# Patient Record
Sex: Female | Born: 1960
Health system: Southern US, Community
[De-identification: ages and names within clinical notes are randomized; demographics above are authoritative.]

## PROBLEM LIST (undated history)

## (undated) DIAGNOSIS — E559 Vitamin D deficiency, unspecified: Secondary | ICD-10-CM

## (undated) DIAGNOSIS — K589 Irritable bowel syndrome without diarrhea: Secondary | ICD-10-CM

## (undated) DIAGNOSIS — N200 Calculus of kidney: Secondary | ICD-10-CM

## (undated) DIAGNOSIS — M2550 Pain in unspecified joint: Secondary | ICD-10-CM

## (undated) DIAGNOSIS — Z87442 Personal history of urinary calculi: Secondary | ICD-10-CM

## (undated) DIAGNOSIS — M1990 Unspecified osteoarthritis, unspecified site: Secondary | ICD-10-CM

## (undated) DIAGNOSIS — R51 Headache: Secondary | ICD-10-CM

## (undated) DIAGNOSIS — F419 Anxiety disorder, unspecified: Secondary | ICD-10-CM

## (undated) DIAGNOSIS — I10 Essential (primary) hypertension: Secondary | ICD-10-CM

## (undated) DIAGNOSIS — Z860100 Personal history of colon polyps, unspecified: Secondary | ICD-10-CM

## (undated) DIAGNOSIS — R519 Headache, unspecified: Secondary | ICD-10-CM

## (undated) DIAGNOSIS — R42 Dizziness and giddiness: Secondary | ICD-10-CM

## (undated) DIAGNOSIS — K5900 Constipation, unspecified: Secondary | ICD-10-CM

## (undated) DIAGNOSIS — G4730 Sleep apnea, unspecified: Secondary | ICD-10-CM

## (undated) DIAGNOSIS — E7800 Pure hypercholesterolemia, unspecified: Secondary | ICD-10-CM

## (undated) DIAGNOSIS — K760 Fatty (change of) liver, not elsewhere classified: Secondary | ICD-10-CM

## (undated) DIAGNOSIS — R600 Localized edema: Secondary | ICD-10-CM

## (undated) DIAGNOSIS — N201 Calculus of ureter: Secondary | ICD-10-CM

## (undated) DIAGNOSIS — Z8719 Personal history of other diseases of the digestive system: Secondary | ICD-10-CM

## (undated) DIAGNOSIS — K219 Gastro-esophageal reflux disease without esophagitis: Secondary | ICD-10-CM

## (undated) HISTORY — DX: Calculus of kidney: N20.0

## (undated) HISTORY — DX: Constipation, unspecified: K59.00

## (undated) HISTORY — DX: Irritable bowel syndrome, unspecified: K58.9

## (undated) HISTORY — DX: Unspecified osteoarthritis, unspecified site: M19.90

## (undated) HISTORY — DX: Pure hypercholesterolemia, unspecified: E78.00

## (undated) HISTORY — DX: Pain in unspecified joint: M25.50

## (undated) HISTORY — PX: TUBAL LIGATION: SHX77

## (undated) HISTORY — DX: Vitamin D deficiency, unspecified: E55.9

## (undated) HISTORY — DX: Fatty (change of) liver, not elsewhere classified: K76.0

## (undated) HISTORY — DX: Dizziness and giddiness: R42

## (undated) HISTORY — DX: Anxiety disorder, unspecified: F41.9

## (undated) HISTORY — DX: Essential (primary) hypertension: I10

---
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
## 1984-06-04 HISTORY — PX: DILATION AND CURETTAGE OF UTERUS: SHX78

## 2002-08-14 ENCOUNTER — Encounter: Payer: Self-pay | Admitting: Family Medicine

## 2002-08-14 ENCOUNTER — Ambulatory Visit (HOSPITAL_COMMUNITY): Admission: RE | Admit: 2002-08-14 | Discharge: 2002-08-14 | Payer: Self-pay | Admitting: Family Medicine

## 2003-05-05 ENCOUNTER — Ambulatory Visit (HOSPITAL_COMMUNITY): Admission: RE | Admit: 2003-05-05 | Discharge: 2003-05-05 | Payer: Self-pay | Admitting: Obstetrics and Gynecology

## 2004-01-25 ENCOUNTER — Ambulatory Visit (HOSPITAL_COMMUNITY): Admission: RE | Admit: 2004-01-25 | Discharge: 2004-01-25 | Payer: Self-pay | Admitting: Obstetrics and Gynecology

## 2004-11-08 ENCOUNTER — Ambulatory Visit (HOSPITAL_COMMUNITY): Admission: RE | Admit: 2004-11-08 | Discharge: 2004-11-08 | Payer: Self-pay | Admitting: Obstetrics and Gynecology

## 2005-05-11 ENCOUNTER — Other Ambulatory Visit: Admission: RE | Admit: 2005-05-11 | Discharge: 2005-05-11 | Payer: Self-pay | Admitting: Obstetrics and Gynecology

## 2005-12-31 ENCOUNTER — Ambulatory Visit: Payer: Self-pay | Admitting: Internal Medicine

## 2006-03-14 ENCOUNTER — Ambulatory Visit: Payer: Self-pay | Admitting: Internal Medicine

## 2006-05-13 ENCOUNTER — Ambulatory Visit (HOSPITAL_COMMUNITY): Admission: RE | Admit: 2006-05-13 | Discharge: 2006-05-13 | Payer: Self-pay | Admitting: Obstetrics and Gynecology

## 2006-07-09 ENCOUNTER — Encounter: Payer: Self-pay | Admitting: Internal Medicine

## 2006-07-09 DIAGNOSIS — N200 Calculus of kidney: Secondary | ICD-10-CM | POA: Insufficient documentation

## 2011-03-05 ENCOUNTER — Encounter: Payer: Self-pay | Admitting: Cardiology

## 2011-03-26 ENCOUNTER — Encounter: Payer: Self-pay | Admitting: Cardiology

## 2011-04-03 ENCOUNTER — Encounter: Payer: Self-pay | Admitting: Cardiology

## 2011-04-05 ENCOUNTER — Ambulatory Visit (INDEPENDENT_AMBULATORY_CARE_PROVIDER_SITE_OTHER): Payer: 59 | Admitting: Cardiology

## 2011-04-05 ENCOUNTER — Encounter: Payer: Self-pay | Admitting: Cardiology

## 2011-04-05 DIAGNOSIS — J449 Chronic obstructive pulmonary disease, unspecified: Secondary | ICD-10-CM

## 2011-04-05 DIAGNOSIS — Z720 Tobacco use: Secondary | ICD-10-CM

## 2011-04-05 DIAGNOSIS — R0602 Shortness of breath: Secondary | ICD-10-CM

## 2011-04-05 DIAGNOSIS — N200 Calculus of kidney: Secondary | ICD-10-CM

## 2011-04-05 DIAGNOSIS — Z0189 Encounter for other specified special examinations: Secondary | ICD-10-CM

## 2011-04-05 DIAGNOSIS — F319 Bipolar disorder, unspecified: Secondary | ICD-10-CM

## 2011-04-05 DIAGNOSIS — K8020 Calculus of gallbladder without cholecystitis without obstruction: Secondary | ICD-10-CM | POA: Insufficient documentation

## 2011-04-05 DIAGNOSIS — R0609 Other forms of dyspnea: Secondary | ICD-10-CM | POA: Insufficient documentation

## 2011-04-05 DIAGNOSIS — D649 Anemia, unspecified: Secondary | ICD-10-CM

## 2011-04-05 DIAGNOSIS — F17200 Nicotine dependence, unspecified, uncomplicated: Secondary | ICD-10-CM

## 2011-04-05 DIAGNOSIS — I10 Essential (primary) hypertension: Secondary | ICD-10-CM | POA: Insufficient documentation

## 2011-04-05 NOTE — Progress Notes (Signed)
HPI: Ms. Nielson is evaluated in the office today at the kind request of Dr. Sherril Croon for evaluation of dyspnea and echocardiographic evidence of pulmonary hypertension.  This nice woman has enjoyed generally excellent health.  She was diagnosed with mild hypertension some years ago, which has been well controlled with diuretic therapy alone.  She's had some pedal edema but no orthopnea nor PND.  In recent months, she has reported progressive dyspnea on exertion, a component of which was pre-existing.  She is able to do her housework, but notes some dyspnea with yard work or one flight of stairs.  A recent echocardiogram suggested mild pulmonary hypertension, but no other right heart abnormalities.  Abdominal ultrasound has demonstrated the presence of cholelithiasis, nephrolithiasis and hepatosteatosis.   Menopause occurred approximately 6 years ago.  Mild vasomotor symptoms have improved since then without treatment.  Current Outpatient Prescriptions on File Prior to Visit  Medication Sig Dispense Refill  . Multiple Vitamin (MULTIVITAMIN) tablet Take 1 tablet by mouth daily.          No Known Allergies    Past Medical History  Diagnosis Date  . Nephrolithiasis   . Cholelithiasis   . Dyspnea on exertion     lower extremity edema  . Hypertension 2006    onset in 2006; controlled with single drug     Past Surgical History  Procedure Date  . Cesarean section   . Tubal ligation   . Tonsillectomy      No family history on file.   History   Social History  . Marital Status: Single    Spouse Name: N/A    Number of Children: 2  . Years of Education: N/A   Occupational History  . 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 Lionel December   Social History Main Topics  . Smoking status: Former Smoker -- 0.3 packs/day for 3 years  . Smokeless tobacco: Never Used  . Alcohol Use: 0.5 oz/week    1 drink(s) per week  . Drug Use: Not on file  . Sexually Active: Not on file   Other Topics  Concern  . Not on file   Social History Narrative  . No narrative on file     ROS: Intermittent headaches with mild dizziness; corrective lenses required; occasional palpitations; long-standing obesity with weight as high as 260, subsequently falling to 190 and now back to 250.  Mild arthritic discomfort in the right knee; intermittent ankle edema.     All other systems reviewed and are negative.  PHYSICAL EXAM: BP 132/85  Pulse 76  Resp 18  Ht 5\' 7"  (1.702 m)  Wt 114.306 kg (252 lb)  BMI 39.47 kg/m2  General-Well-developed; no acute distress Body Habitus-Overweight HEENT-[place]/AT; PERRL; EOM intact; conjunctiva and lids nl Neck-No JVD; no carotid bruits Endocrine-mild thyromegaly Lungs-Clear lung fields; resonant percussion; normal I-to-E ratio Cardiovascular- normal PMI; normal S1 and S2 Abdomen-BS normal; soft and non-tender without masses or organomegaly Musculoskeletal-No deformities, cyanosis or clubbing Neurologic-Nl cranial nerves; symmetric strength and tone Skin- Warm, no significant lesions Extremities-Nl distal pulses; trace edema  EKG:  Tracing obtained 02/23/11: Normal sinus rhythm; low voltage in the chest leads; otherwise normal.  ASSESSMENT AND PLAN:

## 2011-04-05 NOTE — Assessment & Plan Note (Addendum)
Echocardiogram in 03/2011 by [facility]-technically difficult, mild LVH, normal EF, normal RA and RV, mild pulmonary hypertension-38 mmHg EKG performed 02/23/11: normal sinus rhythm, low voltage in the chest leads, otherwise normal.  Event recorder-no arrhythmias  Echocardiogram suggests only mild pulmonary hypertension by Doppler without additional imaging findings indicating increased right heart pressure.  She has no apparent cause for pulmonary hypertension other than excessive weight and no physical or EKG  findings to suggest that diagnosis.  Exercise tolerance is only modestly impaired, most likely as the result of her weight and physical deconditioning.  Patient has been told that she snores rarely, and her husband has noted no disordered breathing.  Basic laboratory studies including a TSH and BNP level will be obtained and echocardiographic images requested to permit review.  We will locate her most recent chest x-ray.  I will reassess this nice woman 3 weeks.  Until then, she is advised to continue to restrict calories and to exercise as tolerated.

## 2011-04-05 NOTE — Patient Instructions (Signed)
Your physician recommends that you schedule a follow-up appointment in: 3 weeks with Dr Dietrich Pates  Your physician recommends that you return for lab work in: Tomorrow (CMET, CBC, ESR, Fasting Lipids)  Begin an exercise regimen

## 2011-04-06 ENCOUNTER — Ambulatory Visit: Payer: Self-pay | Admitting: Cardiology

## 2011-04-07 NOTE — Assessment & Plan Note (Signed)
onset in 2006; adequately controlled with a single drug

## 2011-04-10 LAB — CBC
HCT: 44.9 % (ref 36.0–46.0)
Hemoglobin: 14.7 g/dL (ref 12.0–15.0)
MCH: 29.9 pg (ref 26.0–34.0)
MCHC: 32.7 g/dL (ref 30.0–36.0)
MCV: 91.4 fL (ref 78.0–100.0)
Platelets: 195 10*3/uL (ref 150–400)
RBC: 4.91 MIL/uL (ref 3.87–5.11)
RDW: 13.3 % (ref 11.5–15.5)
WBC: 4.5 10*3/uL (ref 4.0–10.5)

## 2011-04-10 LAB — BRAIN NATRIURETIC PEPTIDE: Brain Natriuretic Peptide: 44.4 pg/mL (ref 0.0–100.0)

## 2011-04-10 LAB — COMPREHENSIVE METABOLIC PANEL
ALT: 30 U/L (ref 0–35)
AST: 21 U/L (ref 0–37)
Albumin: 4.4 g/dL (ref 3.5–5.2)
Alkaline Phosphatase: 67 U/L (ref 39–117)
BUN: 12 mg/dL (ref 6–23)
CO2: 25 mEq/L (ref 19–32)
Calcium: 9.2 mg/dL (ref 8.4–10.5)
Chloride: 108 mEq/L (ref 96–112)
Creat: 0.77 mg/dL (ref 0.50–1.10)
Glucose, Bld: 104 mg/dL — ABNORMAL HIGH (ref 70–99)
Potassium: 4.4 mEq/L (ref 3.5–5.3)
Sodium: 142 mEq/L (ref 135–145)
Total Bilirubin: 0.4 mg/dL (ref 0.3–1.2)
Total Protein: 6.4 g/dL (ref 6.0–8.3)

## 2011-04-10 LAB — LIPID PANEL
Cholesterol: 136 mg/dL (ref 0–200)
HDL: 40 mg/dL (ref >39)
LDL Cholesterol: 82 mg/dL (ref 0–99)
Total CHOL/HDL Ratio: 3.4 Ratio
Triglycerides: 72 mg/dL (ref <150)
VLDL: 14 mg/dL (ref 0–40)

## 2011-04-10 LAB — SEDIMENTATION RATE: Sed Rate: 4 mm/hr (ref 0–22)

## 2011-04-10 LAB — TSH: TSH: 2.141 u[IU]/mL (ref 0.350–4.500)

## 2011-04-21 ENCOUNTER — Encounter: Payer: Self-pay | Admitting: Cardiology

## 2011-04-23 ENCOUNTER — Other Ambulatory Visit (INDEPENDENT_AMBULATORY_CARE_PROVIDER_SITE_OTHER): Payer: Self-pay | Admitting: *Deleted

## 2011-04-23 DIAGNOSIS — Z1211 Encounter for screening for malignant neoplasm of colon: Secondary | ICD-10-CM

## 2011-04-23 DIAGNOSIS — K219 Gastro-esophageal reflux disease without esophagitis: Secondary | ICD-10-CM

## 2011-04-23 DIAGNOSIS — R1013 Epigastric pain: Secondary | ICD-10-CM

## 2011-04-30 ENCOUNTER — Ambulatory Visit (INDEPENDENT_AMBULATORY_CARE_PROVIDER_SITE_OTHER): Payer: 59 | Admitting: Cardiology

## 2011-04-30 ENCOUNTER — Encounter: Payer: Self-pay | Admitting: Cardiology

## 2011-04-30 ENCOUNTER — Encounter (INDEPENDENT_AMBULATORY_CARE_PROVIDER_SITE_OTHER): Payer: Self-pay | Admitting: *Deleted

## 2011-04-30 ENCOUNTER — Telehealth (INDEPENDENT_AMBULATORY_CARE_PROVIDER_SITE_OTHER): Payer: Self-pay | Admitting: *Deleted

## 2011-04-30 DIAGNOSIS — E8881 Metabolic syndrome: Secondary | ICD-10-CM

## 2011-04-30 DIAGNOSIS — I10 Essential (primary) hypertension: Secondary | ICD-10-CM

## 2011-04-30 NOTE — Progress Notes (Signed)
Patient ID: Marilyn Martin, female   DOB: Apr 14, 1961, 50 y.o.   MRN: [id number] HPI: Marilyn Martin returns to the office as scheduled for continued assessment and treatment of peripheral edema and dyspnea on exertion.  Since her last visit, she has done extremely well.  She joined [employer] and has lost more than 10 pounds.  She has increased exercise to some extent and notes some decrease in exertional dyspnea.  Peripheral edema has been stable, requiring use of furosemide nearly daily.  Prior to Admission medications   Medication Sig Start Date End Date Taking? Authorizing Provider  furosemide (LASIX) 20 MG tablet Take 20 mg by mouth as needed.     Yes Historical Provider, MD  Multiple Vitamin (MULTIVITAMIN) tablet Take 1 tablet by mouth daily.     Yes Historical Provider, MD  triamterene-hydrochlorothiazide (DYAZIDE) 37.5-25 MG per capsule Take 1 capsule by mouth every morning.     Yes Historical Provider, MD  No Known Allergies    Past medical history, social history, and family history reviewed and updated.  ROS: Denies orthopnea and PND.  No cough nor sputum production.  No lightheadedness nor syncope.  PHYSICAL EXAM: BP 122/80  Pulse 80  Ht 5\' 7"  (1.702 m)  Wt 110.224 kg (243 lb)  BMI 38.06 kg/m2  General-Well developed; no acute distress Body habitus-overweight Neck-No JVD; no carotid bruits Lungs-clear lung fields; resonant to percussion Cardiovascular-normal PMI; normal S1 and S2 Abdomen-normal bowel sounds; soft and non-tender without masses or organomegaly Musculoskeletal-No deformities, no cyanosis or clubbing Neurologic-Normal cranial nerves; symmetric strength and tone Skin-Warm, no significant lesions Extremities-distal pulses intact; 1/2+ ankle edema on the right; trace on the left.  ASSESSMENT AND PLAN:  Marilyn Bing, MD 04/30/2011 11:58 AM  And hehe is in in his in her heris aheis is ishe he E.

## 2011-04-30 NOTE — Telephone Encounter (Signed)
PCP/Requesting MD:  Sherril Croon  Name: Marilyn Martin  DOB: [birth year]  Home Phone: [phone]      Procedure: TCS  Reason/Indication:  SCREENING, + FH CRC  Has patient had this procedure before?  NO  If so, when, by whom and where?    Is there a family history of colon cancer?  YES, GRANDFATHER, UNCLE  Who?  What age when diagnosed?    Is patient diabetic?   NO      Does patient have prosthetic heart valve?  NO  Do you have a pacemaker?  NO  Has patient had joint replacement within last 12 months?  NO  Is patient on Coumadin, Plavix and/or Aspirin? NO  Medications: FUROSEMIDE 20 MG DAILY PRN, MULTI VIT DAILY, DYAZIDE 37.5/25 MG DAILY  Allergies: NKDA  Pharmacy:   Medication Adjustment: NONE  Procedure date & time: 05/18/11 @ 7:30

## 2011-04-30 NOTE — Assessment & Plan Note (Addendum)
Although lipids are excellent, with obesity, hypertension and hyperglycemia, she meets criteria for metabolic syndrome.  It is likely that this will resolve should she continue to be successful in her weight reduction efforts.  Pedal edema is likely related to venous insufficiency and obesity.  Edema has been present for some time; a negative venous ultrasound study was performed approximately 2 years ago.  Leg elevation and salt restriction recommended.  Process appears too mild at present to justify the use of prescription compression stockings.

## 2011-04-30 NOTE — Patient Instructions (Signed)
Your physician recommends that you schedule a follow-up appointment in: 6 months  Your physician recommends that you continue weight watchers and increase your exercise.

## 2011-04-30 NOTE — Assessment & Plan Note (Addendum)
Patient has improved symptomatically with weight loss and increased exercise.  She is congratulated on her efforts to date and encouraged to continue to restrict caloric intake while increasing her activity.  Although her echocardiogram is suggestive of mild pulmonary hypertension, this may well be due to obesity.  Additional testing is not likely to be particularly helpful at the present time.  Once weight stabilizes, an arterial blood gas, PFTs and even a right heart catheterization may be appropriate.

## 2011-04-30 NOTE — Assessment & Plan Note (Signed)
Blood pressure control is excellent with diuretic therapy alone.  Recent metabolic profile was normal except for slightly elevated glucose.  She will require monitoring of electrolytes and renal function at least twice a year.

## 2011-05-01 ENCOUNTER — Encounter: Payer: Self-pay | Admitting: Cardiology

## 2011-05-01 NOTE — Telephone Encounter (Signed)
Agree with colonoscopy.

## 2011-05-14 ENCOUNTER — Encounter (HOSPITAL_COMMUNITY): Payer: Self-pay | Admitting: Pharmacy Technician

## 2011-05-17 MED ORDER — SODIUM CHLORIDE 0.45 % IV SOLN: Freq: Once | INTRAVENOUS | Status: DC

## 2011-05-18 ENCOUNTER — Encounter (HOSPITAL_COMMUNITY): Admission: RE | Payer: Self-pay | Source: Ambulatory Visit

## 2011-05-18 ENCOUNTER — Ambulatory Visit (HOSPITAL_COMMUNITY): Admission: RE | Admit: 2011-05-18 | Payer: 59 | Source: Ambulatory Visit | Admitting: Internal Medicine

## 2011-05-18 SURGERY — COLONOSCOPY WITH ESOPHAGOGASTRODUODENOSCOPY (EGD)
Anesthesia: Moderate Sedation

## 2012-03-12 ENCOUNTER — Other Ambulatory Visit (INDEPENDENT_AMBULATORY_CARE_PROVIDER_SITE_OTHER): Payer: Self-pay | Admitting: *Deleted

## 2012-03-12 DIAGNOSIS — Z1211 Encounter for screening for malignant neoplasm of colon: Secondary | ICD-10-CM

## 2012-03-12 DIAGNOSIS — R12 Heartburn: Secondary | ICD-10-CM

## 2012-03-12 DIAGNOSIS — R1310 Dysphagia, unspecified: Secondary | ICD-10-CM

## 2012-03-12 DIAGNOSIS — R1013 Epigastric pain: Secondary | ICD-10-CM

## 2012-04-08 ENCOUNTER — Telehealth (INDEPENDENT_AMBULATORY_CARE_PROVIDER_SITE_OTHER): Payer: Self-pay | Admitting: *Deleted

## 2012-04-08 ENCOUNTER — Encounter (INDEPENDENT_AMBULATORY_CARE_PROVIDER_SITE_OTHER): Payer: Self-pay | Admitting: *Deleted

## 2012-04-08 NOTE — Telephone Encounter (Signed)
agree

## 2012-04-08 NOTE — Telephone Encounter (Signed)
PCP/Requesting MD: vyas  Name & DOB: aneesah hernan Sep 22, 1960   Procedure: tcs/egd/ed  Reason/Indication:  Screening, epigastric pain, heartburn, dysphagia  Has patient had this procedure before?  no  If so, when, by whom and where?    Is there a family history of colon cancer?    Who?  What age when diagnosed?    Is patient diabetic?   no      Does patient have prosthetic heart valve?  no  Do you have a pacemaker?  no  Has patient had joint replacement within last 12 months?  no  Is patient on Coumadin, Plavix and/or Aspirin? no  Medications: lasix 20 mg daily, triamterene/hctz 37.5/25 mg daily, multi vit  Allergies:see EPIC  Medication Adjustment:   Procedure date & time: 04/25/12 at 730

## 2012-04-14 ENCOUNTER — Encounter (HOSPITAL_COMMUNITY): Payer: Self-pay | Admitting: Pharmacy Technician

## 2012-04-25 ENCOUNTER — Encounter (HOSPITAL_COMMUNITY)
Admission: RE | Disposition: A | Discharge status: Home / Self Care | Payer: Self-pay | Source: Ambulatory Visit | Attending: Internal Medicine

## 2012-04-25 ENCOUNTER — Encounter (HOSPITAL_COMMUNITY): Payer: Self-pay | Admitting: *Deleted

## 2012-04-25 ENCOUNTER — Ambulatory Visit (HOSPITAL_COMMUNITY)
Admission: RE | Admit: 2012-04-25 | Discharge: 2012-04-25 | Disposition: A | Discharge status: Home / Self Care | Payer: 59 | Source: Ambulatory Visit | Attending: Internal Medicine | Admitting: Internal Medicine

## 2012-04-25 DIAGNOSIS — R1013 Epigastric pain: Secondary | ICD-10-CM

## 2012-04-25 DIAGNOSIS — K5730 Diverticulosis of large intestine without perforation or abscess without bleeding: Secondary | ICD-10-CM | POA: Insufficient documentation

## 2012-04-25 DIAGNOSIS — Z8719 Personal history of other diseases of the digestive system: Secondary | ICD-10-CM

## 2012-04-25 DIAGNOSIS — Z1211 Encounter for screening for malignant neoplasm of colon: Secondary | ICD-10-CM

## 2012-04-25 DIAGNOSIS — K644 Residual hemorrhoidal skin tags: Secondary | ICD-10-CM | POA: Insufficient documentation

## 2012-04-25 DIAGNOSIS — K2990 Gastroduodenitis, unspecified, without bleeding: Secondary | ICD-10-CM

## 2012-04-25 DIAGNOSIS — K6389 Other specified diseases of intestine: Secondary | ICD-10-CM

## 2012-04-25 DIAGNOSIS — R1310 Dysphagia, unspecified: Secondary | ICD-10-CM

## 2012-04-25 DIAGNOSIS — R12 Heartburn: Secondary | ICD-10-CM

## 2012-04-25 DIAGNOSIS — K2970 Gastritis, unspecified, without bleeding: Secondary | ICD-10-CM | POA: Insufficient documentation

## 2012-04-25 DIAGNOSIS — D126 Benign neoplasm of colon, unspecified: Secondary | ICD-10-CM

## 2012-04-25 DIAGNOSIS — I10 Essential (primary) hypertension: Secondary | ICD-10-CM | POA: Insufficient documentation

## 2012-04-25 DIAGNOSIS — K2100 Gastro-esophageal reflux disease with esophagitis, without bleeding: Secondary | ICD-10-CM | POA: Insufficient documentation

## 2012-04-25 DIAGNOSIS — K259 Gastric ulcer, unspecified as acute or chronic, without hemorrhage or perforation: Secondary | ICD-10-CM

## 2012-04-25 HISTORY — PX: MALONEY DILATION: SHX5535

## 2012-04-25 HISTORY — PX: COLONOSCOPY WITH ESOPHAGOGASTRODUODENOSCOPY (EGD): SHX5779

## 2012-04-25 HISTORY — DX: Gastro-esophageal reflux disease without esophagitis: K21.9

## 2012-04-25 SURGERY — COLONOSCOPY WITH ESOPHAGOGASTRODUODENOSCOPY (EGD)
Anesthesia: Moderate Sedation

## 2012-04-25 MED ORDER — PANTOPRAZOLE SODIUM 40 MG PO TBEC: 40.0000 mg | DELAYED_RELEASE_TABLET | Freq: Every day | ORAL | Status: DC

## 2012-04-25 MED ORDER — MIDAZOLAM HCL 5 MG/5ML IJ SOLN
INTRAMUSCULAR | Status: DC | PRN
  Administered 2012-04-25 (×2): 2 mg via INTRAVENOUS
  Administered 2012-04-25 (×2): 1 mg via INTRAVENOUS
  Administered 2012-04-25 (×2): 2 mg via INTRAVENOUS

## 2012-04-25 MED ORDER — BUTAMBEN-TETRACAINE-BENZOCAINE 2-2-14 % EX AERO
INHALATION_SPRAY | CUTANEOUS | Status: DC | PRN
  Administered 2012-04-25: 2 via TOPICAL

## 2012-04-25 MED ORDER — MEPERIDINE HCL 50 MG/ML IJ SOLN
INTRAMUSCULAR | Status: AC
  Filled 2012-04-25: qty 1

## 2012-04-25 MED ORDER — STERILE WATER FOR IRRIGATION IR SOLN
Status: DC | PRN
  Administered 2012-04-25: 08:00:00

## 2012-04-25 MED ORDER — MEPERIDINE HCL 50 MG/ML IJ SOLN
INTRAMUSCULAR | Status: DC | PRN
  Administered 2012-04-25 (×2): 25 mg via INTRAVENOUS

## 2012-04-25 MED ORDER — MIDAZOLAM HCL 5 MG/5ML IJ SOLN
INTRAMUSCULAR | Status: AC
  Filled 2012-04-25: qty 10

## 2012-04-25 MED ORDER — SODIUM CHLORIDE 0.45 % IV SOLN
INTRAVENOUS | Status: DC
  Administered 2012-04-25: 07:00:00 via INTRAVENOUS

## 2012-04-25 NOTE — H&P (Addendum)
Marilyn Martin is an 51 y.o. female.   Chief Complaint: Patient is here for EGD, ED and colonoscopy. HPI: Patient is 51 year-old Caucasian female who presents with one-year history of intermittent dysphagia to solids and heartburn 2-3 times a week. She has good appetite. She denies nausea vomiting or melena but has intermittent epigastric pain. She is also undergoing screening colonoscopy. She has intermittent constipation; no history of rectal bleeding. Family history significant for colon carcinoma paternal uncle who was in his 52s.  Past Medical History  Diagnosis Date  . Nephrolithiasis   . Cholelithiasis   . Dyspnea on exertion     lower extremity edema  . Hypertension 2006    onset in 2006; controlled with single drug  . GERD (gastroesophageal reflux disease)     Past Surgical History  Procedure Date  . Cesarean section   . Tubal ligation   . Tonsillectomy     Family History  Problem Relation Age of Onset  . Stroke Father   . Heart attack Father   . Alzheimer's disease Mother   . Diabetes Brother   . Alzheimer's disease Sister   . Colon cancer Paternal Uncle    Social History:  reports that she has quit smoking. Her smoking use included Cigarettes. She has a 2 pack-year smoking history. She has never used smokeless tobacco. She reports that she drinks alcohol. She reports that she does not use illicit drugs.  Allergies:  Allergies  Allergen Reactions  . Bee Venom Swelling    Medications Prior to Admission  Medication Sig Dispense Refill  . acetaminophen (TYLENOL) 500 MG tablet Take 1,000 mg by mouth every 6 (six) hours as needed. For pain/fever       . Cholecalciferol (VITAMIN D) 2000 UNITS tablet Take 2,000 Units by mouth 3 (three) times a week.      . estradiol-norethindrone (COMBIPATCH) 0.05-0.14 MG/DAY Place 1 patch onto the skin 2 (two) times a week.      . furosemide (LASIX) 20 MG tablet Take 20 mg by mouth daily.       [name] Kitchen loratadine (CLARITIN) 10 MG tablet  Take 10 mg by mouth daily.      . Multiple Vitamins-Minerals (MULTIVITAMINS THER. W/MINERALS) TABS Take 1 tablet by mouth daily.        [name] Kitchen triamterene-hydrochlorothiazide (DYAZIDE) 37.5-25 MG per capsule Take 1 capsule by mouth every morning.          No results found for this or any previous visit (from the past 48 hour(s)). No results found.  ROS  Blood pressure 141/92, pulse 91, temperature 98.4 F (36.9 C), temperature source Oral, resp. rate 20, height 5\' 7"  (1.702 m), weight 250 lb (113.399 kg), SpO2 97.00%. Physical Exam  Constitutional: She appears well-developed and well-nourished.  HENT:  Mouth/Throat: Oropharynx is clear and moist.  Eyes: Conjunctivae normal are normal. No scleral icterus.  Neck: No thyromegaly present.  Cardiovascular: Normal rate, regular rhythm and normal heart sounds.   No murmur heard. Respiratory: Effort normal.  GI: Soft. She exhibits no distension and no mass. There is no tenderness.  Musculoskeletal: Edema: trace edema around ankles.  Lymphadenopathy:    She has no cervical adenopathy.  Skin: Skin is warm and dry.     Assessment/Plan Solid food dysphagia. EGD, ED and average risk screening colonoscopy  Marilyn Martin 04/25/2012, 7:32 AM

## 2012-04-25 NOTE — Op Note (Addendum)
EGD, ED AND  COLONOSCOPY  PROCEDURE REPORT  PATIENT:  Marilyn Martin  MR#:  [id number] Birthdate:  1961-03-02, 51 y.o., female Endoscopist:  Dr. Malissa Hippo, MD Referred By:  Dr. Ignatius Specking, MD Procedure Date: 04/25/2012  Procedure:   EGD, ED & Colonoscopy  Indications:  Patient is 51 year old Caucasian female who presents with intermittent heartburn and solid food dysphagia. She is also undergoing screening colonoscopy.            Informed Consent:  The risks, benefits, alternatives & imponderables which include, but are not limited to, bleeding, infection, perforation, drug reaction and potential missed lesion have been reviewed.  The potential for biopsy, lesion removal, esophageal dilation, etc. have also been discussed.  Questions have been answered.  All parties agreeable.  Please see history & physical in medical record for more information.  Medications:  Demerol 50 mg IV Versed 10 mg IV Cetacaine spray topically for oropharyngeal anesthesia  EGD  Description of procedure:  The endoscope was introduced through the mouth and advanced to the second portion of the duodenum without difficulty or limitations. The mucosal surfaces were surveyed very carefully during advancement of the scope and upon withdrawal.  Findings:  Esophagus:  Mucosa of the esophagus was normal. Single small ulcer noted at GE junction. GEJ:  37 cm Stomach:  Stomach was empty and distended very well with insufflation. Folds in the proximal stomach are normal. Examination mucosa revealed single erosion and gastric body and few more at antrum along with the scar. Pyloric channel was patent. Angularis fundus and cardia were examined by retroflexing the scope and were normal. Duodenum:  Patchy edema and erythema noted at bulb. Post bulbar mucosa was normal.  Therapeutic/Diagnostic Maneuvers Performed:  Esophagus dilated by passing 56 French Maloney dilator to full insertion but no mucosal disruption  noted.  COLONOSCOPY Description of procedure:  After a digital rectal exam was performed, that colonoscope was advanced from the anus through the rectum and colon to the area of the cecum, ileocecal valve and appendiceal orifice. The cecum was deeply intubated. These structures were well-seen and photographed for the record. From the level of the cecum and ileocecal valve, the scope was slowly and cautiously withdrawn. The mucosal surfaces were carefully surveyed utilizing scope tip to flexion to facilitate fold flattening as needed. The scope was pulled down into the rectum where a thorough exam including retroflexion was performed.  Findings:   Prep satisfactory. Two small diverticula noted(transverse and sigmoid colon). Four small polyps ablated via cold biopsy. One was located at splenic flexure and others 3 at sigmoid colon. All of these polyps are submitted together. Normal rectal mucosa. Small hemorrhoids below the dentate line and single anal papilla. Therapeutic/Diagnostic Maneuvers Performed:  See above  Complications:  None  Cecal Withdrawal Time: 17 minutes  Impression:  Single small ulcer noted at GE junction(reflux esophagitis). No evidence of ring or stricture. Esophagus dilated by passing 56 French Maloney dilator given history of dysphagia. Gastroduodenitis and a small antral scar. Two small diverticula noted(transverse and sigmoid colon). Four small polyps ablated via cold biopsy and submitted in one container. Mall external hemorrhoids.  Recommendations:  Anti-reflux measures. Pantoprazole 40 mg by mouth every morning. H. pylori serology.  REHMAN,NAJEEB U  04/25/2012 8:34 AM  CC: Dr. Ignatius Specking., MD & Dr. Bonnetta Barry ref. provider found

## 2012-04-28 ENCOUNTER — Encounter (HOSPITAL_COMMUNITY): Payer: Self-pay | Admitting: Internal Medicine

## 2012-04-28 LAB — H. PYLORI ANTIBODY, IGG: H Pylori IgG: <0.4 {ISR}

## 2012-04-30 ENCOUNTER — Encounter (INDEPENDENT_AMBULATORY_CARE_PROVIDER_SITE_OTHER): Payer: Self-pay | Admitting: *Deleted

## 2012-09-03 ENCOUNTER — Telehealth (INDEPENDENT_AMBULATORY_CARE_PROVIDER_SITE_OTHER): Payer: Self-pay | Admitting: Internal Medicine

## 2012-09-03 MED ORDER — FUROSEMIDE 20 MG PO TABS: 20.0000 mg | ORAL_TABLET | Freq: Every day | ORAL | Status: DC

## 2012-09-03 NOTE — Telephone Encounter (Signed)
Rx sent 

## 2012-12-16 ENCOUNTER — Encounter: Payer: Self-pay | Admitting: Cardiovascular Disease

## 2012-12-16 ENCOUNTER — Ambulatory Visit (INDEPENDENT_AMBULATORY_CARE_PROVIDER_SITE_OTHER): Payer: 59 | Admitting: Cardiovascular Disease

## 2012-12-16 ENCOUNTER — Encounter: Payer: Self-pay | Admitting: *Deleted

## 2012-12-16 VITALS — BP 113/76 | HR 83 | Ht 66.0 in | Wt 265.0 lb

## 2012-12-16 DIAGNOSIS — R079 Chest pain, unspecified: Secondary | ICD-10-CM

## 2012-12-16 DIAGNOSIS — R0602 Shortness of breath: Secondary | ICD-10-CM

## 2012-12-16 DIAGNOSIS — M7989 Other specified soft tissue disorders: Secondary | ICD-10-CM

## 2012-12-16 DIAGNOSIS — I10 Essential (primary) hypertension: Secondary | ICD-10-CM

## 2012-12-16 NOTE — Patient Instructions (Addendum)
Your physician has requested that you have an echocardiogram. Echocardiography is a painless test that uses sound waves to create images of your heart. It provides your doctor with information about the size and shape of your heart and how well your heart's chambers and valves are working. This procedure takes approximately one hour. There are no restrictions for this procedure. Lexiscan cardiolite (stress test) Office will contact with results via phone or letter.   Continue all current medications. Follow up in  6-8 weeks

## 2012-12-16 NOTE — Progress Notes (Signed)
Patient ID: Marilyn Martin, female   DOB: 1960-07-31, 52 y.o.   MRN: [id number]    CARDIOLOGY CONSULT NOTE  Patient ID: Marilyn Martin MRN: [id number] DOB/AGE: [birth year] 52 y.o.  Admit date: (Not on file) Primary Physician: Doreen Beam, MD Reason for Consultation: shortness of breath, leg edema  HPI:  Marilyn Martin is a 52 y.o. Woman with metabolic syndrome, peripheral edema, and chronic dyspnea, deemed secondary to obesity. She has HTN and takes Dyazide.  She's been taking Furosemide for leg swelling, and says her right foot has been more swollen lately. She was told by Dr. Sherril Croon' NP that she was in right-heart failure, and is thus here today.  She denies exertional chest pain, but does have exertional dyspnea and sometimes at rest as well.  She denies ever having had PFT's, stress testing, or right heart catheterization. She did have an Event monitor for palpitations in 2012.  An echocardiogram in October 2012 showed normal LV systolic and diastolic function, mild LVH, mild LAE, and mild pulmonary HTN.  She's never been to a vascular surgeon for venous or lymphatic evaluation.  Her husband denies that she has any significant snoring nor apneic episodes.  Review of systems complete and found to be negative unless listed above in HPI  Past Medical History: See HPI  Family History  Problem Relation Age of Onset  . Stroke Father   . Heart attack Father   . Alzheimer's disease Mother   . Diabetes Brother   . Alzheimer's disease Sister   . Colon cancer Paternal Uncle     History   Social History  . Marital Status: Married    Spouse Name: N/A    Number of Children: 2  . Years of Education: N/A   Occupational History  . 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 Smoking status: Former Smoker -- 0.50 packs/day for 4 years    Types: Cigarettes  . Smokeless tobacco: Never Used  . Alcohol Use: 0.0 oz/week     Comment: Rarely   . Drug Use: No  . Sexually Active: Not on file   Other Topics Concern  . Not on file   Social History Narrative  . No narrative on file      (Not in a hospital admission)  Physical exam Height 5\' 6"  (1.676 m), weight 265 lb (120.203 kg). General: NAD Neck: No JVD, no thyromegaly or thyroid nodule.  Lungs: Clear to auscultation bilaterally with normal respiratory effort. CV: Nondisplaced PMI.  Heart regular S1/S2, no S3/S4, no murmur.  1+ pitting pretibial edema bilaterally.  No carotid bruit.  Normal pedal pulses.  Abdomen: Soft, nontender, no hepatosplenomegaly, no distention.  Skin: Intact without lesions or rashes.  Neurologic: Alert and oriented x 3.  Psych: Normal affect. Extremities: No clubbing or cyanosis.  HEENT: Normal.   Labs:   Lab Results  Component Value Date   WBC 4.5 04/09/2011   HGB 14.7 04/09/2011   HCT 44.9 04/09/2011   MCV 91.4 04/09/2011   PLT 195 04/09/2011   No results found for this basename: NA, K, CL, CO2, BUN, CREATININE, CALCIUM, LABALBU, PROT, BILITOT, ALKPHOS, ALT, AST, GLUCOSE,  in the last 168 hours No results found for this basename: CKTOTAL, CKMB, CKMBINDEX, TROPONINI    Lab Results  Component Value Date   CHOL 136 04/09/2011   Lab Results  Component Value Date   HDL 40 04/09/2011   Lab Results  Component Value Date   LDLCALC 82 04/09/2011   Lab Results  Component Value Date   TRIG 72 04/09/2011   Lab Results  Component Value Date   CHOLHDL 3.4 04/09/2011   No results found for this basename: LDLDIRECT       EKG: Sinus rhythm, rate 89 bpm, axis within normal limits, intervals within normal limits, no acute ST-T wave changes.    ASSESSMENT AND PLAN:  1. Shortness of breath: this may very well be due to deconditioning due to obesity. However, to assess for any interval changes in her pulmonary pressures as well as diastolic function, will proceed with an echocardiogram. Secondly, to make sure we are not overlooking an anginal  equivalent given her metabolic syndrome, will obtain a Lexiscan Cardiolite stress test. If these are unrevealing, I would obtain PFT's to assess for a pulmonary etiology as well as a referral to a vascular surgeon, as her concomitant leg swelling may be due to venous (and/or lymphatic) insufficiency. 2. Leg edema: see #1. 3. HTN: controlled.  Signed: Prentice Docker, M.D., F.A.C.C. 12/16/2012, 8:42 AM

## 2012-12-26 ENCOUNTER — Encounter (HOSPITAL_COMMUNITY): Payer: 59

## 2012-12-26 ENCOUNTER — Encounter (HOSPITAL_COMMUNITY): Payer: Self-pay

## 2012-12-26 ENCOUNTER — Encounter (HOSPITAL_COMMUNITY)
Admission: RE | Admit: 2012-12-26 | Discharge: 2012-12-26 | Disposition: A | Discharge status: Home / Self Care | Payer: 59 | Source: Ambulatory Visit | Attending: Cardiovascular Disease | Admitting: Cardiovascular Disease

## 2012-12-26 ENCOUNTER — Ambulatory Visit (HOSPITAL_COMMUNITY)
Admission: RE | Admit: 2012-12-26 | Discharge: 2012-12-26 | Disposition: A | Discharge status: Home / Self Care | Payer: 59 | Source: Ambulatory Visit | Attending: Cardiovascular Disease | Admitting: Cardiovascular Disease

## 2012-12-26 ENCOUNTER — Other Ambulatory Visit: Payer: Self-pay | Admitting: Cardiovascular Disease

## 2012-12-26 DIAGNOSIS — R079 Chest pain, unspecified: Secondary | ICD-10-CM

## 2012-12-26 DIAGNOSIS — R0602 Shortness of breath: Secondary | ICD-10-CM

## 2012-12-26 DIAGNOSIS — M7989 Other specified soft tissue disorders: Secondary | ICD-10-CM

## 2012-12-26 DIAGNOSIS — I10 Essential (primary) hypertension: Secondary | ICD-10-CM | POA: Insufficient documentation

## 2012-12-26 DIAGNOSIS — R0609 Other forms of dyspnea: Secondary | ICD-10-CM | POA: Insufficient documentation

## 2012-12-26 DIAGNOSIS — R0989 Other specified symptoms and signs involving the circulatory and respiratory systems: Secondary | ICD-10-CM | POA: Insufficient documentation

## 2012-12-26 HISTORY — PX: CARDIOVASCULAR STRESS TEST: SHX262

## 2012-12-26 HISTORY — PX: TRANSTHORACIC ECHOCARDIOGRAM: SHX275

## 2012-12-26 MED ORDER — TECHNETIUM TC 99M SESTAMIBI - CARDIOLITE
10.0000 | Freq: Once | INTRAVENOUS | Status: AC | PRN
  Administered 2012-12-26: 10:00:00 10 via INTRAVENOUS

## 2012-12-26 MED ORDER — TECHNETIUM TC 99M SESTAMIBI - CARDIOLITE
30.0000 | Freq: Once | INTRAVENOUS | Status: AC | PRN
  Administered 2012-12-26: 30 via INTRAVENOUS

## 2012-12-26 NOTE — Progress Notes (Signed)
*  PRELIMINARY RESULTS* Echocardiogram 2D Echocardiogram has been performed.  Marilyn Martin 12/26/2012, 8:51 AM

## 2012-12-26 NOTE — Addendum Note (Signed)
Addended by: Derry Lory A on: 12/26/2012 08:16 AM   Modules accepted: Orders

## 2012-12-26 NOTE — Progress Notes (Signed)
Stress Lab Nurses Notes - [facility]  SHAUNEE MULKERN 12/26/2012  Reason for doing test: Dyspnea  Type of test: Stress Myoview  Nurse performing test: Marlena Clipper, RN  Nuclear Medicine Tech: Lou Cal  Echo Tech: Not Applicable  MD performing test: Margaretmary Lombard MD: Vyas  Test explained and consent signed: yes  IV started: 22g jelco, Saline lock flushed, No redness or edema and Saline lock started in radiology  Symptoms: Fatigue and SOB  Treatment/Intervention: None  Reason test stopped: reached target HR and SOB  After recovery IV was: Discontinued via X-ray tech, No redness or edema and Saline Lock flushed  Patient to return to Nuc. Med at :  Patient discharged: Home  Patient's Condition upon discharge was: stable  Comments: Patient walked 4 minutes and 28 seconds. Her resting HR was 81 and Resting BP was 115/73, Her peak HR was 166 and peak BP was 147/104. Symptoms resolved in recovery.   Angelica Pou

## 2013-01-30 ENCOUNTER — Ambulatory Visit: Payer: 59 | Admitting: Cardiovascular Disease

## 2013-03-06 ENCOUNTER — Ambulatory Visit: Payer: 59 | Admitting: Cardiovascular Disease

## 2013-04-09 ENCOUNTER — Other Ambulatory Visit: Payer: Self-pay

## 2013-07-09 ENCOUNTER — Other Ambulatory Visit (INDEPENDENT_AMBULATORY_CARE_PROVIDER_SITE_OTHER): Payer: Self-pay | Admitting: Internal Medicine

## 2013-07-09 ENCOUNTER — Telehealth (INDEPENDENT_AMBULATORY_CARE_PROVIDER_SITE_OTHER): Payer: Self-pay | Admitting: Internal Medicine

## 2013-07-09 DIAGNOSIS — J329 Chronic sinusitis, unspecified: Secondary | ICD-10-CM

## 2013-07-09 MED ORDER — LEVOFLOXACIN 500 MG PO TABS: 500.0000 mg | ORAL_TABLET | Freq: Every day | ORAL | Status: DC

## 2013-07-09 NOTE — Telephone Encounter (Signed)
Sent to [facility].  

## 2013-07-15 ENCOUNTER — Other Ambulatory Visit (INDEPENDENT_AMBULATORY_CARE_PROVIDER_SITE_OTHER): Payer: Self-pay | Admitting: Internal Medicine

## 2013-07-15 ENCOUNTER — Ambulatory Visit (HOSPITAL_COMMUNITY)
Admission: RE | Admit: 2013-07-15 | Discharge: 2013-07-15 | Disposition: A | Discharge status: Home / Self Care | Payer: 59 | Source: Ambulatory Visit | Attending: Internal Medicine | Admitting: Internal Medicine

## 2013-07-15 ENCOUNTER — Telehealth (INDEPENDENT_AMBULATORY_CARE_PROVIDER_SITE_OTHER): Payer: Self-pay | Admitting: Internal Medicine

## 2013-07-15 DIAGNOSIS — R1011 Right upper quadrant pain: Secondary | ICD-10-CM | POA: Insufficient documentation

## 2013-07-15 DIAGNOSIS — K8020 Calculus of gallbladder without cholecystitis without obstruction: Secondary | ICD-10-CM | POA: Insufficient documentation

## 2013-07-15 DIAGNOSIS — K769 Liver disease, unspecified: Secondary | ICD-10-CM | POA: Insufficient documentation

## 2013-07-15 LAB — HEPATIC FUNCTION PANEL
ALBUMIN: 3 g/dL — AB (ref 3.5–5.2)
ALK PHOS: 76 U/L (ref 39–117)
ALT: 23 U/L (ref 0–35)
AST: 15 U/L (ref 0–37)
Bilirubin, Direct: <0.1 mg/dL (ref 0.0–0.3)
TOTAL PROTEIN: 7.7 g/dL (ref 6.0–8.3)
Total Bilirubin: 0.3 mg/dL (ref 0.3–1.2)

## 2013-07-15 LAB — AMYLASE: Amylase: 30 U/L (ref 0–105)

## 2013-07-15 LAB — LIPASE: Lipase: 34 U/L (ref 11–59)

## 2013-07-15 NOTE — Telephone Encounter (Signed)
C/o rt upper quadrant pain. Nausea.

## 2013-07-28 ENCOUNTER — Encounter (HOSPITAL_COMMUNITY): Payer: Self-pay

## 2013-07-28 ENCOUNTER — Encounter (HOSPITAL_COMMUNITY): Payer: Self-pay | Admitting: Pharmacy Technician

## 2013-07-28 ENCOUNTER — Encounter (HOSPITAL_COMMUNITY)
Admission: RE | Admit: 2013-07-28 | Discharge: 2013-07-28 | Disposition: A | Discharge status: Home / Self Care | Payer: 59 | Source: Ambulatory Visit | Attending: General Surgery | Admitting: General Surgery

## 2013-07-28 LAB — CBC WITH DIFFERENTIAL/PLATELET
BASOS PCT: 1 % (ref 0–1)
Basophils Absolute: 0.1 10*3/uL (ref 0.0–0.1)
EOS ABS: 0.1 10*3/uL (ref 0.0–0.7)
Eosinophils Relative: 1 % (ref 0–5)
HCT: 45.4 % (ref 36.0–46.0)
Hemoglobin: 15.7 g/dL — ABNORMAL HIGH (ref 12.0–15.0)
Lymphocytes Relative: 28 % (ref 12–46)
Lymphs Abs: 2 10*3/uL (ref 0.7–4.0)
MCH: 31.1 pg (ref 26.0–34.0)
MCHC: 34.6 g/dL (ref 30.0–36.0)
MCV: 89.9 fL (ref 78.0–100.0)
MONOS PCT: 8 % (ref 3–12)
Monocytes Absolute: 0.6 10*3/uL (ref 0.1–1.0)
NEUTROS PCT: 62 % (ref 43–77)
Neutro Abs: 4.3 10*3/uL (ref 1.7–7.7)
PLATELETS: 248 10*3/uL (ref 150–400)
RBC: 5.05 MIL/uL (ref 3.87–5.11)
RDW: 13.3 % (ref 11.5–15.5)
WBC: 7 10*3/uL (ref 4.0–10.5)

## 2013-07-28 LAB — BASIC METABOLIC PANEL
BUN: 10 mg/dL (ref 6–23)
CALCIUM: 10.1 mg/dL (ref 8.4–10.5)
CO2: 29 mEq/L (ref 19–32)
CREATININE: 0.88 mg/dL (ref 0.50–1.10)
Chloride: 100 mEq/L (ref 96–112)
GFR calc Af Amer: 85 mL/min — ABNORMAL LOW (ref >90)
GFR, EST NON AFRICAN AMERICAN: 73 mL/min — AB (ref >90)
GLUCOSE: 119 mg/dL — AB (ref 70–99)
Potassium: 4 mEq/L (ref 3.7–5.3)
Sodium: 141 mEq/L (ref 137–147)

## 2013-07-28 NOTE — H&P (Signed)
  NTS SOAP Note  Vital Signs:  Vitals as of: 5/72/6203: Systolic 559: Diastolic 92: Heart Rate 111: Temp 96.33F: Height 14ft 7in: Weight 264Lbs 0 Ounces: BMI 41.35  BMI : 41.35 kg/m2  Subjective: This 53 Years 78 Months old Female presents for of gallstones.  Has been having intermittent right upper quadrant abdominal pain and nausea.  Intermittent over the past few months. No fever, chills, jaundice.  U/S of gallbladder reveals cholelithiasis, normal common bile duct, nodular liver.  Review of Symptoms:  Constitutional:unremarkable   Head:unremarkable    Eyes:unremarkable   Nose/Mouth/Throat:unremarkable Cardiovascular:  unremarkable   Respiratory:unremarkable   Gastrointestin    nausea,heartburn Genitourinary:unremarkable     Musculoskeletal:unremarkable   Skin:unremarkable Hematolgic/Lymphatic:unremarkable     Allergic/Immunologic:unremarkable     Past Medical History:    Reviewed  Past Medical History  Surgical History: BTL, c section, TCS, EGD Medical Problems: HTN, reflux Allergies: lasix, pantopraazole, loratadine, combipatch, triamterene/HCTZ   Social History:Reviewed  Social History  Preferred Language: English Race:  White Ethnicity: Not Hispanic / Latino Age: 53 Years 1 Months Marital Status:  M Alcohol: rarely Recreational drug(s): no   Smoking Status: Never smoker reviewed on 07/28/2013 Functional Status reviewed on 07/28/2013 ------------------------------------------------ Bathing: Normal Cooking: Normal Dressing: Normal Driving: Normal Eating: Normal Managing Meds: Normal Oral Care: Normal Shopping: Normal Toileting: Normal Transferring: Normal Walking: Normal Cognitive Status reviewed on 07/28/2013 ------------------------------------------------ Attention: Normal Decision Making: Normal Language: Normal Memory: Normal Motor: Normal Perception: Normal Problem Solving: Normal Visual and  Spatial: Normal   Family History:  Reviewed  Family Health History Mother, Deceased; Thyroid cancer; Alzheimer's disease;  Father, Deceased; Heart attack (myocardial infarction); Diabetes mellitus, type 2 (adult); Stroke (CVA);     Objective Information: General:  Well appearing, well nourished in no distress.   no scleral icterus Heart:  RRR, no murmur or gallop.  Normal S1, S2.  No S3, S4.  Lungs:    CTA bilaterally, no wheezes, rhonchi, rales.  Breathing unlabored. Abdomen:Soft, slightly tender in right upper quadrant to deep palpation, ND, normal bowel sounds, no HSM, no masses.  No peritoneal signs.  Assessment:Biliary colic, cholelithiasis, ?fatty liver vs cirrhosis  Diagnoses: 574.20 Gallstone (Calculus of gallbladder without cholecystitis without obstruction)  Procedures: 74163 - OFFICE OUTPATIENT NEW 30 MINUTES    Plan:Scheduled for laparoscopic cholecystectomy, liver biopsy on 07/31/13.   Patient Education:Alternative treatments to surgery were discussed with patient (and family).  Risks and benefits  of procedure including bleeding, infection, hepatobiliary injury, and the possibility of an open procedure were fully explained to the patient (and family) who gave informed consent. Patient/family questions were addressed.  Follow-up:Pending Surgery

## 2013-07-28 NOTE — Patient Instructions (Addendum)
Laparoscopic Cholecystectomy Laparoscopic cholecystectomy is surgery to remove the gallbladder. The gallbladder is located in the upper right part of the abdomen, behind the liver. It is a storage sac for bile produced in the liver. Bile aids in the digestion and absorption of fats. Cholecystectomy is often done for inflammation of the gallbladder (cholecystitis). This condition is usually caused by a buildup of gallstones (cholelithiasis) in your gallbladder. Gallstones can block the flow of bile, resulting in inflammation and pain. In severe cases, emergency surgery may be required. When emergency surgery is not required, you will have time to prepare for the procedure. Laparoscopic surgery is an alternative to open surgery. Laparoscopic surgery has a shorter recovery time. Your common bile duct may also need to be examined during the procedure. If stones are found in the common bile duct, they may be removed. LET [facility] CARE PROVIDER KNOW ABOUT:  Any allergies you have.  All medicines you are taking, including vitamins, herbs, eye drops, creams, and over-the-counter medicines.  Previous problems you or members of your family have had with the use of anesthetics.  Any blood disorders you have.  Previous surgeries you have had.  Medical conditions you have. RISKS AND COMPLICATIONS Generally, this is a safe procedure. However, as with any procedure, complications can occur. Possible complications include:  Infection.  Damage to the common bile duct, nerves, arteries, veins, or other internal organs such as the stomach, liver, or intestines.  Bleeding.  A stone may remain in the common bile duct.  A bile leak from the cyst duct that is clipped when your gallbladder is removed.  The need to convert to open surgery, which requires a larger incision in the abdomen. This may be necessary if your surgeon thinks it is not safe to continue with a laparoscopic procedure. BEFORE  THE PROCEDURE  Ask your health care provider about changing or stopping any regular medicines. You will need to stop taking aspirin or blood thinners at least 5 days prior to surgery.  Do not eat or drink anything after midnight the night before surgery.  Let your health care provider know if you develop a cold or other infectious problem before surgery. PROCEDURE   You will be given medicine to make you sleep through the procedure (general anesthetic). A breathing tube will be placed in your mouth.  When you are asleep, your surgeon will make several small cuts (incisions) in your abdomen.  A thin, lighted tube with a tiny camera on the end (laparoscope) is inserted through one of the small incisions. The camera on the laparoscope sends a picture to a TV screen in the operating room. This gives the surgeon a good view inside your abdomen.  A gas will be pumped into your abdomen. This expands your abdomen so that the surgeon has more room to perform the surgery.  Other tools needed for the procedure are inserted through the other incisions. The gallbladder is removed through one of the incisions.  After the removal of your gallbladder, the incisions will be closed with stitches, staples, or skin glue. AFTER THE PROCEDURE  You will be taken to a recovery area where your progress will be checked often.  You may be allowed to go home the same day if your pain is controlled and you can tolerate liquids. Document Released: 05/21/2005 Document Revised: 03/11/2013 Document Reviewed: 12/31/2012 [facility] Patient Information 2014 [place]. PATIENT INSTRUCTIONS POST-ANESTHESIA  IMMEDIATELY FOLLOWING SURGERY:  Do not drive or  operate machinery for the first twenty four hours after surgery.  Do not make any important decisions for twenty four hours after surgery or while taking narcotic pain medications or sedatives.  If you develop intractable nausea and vomiting or a severe headache please  notify your doctor immediately.  FOLLOW-UP:  Please make an appointment with your surgeon as instructed. You do not need to follow up with anesthesia unless specifically instructed to do so.  WOUND CARE INSTRUCTIONS (if applicable):  Keep a dry clean dressing on the anesthesia/puncture wound site if there is drainage.  Once the wound has quit draining you may leave it open to air.  Generally you should leave the bandage intact for twenty four hours unless there is drainage.  If the epidural site drains for more than 36-48 hours please call the anesthesia department.  QUESTIONS?:  Please feel free to call your physician or the hospital operator if you have any questions, and they will be happy to assist you.        Your procedure is scheduled on: 07/31/2013  Report to [facility] at  33  AM.  Call this number if you have problems the morning of surgery: [phone]   Remember:   Do not drink or eat food:After Midnight.  :  Take these medicines the morning of surgery with A SIP OF WATER: claritin, protonix, dyazide   Do not wear jewelry, make-up or nail polish.  Do not wear lotions, powders, or perfumes.   Do not shave 48 hours prior to surgery. Men may shave face and neck.  Do not bring valuables to the hospital.  Contacts, dentures or bridgework may not be worn into surgery.  Leave suitcase in the car. After surgery it may be brought to your room.  For patients admitted to the hospital, checkout time is 11:00 AM the day of discharge.   Patients discharged the day of surgery will not be allowed to drive home.    Special Instructions: Shower using CHG night before surgery and shower the day of surgery use CHG.  Use special wash - you have one bottle of CHG for all showers.  You should use approximately 1/2 of the bottle for each shower.   Please read over the following fact sheets that you were given: Pain Booklet, MRSA Information, Surgical Site Infection Prevention and Care and Recovery  After Surgery

## 2013-07-31 ENCOUNTER — Encounter (HOSPITAL_COMMUNITY): Payer: Self-pay | Admitting: *Deleted

## 2013-07-31 ENCOUNTER — Encounter (HOSPITAL_COMMUNITY)
Admission: RE | Disposition: A | Discharge status: Home / Self Care | Payer: Self-pay | Source: Ambulatory Visit | Attending: General Surgery

## 2013-07-31 ENCOUNTER — Ambulatory Visit (HOSPITAL_COMMUNITY)
Admission: RE | Admit: 2013-07-31 | Discharge: 2013-07-31 | Disposition: A | Discharge status: Home / Self Care | Payer: 59 | Source: Ambulatory Visit | Attending: General Surgery | Admitting: General Surgery

## 2013-07-31 ENCOUNTER — Encounter (HOSPITAL_COMMUNITY): Payer: 59 | Admitting: Anesthesiology

## 2013-07-31 ENCOUNTER — Ambulatory Visit (HOSPITAL_COMMUNITY): Payer: 59 | Admitting: Anesthesiology

## 2013-07-31 DIAGNOSIS — K8010 Calculus of gallbladder with chronic cholecystitis without obstruction: Secondary | ICD-10-CM | POA: Insufficient documentation

## 2013-07-31 DIAGNOSIS — Z6841 Body Mass Index (BMI) 40.0 and over, adult: Secondary | ICD-10-CM | POA: Insufficient documentation

## 2013-07-31 DIAGNOSIS — Z79899 Other long term (current) drug therapy: Secondary | ICD-10-CM | POA: Insufficient documentation

## 2013-07-31 DIAGNOSIS — K7689 Other specified diseases of liver: Secondary | ICD-10-CM | POA: Insufficient documentation

## 2013-07-31 DIAGNOSIS — Z87891 Personal history of nicotine dependence: Secondary | ICD-10-CM | POA: Insufficient documentation

## 2013-07-31 DIAGNOSIS — I10 Essential (primary) hypertension: Secondary | ICD-10-CM | POA: Insufficient documentation

## 2013-07-31 HISTORY — PX: LIVER BIOPSY: SHX301

## 2013-07-31 HISTORY — PX: CHOLECYSTECTOMY: SHX55

## 2013-07-31 SURGERY — LAPAROSCOPIC CHOLECYSTECTOMY
Anesthesia: General | Site: Abdomen

## 2013-07-31 MED ORDER — SODIUM CHLORIDE 0.9 % IR SOLN
Status: DC | PRN
  Administered 2013-07-31: 1000 mL

## 2013-07-31 MED ORDER — SUCCINYLCHOLINE CHLORIDE 20 MG/ML IJ SOLN
INTRAMUSCULAR | Status: DC | PRN
  Administered 2013-07-31: 120 mg via INTRAVENOUS

## 2013-07-31 MED ORDER — NEOSTIGMINE METHYLSULFATE 1 MG/ML IJ SOLN
INTRAMUSCULAR | Status: AC
  Filled 2013-07-31: qty 1

## 2013-07-31 MED ORDER — GLYCOPYRROLATE 0.2 MG/ML IJ SOLN
INTRAMUSCULAR | Status: AC
  Filled 2013-07-31: qty 1

## 2013-07-31 MED ORDER — POVIDONE-IODINE 10 % OINT PACKET
TOPICAL_OINTMENT | CUTANEOUS | Status: DC | PRN
  Administered 2013-07-31: 1 via TOPICAL

## 2013-07-31 MED ORDER — CEFAZOLIN SODIUM 1-5 GM-% IV SOLN: 1.0000 g | INTRAVENOUS | Status: DC

## 2013-07-31 MED ORDER — ENOXAPARIN SODIUM 40 MG/0.4ML ~~LOC~~ SOLN
40.0000 mg | Freq: Once | SUBCUTANEOUS | Status: AC
  Administered 2013-07-31: 40 mg via SUBCUTANEOUS

## 2013-07-31 MED ORDER — POVIDONE-IODINE 10 % EX OINT
TOPICAL_OINTMENT | CUTANEOUS | Status: AC
  Filled 2013-07-31: qty 1

## 2013-07-31 MED ORDER — DEXAMETHASONE SODIUM PHOSPHATE 4 MG/ML IJ SOLN
4.0000 mg | Freq: Once | INTRAMUSCULAR | Status: AC
  Administered 2013-07-31: 4 mg via INTRAVENOUS

## 2013-07-31 MED ORDER — FENTANYL CITRATE 0.05 MG/ML IJ SOLN
INTRAMUSCULAR | Status: AC
  Filled 2013-07-31: qty 5

## 2013-07-31 MED ORDER — ROCURONIUM BROMIDE 100 MG/10ML IV SOLN
INTRAVENOUS | Status: DC | PRN
  Administered 2013-07-31: 5 mg via INTRAVENOUS
  Administered 2013-07-31: 30 mg via INTRAVENOUS

## 2013-07-31 MED ORDER — HYDROCODONE-ACETAMINOPHEN 5-325 MG PO TABS: 1.0000 | ORAL_TABLET | ORAL | Status: DC | PRN

## 2013-07-31 MED ORDER — BUPIVACAINE HCL (PF) 0.5 % IJ SOLN
INTRAMUSCULAR | Status: AC
  Filled 2013-07-31: qty 30

## 2013-07-31 MED ORDER — MIDAZOLAM HCL 2 MG/2ML IJ SOLN
INTRAMUSCULAR | Status: AC
  Filled 2013-07-31: qty 2

## 2013-07-31 MED ORDER — ROCURONIUM BROMIDE 50 MG/5ML IV SOLN
INTRAVENOUS | Status: AC
  Filled 2013-07-31: qty 1

## 2013-07-31 MED ORDER — PROPOFOL 10 MG/ML IV EMUL
INTRAVENOUS | Status: AC
  Filled 2013-07-31: qty 20

## 2013-07-31 MED ORDER — LIDOCAINE HCL (PF) 1 % IJ SOLN
INTRAMUSCULAR | Status: AC
  Filled 2013-07-31: qty 5

## 2013-07-31 MED ORDER — LACTATED RINGERS IV SOLN
INTRAVENOUS | Status: DC
  Administered 2013-07-31: 07:00:00 via INTRAVENOUS

## 2013-07-31 MED ORDER — CEFAZOLIN SODIUM-DEXTROSE 2-3 GM-% IV SOLR: 2.0000 g | INTRAVENOUS | Status: DC

## 2013-07-31 MED ORDER — FENTANYL CITRATE 0.05 MG/ML IJ SOLN
25.0000 ug | INTRAMUSCULAR | Status: DC | PRN
  Administered 2013-07-31: 50 ug via INTRAVENOUS

## 2013-07-31 MED ORDER — CHLORHEXIDINE GLUCONATE 4 % EX LIQD: 1.0000 "application " | Freq: Once | CUTANEOUS | Status: DC

## 2013-07-31 MED ORDER — SUCCINYLCHOLINE CHLORIDE 20 MG/ML IJ SOLN
INTRAMUSCULAR | Status: AC
  Filled 2013-07-31: qty 1

## 2013-07-31 MED ORDER — ONDANSETRON HCL 4 MG/2ML IJ SOLN: 4.0000 mg | Freq: Once | INTRAMUSCULAR | Status: DC | PRN

## 2013-07-31 MED ORDER — NEOSTIGMINE METHYLSULFATE 1 MG/ML IJ SOLN
INTRAMUSCULAR | Status: DC | PRN
  Administered 2013-07-31: 3 mg via INTRAVENOUS

## 2013-07-31 MED ORDER — BUPIVACAINE HCL (PF) 0.5 % IJ SOLN
INTRAMUSCULAR | Status: DC | PRN
  Administered 2013-07-31: 10 mL

## 2013-07-31 MED ORDER — LIDOCAINE HCL 1 % IJ SOLN
INTRAMUSCULAR | Status: DC | PRN
  Administered 2013-07-31: 30 mg via INTRADERMAL

## 2013-07-31 MED ORDER — FENTANYL CITRATE 0.05 MG/ML IJ SOLN
INTRAMUSCULAR | Status: DC | PRN
  Administered 2013-07-31 (×8): 50 ug via INTRAVENOUS

## 2013-07-31 MED ORDER — ENOXAPARIN SODIUM 40 MG/0.4ML ~~LOC~~ SOLN
SUBCUTANEOUS | Status: AC
  Filled 2013-07-31: qty 0.4

## 2013-07-31 MED ORDER — ONDANSETRON HCL 4 MG/2ML IJ SOLN
INTRAMUSCULAR | Status: AC
Start: 2013-07-31 — End: 2013-07-31
  Filled 2013-07-31: qty 2

## 2013-07-31 MED ORDER — PROPOFOL 10 MG/ML IV BOLUS
INTRAVENOUS | Status: DC | PRN
  Administered 2013-07-31: 180 mg via INTRAVENOUS

## 2013-07-31 MED ORDER — GLYCOPYRROLATE 0.2 MG/ML IJ SOLN
INTRAMUSCULAR | Status: AC
  Filled 2013-07-31: qty 3

## 2013-07-31 MED ORDER — CEFAZOLIN SODIUM-DEXTROSE 2-3 GM-% IV SOLR
INTRAVENOUS | Status: AC
  Filled 2013-07-31: qty 50

## 2013-07-31 MED ORDER — DEXAMETHASONE SODIUM PHOSPHATE 4 MG/ML IJ SOLN
INTRAMUSCULAR | Status: AC
  Filled 2013-07-31: qty 1

## 2013-07-31 MED ORDER — DEXTROSE 5 % IV SOLN
3.0000 g | INTRAVENOUS | Status: DC
  Administered 2013-07-31: 3 g via INTRAVENOUS
  Filled 2013-07-31: qty 3000

## 2013-07-31 MED ORDER — ONDANSETRON HCL 4 MG/2ML IJ SOLN
4.0000 mg | Freq: Once | INTRAMUSCULAR | Status: AC
  Administered 2013-07-31: 4 mg via INTRAVENOUS

## 2013-07-31 MED ORDER — GLYCOPYRROLATE 0.2 MG/ML IJ SOLN
INTRAMUSCULAR | Status: DC | PRN
  Administered 2013-07-31: .5 mg via INTRAVENOUS

## 2013-07-31 MED ORDER — GLYCOPYRROLATE 0.2 MG/ML IJ SOLN
0.2000 mg | Freq: Once | INTRAMUSCULAR | Status: AC
  Administered 2013-07-31: 0.2 mg via INTRAVENOUS

## 2013-07-31 MED ORDER — MIDAZOLAM HCL 2 MG/2ML IJ SOLN
1.0000 mg | INTRAMUSCULAR | Status: DC | PRN
  Administered 2013-07-31: 2 mg via INTRAVENOUS

## 2013-07-31 MED ORDER — KETOROLAC TROMETHAMINE 30 MG/ML IJ SOLN
30.0000 mg | Freq: Once | INTRAMUSCULAR | Status: AC
  Administered 2013-07-31: 30 mg via INTRAVENOUS
  Filled 2013-07-31: qty 1

## 2013-07-31 MED ORDER — HEMOSTATIC AGENTS (NO CHARGE) OPTIME
TOPICAL | Status: DC | PRN
Start: 2013-07-31 — End: 2013-07-31
  Administered 2013-07-31: 1 via TOPICAL

## 2013-07-31 MED ORDER — CEFAZOLIN SODIUM 1-5 GM-% IV SOLN
INTRAVENOUS | Status: AC
  Filled 2013-07-31: qty 50

## 2013-07-31 SURGICAL SUPPLY — 58 items
APPLIER CLIP LAPSCP 10X32 DD (CLIP) ×3 IMPLANT
BAG HAMPER (MISCELLANEOUS) ×3 IMPLANT
BAG SPEC RTRVL LRG 6X4 10 (ENDOMECHANICALS) ×1
CLOTH BEACON ORANGE TIMEOUT ST (SAFETY) ×3 IMPLANT
COVER LIGHT HANDLE STERIS (MISCELLANEOUS) ×6 IMPLANT
DECANTER SPIKE VIAL GLASS SM (MISCELLANEOUS) ×3 IMPLANT
DURAPREP 26ML APPLICATOR (WOUND CARE) ×3 IMPLANT
ELECT REM PT RETURN 9FT ADLT (ELECTROSURGICAL) ×3
ELECTRODE REM PT RTRN 9FT ADLT (ELECTROSURGICAL) ×1 IMPLANT
FILTER SMOKE EVAC LAPAROSHD (FILTER) ×3 IMPLANT
FORMALIN 10 PREFIL 120ML (MISCELLANEOUS) ×5 IMPLANT
GLOVE BIO SURGEON STRL SZ7.5 (GLOVE) ×5 IMPLANT
GLOVE BIOGEL PI IND STRL 7.0 (GLOVE) IMPLANT
GLOVE BIOGEL PI IND STRL 8 (GLOVE) ×1 IMPLANT
GLOVE BIOGEL PI IND STRL 8.5 (GLOVE) IMPLANT
GLOVE BIOGEL PI INDICATOR 7.0 (GLOVE) ×4
GLOVE BIOGEL PI INDICATOR 8 (GLOVE) ×2
GLOVE BIOGEL PI INDICATOR 8.5 (GLOVE) ×2
GLOVE ECLIPSE 7.0 STRL STRAW (GLOVE) ×2 IMPLANT
GLOVE ECLIPSE 8.5 STRL (GLOVE) ×2 IMPLANT
GLOVE EXAM NITRILE LRG STRL (GLOVE) ×2 IMPLANT
GLOVE SS BIOGEL STRL SZ 6.5 (GLOVE) IMPLANT
GLOVE SUPERSENSE BIOGEL SZ 6.5 (GLOVE) ×2
GOWN STRL REUS W/TWL LRG LVL3 (GOWN DISPOSABLE) ×11 IMPLANT
HEMOSTAT SNOW SURGICEL 2X4 (HEMOSTASIS) ×3 IMPLANT
INST SET LAPROSCOPIC AP (KITS) ×3 IMPLANT
KIT ROOM TURNOVER APOR (KITS) ×3 IMPLANT
MANIFOLD NEPTUNE II (INSTRUMENTS) ×3 IMPLANT
NDL BIOPSY 14GX4.5 SOFT TIS (NEEDLE) IMPLANT
NDL BIOPSY 14X6 SOFT TISS (NEEDLE) IMPLANT
NDL HYPO 25X1 1.5 SAFETY (NEEDLE) ×1 IMPLANT
NDL INSUFFLATION 14GA 120MM (NEEDLE) ×1 IMPLANT
NEEDLE BIOPSY 14GX4.5 SOFT TIS (NEEDLE) ×3 IMPLANT
NEEDLE BIOPSY 14X6 SOFT TISS (NEEDLE) ×3 IMPLANT
NEEDLE HYPO 25X1 1.5 SAFETY (NEEDLE) ×3 IMPLANT
NEEDLE INSUFFLATION 14GA 120MM (NEEDLE) ×3 IMPLANT
NS IRRIG 1000ML POUR BTL (IV SOLUTION) ×3 IMPLANT
PACK LAP CHOLE LZT030E (CUSTOM PROCEDURE TRAY) ×3 IMPLANT
PACK MINOR (CUSTOM PROCEDURE TRAY) ×3 IMPLANT
PAD ARMBOARD 7.5X6 YLW CONV (MISCELLANEOUS) ×3 IMPLANT
PAD TELFA 3X4 1S STER (GAUZE/BANDAGES/DRESSINGS) ×2 IMPLANT
POUCH SPECIMEN RETRIEVAL 10MM (ENDOMECHANICALS) ×3 IMPLANT
SET BASIN LINEN APH (SET/KITS/TRAYS/PACK) ×3 IMPLANT
SLEEVE ENDOPATH XCEL 5M (ENDOMECHANICALS) ×3 IMPLANT
SPONGE GAUZE 2X2 8PLY STER LF (GAUZE/BANDAGES/DRESSINGS) ×4
SPONGE GAUZE 2X2 8PLY STRL LF (GAUZE/BANDAGES/DRESSINGS) ×8 IMPLANT
STAPLER VISISTAT (STAPLE) ×3 IMPLANT
SUT VIC AB 4-0 PS2 27 (SUTURE) ×3 IMPLANT
SUT VICRYL 0 UR6 27IN ABS (SUTURE) ×3 IMPLANT
SYR CONTROL 10ML LL (SYRINGE) ×3 IMPLANT
TAPE CLOTH SURG 4X10 WHT LF (GAUZE/BANDAGES/DRESSINGS) ×2 IMPLANT
TOWEL OR 17X26 4PK STRL BLUE (TOWEL DISPOSABLE) ×3 IMPLANT
TROCAR ENDO BLADELESS 11MM (ENDOMECHANICALS) ×3 IMPLANT
TROCAR XCEL NON-BLD 5MMX100MML (ENDOMECHANICALS) ×3 IMPLANT
TROCAR XCEL UNIV SLVE 11M 100M (ENDOMECHANICALS) ×3 IMPLANT
TUBING INSUFFLATION (TUBING) ×3 IMPLANT
WARMER LAPAROSCOPE (MISCELLANEOUS) ×3 IMPLANT
YANKAUER SUCT 12FT TUBE ARGYLE (SUCTIONS) ×3 IMPLANT

## 2013-07-31 NOTE — Anesthesia Procedure Notes (Signed)
Procedure Name: Intubation Date/Time: 07/31/2013 7:43 AM Performed by: Tressie Stalker E Pre-anesthesia Checklist: Patient identified, Patient being monitored, Timeout performed, Emergency Drugs available and Suction available Patient Re-evaluated:Patient Re-evaluated prior to inductionOxygen Delivery Method: Circle System Utilized Preoxygenation: Pre-oxygenation with 100% oxygen Intubation Type: IV induction, Rapid sequence and Cricoid Pressure applied Ventilation: Mask ventilation without difficulty Laryngoscope Size: Mac and 3 Grade View: Grade I Tube type: Oral Tube size: 7.0 mm Number of attempts: 1 Airway Equipment and Method: stylet Placement Confirmation: ETT inserted through vocal cords under direct vision,  positive ETCO2 and breath sounds checked- equal and bilateral Secured at: 21 cm Tube secured with: Tape Dental Injury: Teeth and Oropharynx as per pre-operative assessment

## 2013-07-31 NOTE — Anesthesia Preprocedure Evaluation (Signed)
Anesthesia Evaluation  Patient identified by MRN, date of birth, ID band Patient awake    Reviewed: Allergy & Precautions, H&P , NPO status , Patient's Chart, lab work & pertinent test results  Airway Mallampati: II TM Distance: >3 FB Neck ROM: Full    Dental  (+) Teeth Intact   Pulmonary shortness of breath, former smoker,  breath sounds clear to auscultation        Cardiovascular hypertension, Pt. on medications Rhythm:Regular Rate:Normal     Neuro/Psych    GI/Hepatic GERD-  Medicated and Controlled,  Endo/Other    Renal/GU Renal disease     Musculoskeletal   Abdominal   Peds  Hematology   Anesthesia Other Findings   Reproductive/Obstetrics                           Anesthesia Physical Anesthesia Plan  ASA: II  Anesthesia Plan: General   Post-op Pain Management:    Induction: Intravenous, Rapid sequence and Cricoid pressure planned  Airway Management Planned: Oral ETT  Additional Equipment:   Intra-op Plan:   Post-operative Plan: Extubation in OR  Informed Consent: I have reviewed the patients History and Physical, chart, labs and discussed the procedure including the risks, benefits and alternatives for the proposed anesthesia with the patient or authorized representative who has indicated his/her understanding and acceptance.     Plan Discussed with:   Anesthesia Plan Comments:         Anesthesia Quick Evaluation

## 2013-07-31 NOTE — Interval H&P Note (Signed)
History and Physical Interval Note:  07/31/2013 7:29 AM  Marilyn Martin  has presented today for surgery, with the diagnosis of cholelithiasis  The various methods of treatment have been discussed with the patient and family. After consideration of risks, benefits and other options for treatment, the patient has consented to  Procedure(s): LAPAROSCOPIC CHOLECYSTECTOMY (N/A) LIVER BIOPSY (N/A) as a surgical intervention .  The patient's history has been reviewed, patient examined, no change in status, stable for surgery.  I have reviewed the patient's chart and labs.  Questions were answered to the patient's satisfaction.     Aviva Signs A

## 2013-07-31 NOTE — Transfer of Care (Signed)
Immediate Anesthesia Transfer of Care Note  Patient: Marilyn Martin  Procedure(s) Performed: Procedure(s): LAPAROSCOPIC CHOLECYSTECTOMY (N/A) LIVER BIOPSY (N/A)  Patient Location: PACU  Anesthesia Type:General  Level of Consciousness: awake, alert  and oriented  Airway & Oxygen Therapy: Patient Spontanous Breathing and Patient connected to face mask oxygen  Post-op Assessment: Report given to PACU RN  Post vital signs: Reviewed and stable  Complications: No apparent anesthesia complications

## 2013-07-31 NOTE — Op Note (Signed)
Patient:  Marilyn Martin  DOB:  April 14, 1961  MRN:  [id number]   Preop Diagnosis:  Cholecystitis, cholelithiasis  Postop Diagnosis:  Same  Procedure:  Laparoscopic cholecystectomy, liver biopsy  Surgeon:  Aviva Signs, M.D.  Anes:  General endotracheal  Indications:  Patient is a 53 year old white female who was found on ultrasound the gallbladder had cholelithiasis with question fatty liver versus cirrhosis. She now presents for laparoscopic cholecystectomy and liver biopsy. The risks and benefits of the procedure including bleeding, infection, hepatobiliary injury, and the possibility of an open procedure were fully explained to the patient, who gave informed consent.  Procedure note:  The patient is placed the supine position. After induction of general endotracheal anesthesia, the abdomen was prepped and draped using usual sterile technique with DuraPrep. Surgical site confirmation was performed.  A supraumbilical incision was made down to the fascia. A Veress needle was introduced into the abdominal cavity and confirmation of placement was done using the saline drop test. The abdomen was then insufflated to 16 mm mercury pressure. An 11 mm trocar was introduced into the abdominal cavity under direct visualization without difficulty. The patient was placed in reverse Trendelenburg position and additional 11 mm trocar was placed the epigastric region and 5 mm trochars were placed the right upper quadrant and right flank regions. The liver was inspected and noted to be within normal limits. The was no evidence of macronodular cirrhosis. There were some liver appendages that may be given the appearance of nodular cirrhosis on ultrasound. A Tru-Cut needle biopsy of the right lobe liver was performed and sent to pathology further examination. The puncture site was cauterized using Bovie electrocautery. The gallbladder was then retracted in a dynamic fashion in order to expose the triangle of  Calot. The cystic duct was first identified. Its juncture to the infundibulum was fully identified. Endoclips placed proximally and distally on the cystic duct, and the cystic duct was divided. This was likewise done cystic artery. The gallbladder was then freed away from the gallbladder fossa using Bovie electrocautery. The gallbladder was delivered through the epigastric trocar site using an Endo Catch bag. The gallbladder fossa was inspected and no abnormal bleeding or bile leakage was noted. Surgicel is placed the gallbladder fossa. All fluid and air were then evacuated from the abdominal cavity prior to removal of the trochars.  All wounds were irrigated normal saline. All wounds were injected with 0.5% Sensorcaine. The supraumbilical fascia was reapproximated using an 0 Vicryl interrupted suture. All skin incisions were closed using staples. Betadine ointment and dry sterile dressings were applied.  All tape and needle counts were correct at the end of the procedure. Patient was extubated in the operating room and transferred to PACU in stable condition.  Complications:  None  EBL:  Minimal  Specimen:  Gallbladder, liver biopsy

## 2013-07-31 NOTE — Discharge Instructions (Signed)
Laparoscopic Cholecystectomy, Care After °Refer to this sheet in the next few weeks. These instructions provide you with information on caring for yourself after your procedure. Your health care provider may also give you more specific instructions. Your treatment has been planned according to current medical practices, but problems sometimes occur. Call your health care provider if you have any problems or questions after your procedure. °WHAT TO EXPECT AFTER THE PROCEDURE °After your procedure, it is typical to have the following: °· Pain at your incision sites. You will be given pain medicines to control the pain. °· Mild nausea or vomiting. This should improve after the first 24 hours. °· Bloating and possibly shoulder pain from the gas used during the procedure. This will improve after the first 24 hours. °HOME CARE INSTRUCTIONS  °· Change bandages (dressings) as directed by your health care provider. °· Keep the wound dry and clean. You may wash the wound gently with soap and water. Gently blot or dab the area dry. °· Do not take baths or use swimming pools or hot tubs for 2 weeks or until your health care provider approves. °· Only take over-the-counter or prescription medicines as directed by your health care provider. °· Continue your normal diet as directed by your health care provider. °· Do not lift anything heavier than 10 pounds (4.5 kg) until your health care provider approves. °· Do not play contact sports for 1 week or until your health care provider approves. °SEEK MEDICAL CARE IF:  °· You have redness, swelling, or increasing pain in the wound. °· You notice yellowish-white fluid (pus) coming from the wound. °· You have drainage from the wound that lasts longer than 1 day. °· You notice a bad smell coming from the wound or dressing. °· Your surgical cuts (incisions) break open. °SEEK IMMEDIATE MEDICAL CARE IF:  °· You develop a rash. °· You have difficulty breathing. °· You have chest pain. °· You  have a fever. °· You have increasing pain in the shoulders (shoulder strap areas). °· You have dizzy episodes or faint while standing. °· You have severe abdominal pain. °· You feel sick to your stomach (nauseous) or throw up (vomit) and this lasts for more than 1 day. °Document Released: 05/21/2005 Document Revised: 03/11/2013 Document Reviewed: 12/31/2012 °[employer]® Patient Information ©2014 [employer]. ° °

## 2013-07-31 NOTE — Anesthesia Postprocedure Evaluation (Signed)
  Anesthesia Post-op Note  Patient: Marilyn Martin  Procedure(s) Performed: Procedure(s): LAPAROSCOPIC CHOLECYSTECTOMY (N/A) LIVER BIOPSY (N/A)  Patient Location: PACU  Anesthesia Type:General  Level of Consciousness: awake, alert  and oriented  Airway and Oxygen Therapy: Patient Spontanous Breathing and Patient connected to face mask oxygen  Post-op Pain: mild  Post-op Assessment: Post-op Vital signs reviewed, Patient's Cardiovascular Status Stable, Respiratory Function Stable, Patent Airway and No signs of Nausea or vomiting  Post-op Vital Signs: Reviewed and stable  Complications: No apparent anesthesia complications

## 2013-08-03 ENCOUNTER — Encounter (HOSPITAL_COMMUNITY): Payer: Self-pay | Admitting: General Surgery

## 2013-09-03 ENCOUNTER — Other Ambulatory Visit (INDEPENDENT_AMBULATORY_CARE_PROVIDER_SITE_OTHER): Payer: Self-pay | Admitting: Internal Medicine

## 2013-09-03 DIAGNOSIS — R609 Edema, unspecified: Secondary | ICD-10-CM

## 2013-09-03 MED ORDER — FUROSEMIDE 20 MG PO TABS: 20.0000 mg | ORAL_TABLET | Freq: Every day | ORAL | Status: DC

## 2013-10-28 ENCOUNTER — Encounter: Payer: Self-pay | Admitting: Cardiovascular Disease

## 2013-10-28 ENCOUNTER — Ambulatory Visit (INDEPENDENT_AMBULATORY_CARE_PROVIDER_SITE_OTHER): Payer: 59 | Admitting: Cardiovascular Disease

## 2013-10-28 VITALS — BP 129/82 | HR 93 | Ht 66.0 in | Wt 270.0 lb

## 2013-10-28 DIAGNOSIS — R0602 Shortness of breath: Secondary | ICD-10-CM

## 2013-10-28 DIAGNOSIS — M7989 Other specified soft tissue disorders: Secondary | ICD-10-CM

## 2013-10-28 DIAGNOSIS — I10 Essential (primary) hypertension: Secondary | ICD-10-CM

## 2013-10-28 DIAGNOSIS — Z713 Dietary counseling and surveillance: Secondary | ICD-10-CM

## 2013-10-28 DIAGNOSIS — Z7182 Exercise counseling: Secondary | ICD-10-CM

## 2013-10-28 NOTE — Patient Instructions (Signed)
Continue all current medications. Follow up as needed  

## 2013-10-28 NOTE — Progress Notes (Signed)
Patient ID: Marilyn Martin, female   DOB: August 05, 1960, 53 y.o.   MRN: [id number]      SUBJECTIVE: The patient is a 53 year old woman who returns for a followup visit. I initially evaluated her for shortness of breath and leg edema in July 2014. An echocardiogram at that time demonstrated normal left ventricular systolic function with mild LVH and grade 1 diastolic dysfunction. A nuclear stress test was normal, with no evidence of myocardial ischemia or scar.  Since that time, she tells me that she has had a very difficult time self motivating to begin exercising and dieting. She pays for [employer] monthly but has not attended a meeting. 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 On her off days she takes care of her grandchildren.  Her father died of a heart attack and her brother has heart disease. Her older sister has early onset Alzheimer's disease and her mother died of Alzheimer's disease.  She has some mild dependent bilateral feet edema. She denies chest pain. She has shortness of breath deemed secondary to cardiovascular deconditioning and obesity.    Allergies  Allergen Reactions  . Bee Venom Swelling    Current Outpatient Prescriptions  Medication Sig Dispense Refill  . acetaminophen (TYLENOL) 500 MG tablet Take 1,000 mg by mouth every 6 (six) hours as needed. For pain/fever       . estradiol-norethindrone (COMBIPATCH) 0.05-0.14 MG/DAY Place 1 patch onto the skin 2 (two) times a week.      . furosemide (LASIX) 20 MG tablet Take 1 tablet (20 mg total) by mouth daily.  90 tablet  4  . loratadine (CLARITIN) 10 MG tablet Take 10 mg by mouth daily.      . pantoprazole (PROTONIX) 40 MG tablet Take 1 tablet (40 mg total) by mouth daily.  90 tablet  3  . triamterene-hydrochlorothiazide (DYAZIDE) 37.5-25 MG per capsule Take 1 capsule by mouth every morning.         No current facility-administered medications for this visit.    Past Medical  History  Diagnosis Date  . Nephrolithiasis   . Cholelithiasis   . Dyspnea on exertion     lower extremity edema  . Hypertension 2006    onset in 2006; controlled with single drug  . GERD (gastroesophageal reflux disease)     Past Surgical History  Procedure Laterality Date  . Cesarean section    . Tubal ligation    . Tonsillectomy    . Colonoscopy with esophagogastroduodenoscopy (egd)  04/25/2012    Procedure: COLONOSCOPY WITH ESOPHAGOGASTRODUODENOSCOPY (EGD);  Surgeon: Malissa Hippo, MD;  Location: [facility];  Service: Endoscopy;  Laterality: N/A;  730  . Maloney dilation  04/25/2012    Procedure: MALONEY DILATION;  Surgeon: Malissa Hippo, MD;  Location: [facility];  Service: Endoscopy;  Laterality: N/A;  . Dilation and curettage of uterus    . Cholecystectomy N/A 07/31/2013    Procedure: LAPAROSCOPIC CHOLECYSTECTOMY;  Surgeon: Dalia Heading, MD;  Location: [facility] ORS;  Service: General;  Laterality: N/A;  . Liver biopsy N/A 07/31/2013    Procedure: LIVER BIOPSY;  Surgeon: Dalia Heading, MD;  Location: [facility] OR[facility];  Service: General;  Laterality: N/A;    History   Social History  . Marital Status: Married    Spouse Name: N/A    Number of Children: 2  . Years of Education: N/A   Occupational History  . Medical office staff [employer]    Dr. Lionel December   Social History Main Topics  . Smoking status: Former Smoker -- 0.50 packs/day for 4 years    Types: Cigarettes    Quit date: 06/04/1981  . Smokeless tobacco: Never Used  . Alcohol Use: 0.0 oz/week     Comment: Rarely  . Drug Use: No  . Sexual Activity: Yes    Birth Control/ Protection: Surgical   Other Topics Concern  . Not on file   Social History Narrative  . No narrative on file     Filed Vitals:   10/28/13 1352  BP: 129/82  Pulse: 93  Height: 5\' 6"  (1.676 m)  Weight: 270 lb (122.471 kg)    PHYSICAL EXAM General: NAD Neck: No JVD, no thyromegaly. Lungs: Clear to auscultation bilaterally  with normal respiratory effort. CV: Nondisplaced PMI.  Regular rate and rhythm, normal S1/S2, no S3/S4, no murmur. Trace pretibial and periankle edema.  No carotid bruit.  Normal pedal pulses.  Abdomen: Soft, nontender, no hepatosplenomegaly, no distention.  Neurologic: Alert and oriented x 3.  Psych: Normal affect. Extremities: No clubbing or cyanosis.   ECG: reviewed and available in electronic records.    ASSESSMENT AND PLAN: 1. Dyspnea, b/l feet edema, and obesity: We had an extensive discussion with regards to lifestyle risk factor modification, including both dietary and exercise changes. She knows she needs to begin exercising but has had difficulty motivating herself. I have offered to help develop an initial exercise plan for her. She has normal left ventricular systolic function and a normal nuclear stress test. Given her family history, I warned her that if she does not make changes with respect to her lifestyle, she would be at risk for the future development of cardiac disease. 2. HTN: Controlled on current therapy.  Dispo: f/u as needed.  Prentice Docker, M.D., F.A.C.C.

## 2014-02-10 ENCOUNTER — Telehealth (INDEPENDENT_AMBULATORY_CARE_PROVIDER_SITE_OTHER): Payer: Self-pay | Admitting: Internal Medicine

## 2014-02-10 DIAGNOSIS — R609 Edema, unspecified: Secondary | ICD-10-CM

## 2014-02-10 MED ORDER — FUROSEMIDE 20 MG PO TABS: 20.0000 mg | ORAL_TABLET | Freq: Every day | ORAL | Status: DC

## 2014-02-10 NOTE — Telephone Encounter (Signed)
Rx sent 

## 2014-03-04 ENCOUNTER — Telehealth (INDEPENDENT_AMBULATORY_CARE_PROVIDER_SITE_OTHER): Payer: Self-pay | Admitting: Internal Medicine

## 2014-03-04 DIAGNOSIS — J0120 Acute ethmoidal sinusitis, unspecified: Secondary | ICD-10-CM

## 2014-03-04 MED ORDER — LEVOFLOXACIN 500 MG PO TABS: 500.0000 mg | ORAL_TABLET | Freq: Every day | ORAL | Status: DC

## 2014-03-04 NOTE — Telephone Encounter (Signed)
Rx for Levaquin sent to her pharmacy

## 2014-03-19 ENCOUNTER — Other Ambulatory Visit: Payer: Self-pay

## 2014-06-22 ENCOUNTER — Telehealth (INDEPENDENT_AMBULATORY_CARE_PROVIDER_SITE_OTHER): Payer: Self-pay | Admitting: Internal Medicine

## 2014-06-22 MED ORDER — CHLORHEXIDINE GLUCONATE 0.12 % MT SOLN: 15.0000 mL | Freq: Two times a day (BID) | OROMUCOSAL | Status: DC

## 2014-06-22 NOTE — Telephone Encounter (Signed)
Rx sent to her pharmacy 

## 2014-09-10 ENCOUNTER — Other Ambulatory Visit: Payer: Self-pay | Admitting: Physician Assistant

## 2014-11-05 ENCOUNTER — Other Ambulatory Visit: Payer: Self-pay | Admitting: Obstetrics and Gynecology

## 2014-11-08 LAB — CYTOLOGY - PAP

## 2015-01-12 ENCOUNTER — Other Ambulatory Visit (INDEPENDENT_AMBULATORY_CARE_PROVIDER_SITE_OTHER): Payer: Self-pay | Admitting: Internal Medicine

## 2015-01-12 MED ORDER — CIPROFLOXACIN HCL 0.3 % OP SOLN: 2.0000 [drp] | Freq: Every day | OPHTHALMIC | Status: DC

## 2015-01-12 MED ORDER — CIPROFLOXACIN HCL 0.3 % OP SOLN: 2.0000 [drp] | Freq: Four times a day (QID) | OPHTHALMIC | Status: DC

## 2015-01-12 NOTE — Telephone Encounter (Signed)
Rx for eye drops sent to her pharmacy

## 2015-03-02 ENCOUNTER — Telehealth (INDEPENDENT_AMBULATORY_CARE_PROVIDER_SITE_OTHER): Payer: Self-pay | Admitting: Internal Medicine

## 2015-03-02 DIAGNOSIS — M7918 Myalgia, other site: Secondary | ICD-10-CM

## 2015-03-02 DIAGNOSIS — M2550 Pain in unspecified joint: Secondary | ICD-10-CM

## 2015-03-02 DIAGNOSIS — R509 Fever, unspecified: Secondary | ICD-10-CM

## 2015-03-02 NOTE — Telephone Encounter (Signed)
Labs ordered.

## 2015-03-03 LAB — ANA: Anti Nuclear Antibody(ANA): NEGATIVE

## 2015-03-03 LAB — SEDIMENTATION RATE: SED RATE: 19 mm/h (ref 0–30)

## 2015-03-03 LAB — RHEUMATOID FACTOR: Rhuematoid fact SerPl-aCnc: <10 IU/mL (ref <=14)

## 2015-03-04 LAB — CYCLIC CITRUL PEPTIDE ANTIBODY, IGG

## 2015-04-21 ENCOUNTER — Telehealth (INDEPENDENT_AMBULATORY_CARE_PROVIDER_SITE_OTHER): Payer: Self-pay | Admitting: Internal Medicine

## 2015-04-21 DIAGNOSIS — J0120 Acute ethmoidal sinusitis, unspecified: Secondary | ICD-10-CM

## 2015-04-21 MED ORDER — LEVOFLOXACIN 500 MG PO TABS
500.0000 mg | ORAL_TABLET | Freq: Every day | ORAL | Status: DC
Start: 2015-04-21 — End: 2015-07-23

## 2015-04-21 NOTE — Telephone Encounter (Signed)
Rx for Levaquin sent to her pharmacy

## 2015-06-10 DIAGNOSIS — R202 Paresthesia of skin: Secondary | ICD-10-CM | POA: Diagnosis not present

## 2015-06-10 DIAGNOSIS — M545 Low back pain: Secondary | ICD-10-CM | POA: Diagnosis not present

## 2015-06-10 DIAGNOSIS — I10 Essential (primary) hypertension: Secondary | ICD-10-CM | POA: Diagnosis not present

## 2015-06-10 DIAGNOSIS — R200 Anesthesia of skin: Secondary | ICD-10-CM | POA: Diagnosis not present

## 2015-06-13 ENCOUNTER — Other Ambulatory Visit (INDEPENDENT_AMBULATORY_CARE_PROVIDER_SITE_OTHER): Payer: Self-pay | Admitting: Internal Medicine

## 2015-06-13 DIAGNOSIS — R609 Edema, unspecified: Secondary | ICD-10-CM

## 2015-06-13 MED ORDER — FUROSEMIDE 20 MG PO TABS: 20.0000 mg | ORAL_TABLET | Freq: Every day | ORAL | Status: DC

## 2015-06-13 MED FILL — FUROSEMIDE 20 MG TABLET: 20 | 90 days supply | Qty: 90 | Fill #0

## 2015-06-13 MED FILL — RALOXIFENE HCL 60 MG TABLET: 60 | 60 days supply | Qty: 60 | Fill #2

## 2015-06-13 NOTE — Telephone Encounter (Signed)
Rx sent 

## 2015-06-16 ENCOUNTER — Other Ambulatory Visit (HOSPITAL_COMMUNITY): Payer: Self-pay | Admitting: Nurse Practitioner

## 2015-06-16 ENCOUNTER — Ambulatory Visit (HOSPITAL_COMMUNITY)
Admission: RE | Admit: 2015-06-16 | Discharge: 2015-06-16 | Disposition: A | Discharge status: Home / Self Care | Payer: 59 | Source: Ambulatory Visit | Attending: Nurse Practitioner | Admitting: Nurse Practitioner

## 2015-06-16 DIAGNOSIS — M4806 Spinal stenosis, lumbar region: Secondary | ICD-10-CM | POA: Insufficient documentation

## 2015-06-16 DIAGNOSIS — M545 Low back pain: Secondary | ICD-10-CM | POA: Insufficient documentation

## 2015-06-17 DIAGNOSIS — H5213 Myopia, bilateral: Secondary | ICD-10-CM | POA: Diagnosis not present

## 2015-06-22 ENCOUNTER — Other Ambulatory Visit (HOSPITAL_COMMUNITY)
Admission: RE | Admit: 2015-06-22 | Discharge: 2015-06-22 | Disposition: A | Discharge status: Home / Self Care | Payer: 59 | Source: Ambulatory Visit | Attending: Internal Medicine | Admitting: Internal Medicine

## 2015-06-22 ENCOUNTER — Ambulatory Visit (HOSPITAL_COMMUNITY)
Admission: RE | Admit: 2015-06-22 | Discharge: 2015-06-22 | Disposition: A | Discharge status: Home / Self Care | Payer: 59 | Source: Ambulatory Visit | Attending: Nurse Practitioner | Admitting: Nurse Practitioner

## 2015-06-22 ENCOUNTER — Other Ambulatory Visit (HOSPITAL_COMMUNITY): Payer: Self-pay | Admitting: Nurse Practitioner

## 2015-06-22 DIAGNOSIS — R935 Abnormal findings on diagnostic imaging of other abdominal regions, including retroperitoneum: Secondary | ICD-10-CM | POA: Diagnosis not present

## 2015-06-22 DIAGNOSIS — M545 Low back pain: Secondary | ICD-10-CM | POA: Insufficient documentation

## 2015-06-22 DIAGNOSIS — IMO0001 Reserved for inherently not codable concepts without codable children: Secondary | ICD-10-CM

## 2015-06-22 DIAGNOSIS — K429 Umbilical hernia without obstruction or gangrene: Secondary | ICD-10-CM | POA: Insufficient documentation

## 2015-06-22 DIAGNOSIS — N200 Calculus of kidney: Secondary | ICD-10-CM | POA: Insufficient documentation

## 2015-06-22 DIAGNOSIS — R609 Edema, unspecified: Secondary | ICD-10-CM | POA: Insufficient documentation

## 2015-06-22 DIAGNOSIS — N132 Hydronephrosis with renal and ureteral calculous obstruction: Secondary | ICD-10-CM | POA: Diagnosis not present

## 2015-06-22 DIAGNOSIS — N201 Calculus of ureter: Secondary | ICD-10-CM | POA: Diagnosis not present

## 2015-06-22 LAB — COMPREHENSIVE METABOLIC PANEL
ALBUMIN: 4.5 g/dL (ref 3.5–5.0)
ALT: 22 U/L (ref 14–54)
ANION GAP: 11 (ref 5–15)
AST: 20 U/L (ref 15–41)
Alkaline Phosphatase: 56 U/L (ref 38–126)
BILIRUBIN TOTAL: 0.4 mg/dL (ref 0.3–1.2)
BUN: 18 mg/dL (ref 6–20)
CALCIUM: 9.4 mg/dL (ref 8.9–10.3)
CO2: 28 mmol/L (ref 22–32)
CREATININE: 1.17 mg/dL — AB (ref 0.44–1.00)
Chloride: 101 mmol/L (ref 101–111)
GFR calc Af Amer: 59 mL/min — ABNORMAL LOW (ref >60)
GFR calc non Af Amer: 51 mL/min — ABNORMAL LOW (ref >60)
GLUCOSE: 100 mg/dL — AB (ref 65–99)
Potassium: 3.3 mmol/L — ABNORMAL LOW (ref 3.5–5.1)
SODIUM: 140 mmol/L (ref 135–145)
Total Protein: 7.7 g/dL (ref 6.5–8.1)

## 2015-06-22 LAB — CBC
HEMATOCRIT: 44.8 % (ref 36.0–46.0)
HEMOGLOBIN: 15 g/dL (ref 12.0–15.0)
MCH: 31.1 pg (ref 26.0–34.0)
MCHC: 33.5 g/dL (ref 30.0–36.0)
MCV: 92.8 fL (ref 78.0–100.0)
Platelets: 275 10*3/uL (ref 150–400)
RBC: 4.83 MIL/uL (ref 3.87–5.11)
RDW: 13.3 % (ref 11.5–15.5)
WBC: 8.7 10*3/uL (ref 4.0–10.5)

## 2015-06-23 ENCOUNTER — Encounter (HOSPITAL_COMMUNITY)
Admission: AD | Disposition: A | Discharge status: Home / Self Care | Payer: Self-pay | Source: Ambulatory Visit | Attending: Urology

## 2015-06-23 ENCOUNTER — Ambulatory Visit (HOSPITAL_COMMUNITY)
Admission: AD | Admit: 2015-06-23 | Discharge: 2015-06-23 | Disposition: A | Discharge status: Home / Self Care | Payer: 59 | Source: Ambulatory Visit | Attending: Urology | Admitting: Urology

## 2015-06-23 ENCOUNTER — Encounter (HOSPITAL_COMMUNITY): Payer: Self-pay | Admitting: General Practice

## 2015-06-23 ENCOUNTER — Other Ambulatory Visit: Payer: Self-pay | Admitting: Urology

## 2015-06-23 DIAGNOSIS — Z79810 Long term (current) use of selective estrogen receptor modulators (SERMs): Secondary | ICD-10-CM | POA: Insufficient documentation

## 2015-06-23 DIAGNOSIS — Z841 Family history of disorders of kidney and ureter: Secondary | ICD-10-CM | POA: Insufficient documentation

## 2015-06-23 DIAGNOSIS — E669 Obesity, unspecified: Secondary | ICD-10-CM | POA: Diagnosis not present

## 2015-06-23 DIAGNOSIS — Z0000 Encounter for general adult medical examination without abnormal findings: Secondary | ICD-10-CM | POA: Diagnosis not present

## 2015-06-23 DIAGNOSIS — N132 Hydronephrosis with renal and ureteral calculous obstruction: Secondary | ICD-10-CM | POA: Insufficient documentation

## 2015-06-23 DIAGNOSIS — I10 Essential (primary) hypertension: Secondary | ICD-10-CM | POA: Insufficient documentation

## 2015-06-23 DIAGNOSIS — N1330 Unspecified hydronephrosis: Secondary | ICD-10-CM | POA: Diagnosis not present

## 2015-06-23 DIAGNOSIS — Z87442 Personal history of urinary calculi: Secondary | ICD-10-CM | POA: Diagnosis not present

## 2015-06-23 DIAGNOSIS — Z6841 Body Mass Index (BMI) 40.0 and over, adult: Secondary | ICD-10-CM | POA: Insufficient documentation

## 2015-06-23 DIAGNOSIS — N201 Calculus of ureter: Secondary | ICD-10-CM | POA: Diagnosis not present

## 2015-06-23 DIAGNOSIS — M1990 Unspecified osteoarthritis, unspecified site: Secondary | ICD-10-CM | POA: Insufficient documentation

## 2015-06-23 DIAGNOSIS — Z87891 Personal history of nicotine dependence: Secondary | ICD-10-CM | POA: Insufficient documentation

## 2015-06-23 DIAGNOSIS — N200 Calculus of kidney: Secondary | ICD-10-CM | POA: Diagnosis not present

## 2015-06-23 DIAGNOSIS — Z79899 Other long term (current) drug therapy: Secondary | ICD-10-CM | POA: Diagnosis not present

## 2015-06-23 SURGERY — LITHOTRIPSY, ESWL
Anesthesia: LOCAL | Laterality: Left

## 2015-06-23 MED ORDER — DIPHENHYDRAMINE HCL 25 MG PO CAPS
25.0000 mg | ORAL_CAPSULE | ORAL | Status: AC
  Administered 2015-06-23: 25 mg via ORAL
  Filled 2015-06-23: qty 1

## 2015-06-23 MED ORDER — SODIUM CHLORIDE 0.9 % IV SOLN
INTRAVENOUS | Status: DC
  Administered 2015-06-23: 16:00:00 via INTRAVENOUS

## 2015-06-23 MED ORDER — CIPROFLOXACIN HCL 500 MG PO TABS
500.0000 mg | ORAL_TABLET | ORAL | Status: AC
  Administered 2015-06-23: 500 mg via ORAL
  Filled 2015-06-23: qty 1

## 2015-06-23 MED ORDER — DIAZEPAM 5 MG PO TABS
10.0000 mg | ORAL_TABLET | ORAL | Status: AC
  Administered 2015-06-23: 10 mg via ORAL
  Filled 2015-06-23: qty 2

## 2015-06-23 MED FILL — PHENAZOPYRIDINE 100 MG TAB: 100 | 7 days supply | Qty: 20 | Fill #0

## 2015-06-23 MED FILL — TAMSULOSIN HCL 0.4 MG CAP: 0.4 | 30 days supply | Qty: 30 | Fill #0

## 2015-06-30 DIAGNOSIS — Z87891 Personal history of nicotine dependence: Secondary | ICD-10-CM | POA: Diagnosis not present

## 2015-06-30 DIAGNOSIS — R21 Rash and other nonspecific skin eruption: Secondary | ICD-10-CM | POA: Diagnosis not present

## 2015-06-30 DIAGNOSIS — I10 Essential (primary) hypertension: Secondary | ICD-10-CM | POA: Diagnosis not present

## 2015-07-11 DIAGNOSIS — Z713 Dietary counseling and surveillance: Secondary | ICD-10-CM | POA: Diagnosis not present

## 2015-07-11 DIAGNOSIS — I10 Essential (primary) hypertension: Secondary | ICD-10-CM | POA: Diagnosis not present

## 2015-07-11 DIAGNOSIS — Z6841 Body Mass Index (BMI) 40.0 and over, adult: Secondary | ICD-10-CM | POA: Diagnosis not present

## 2015-07-11 DIAGNOSIS — R21 Rash and other nonspecific skin eruption: Secondary | ICD-10-CM | POA: Diagnosis not present

## 2015-07-11 MED FILL — hydrOXYzine HCL 25 MG TABS: 25 | 13 days supply | Qty: 40 | Fill #0

## 2015-07-11 MED FILL — predniSONE 5 MG (21) TBPK: 5 | 6 days supply | Qty: 21 | Fill #0

## 2015-07-12 ENCOUNTER — Other Ambulatory Visit: Payer: Self-pay | Admitting: Urology

## 2015-07-12 ENCOUNTER — Ambulatory Visit (HOSPITAL_COMMUNITY)
Admission: RE | Admit: 2015-07-12 | Discharge: 2015-07-12 | Disposition: A | Discharge status: Home / Self Care | Payer: 59 | Source: Ambulatory Visit | Attending: Urology | Admitting: Urology

## 2015-07-12 DIAGNOSIS — N201 Calculus of ureter: Secondary | ICD-10-CM | POA: Diagnosis not present

## 2015-07-12 DIAGNOSIS — Z9889 Other specified postprocedural states: Secondary | ICD-10-CM | POA: Insufficient documentation

## 2015-07-12 DIAGNOSIS — N200 Calculus of kidney: Secondary | ICD-10-CM | POA: Diagnosis not present

## 2015-07-13 ENCOUNTER — Other Ambulatory Visit: Payer: Self-pay

## 2015-07-13 ENCOUNTER — Ambulatory Visit (INDEPENDENT_AMBULATORY_CARE_PROVIDER_SITE_OTHER): Payer: Self-pay | Admitting: Urology

## 2015-07-13 DIAGNOSIS — N201 Calculus of ureter: Secondary | ICD-10-CM

## 2015-07-13 DIAGNOSIS — N1330 Unspecified hydronephrosis: Secondary | ICD-10-CM

## 2015-07-15 ENCOUNTER — Encounter (HOSPITAL_COMMUNITY): Payer: Self-pay

## 2015-07-15 ENCOUNTER — Encounter (HOSPITAL_COMMUNITY)
Admission: RE | Admit: 2015-07-15 | Discharge: 2015-07-15 | Disposition: A | Discharge status: Home / Self Care | Payer: 59 | Source: Ambulatory Visit | Attending: Urology | Admitting: Urology

## 2015-07-15 DIAGNOSIS — Z01812 Encounter for preprocedural laboratory examination: Secondary | ICD-10-CM | POA: Insufficient documentation

## 2015-07-15 DIAGNOSIS — Z01818 Encounter for other preprocedural examination: Secondary | ICD-10-CM | POA: Insufficient documentation

## 2015-07-15 DIAGNOSIS — I498 Other specified cardiac arrhythmias: Secondary | ICD-10-CM | POA: Insufficient documentation

## 2015-07-15 DIAGNOSIS — N201 Calculus of ureter: Secondary | ICD-10-CM | POA: Diagnosis not present

## 2015-07-15 HISTORY — DX: Unspecified osteoarthritis, unspecified site: M19.90

## 2015-07-15 HISTORY — DX: Headache: R51

## 2015-07-15 HISTORY — DX: Headache, unspecified: R51.9

## 2015-07-15 NOTE — Patient Instructions (Signed)
Marilyn Martin  07/15/2015   Your procedure is scheduled on: July 25, 2015  Report to [name]  Entrance take [facility]  elevators to 3rd floor to  [name] at 1200 AM.  Call this number if you have problems the morning of surgery [phone]   Remember: ONLY 1 PERSON MAY GO WITH YOU TO SHORT STAY TO GET  READY MORNING OF [name].  Do not eat food after midnight.  May have liquids until 7:00 am Monday morning day of surgery.  The nothing by mouth.    Take these medicines the morning of surgery with A SIP OF WATER:  DO NOT TAKE ANY DIABETIC MEDICATIONS DAY OF YOUR SURGERY                               You may not have any metal on your body including hair pins and              piercings  Do not wear jewelry, make-up, lotions, powders or perfumes, deodorant             Do not wear nail polish.  Do not shave  48 hours prior to surgery.           .   Do not bring valuables to the hospital. [name].  Contacts, dentures or bridgework may not be worn into surgery.      Patients discharged the day of surgery will not be allowed to drive home.  Name and phone number of your driver:  Special Instructions: coughing and deep breathing exercises, leg exercises              Please read over the following fact sheets you were given: _____________________________________________________________________             [name] - Preparing for Surgery Before surgery, you can play an important role.  Because skin is not sterile, your skin needs to be as free of germs as possible.  You can reduce the number of germs on your skin by washing with CHG (chlorahexidine gluconate) soap before surgery.  CHG is an antiseptic cleaner which kills germs and bonds with the skin to continue killing germs even after washing. Please DO NOT use if you have an allergy to CHG or antibacterial soaps.  If your skin  becomes reddened/irritated stop using the CHG and inform your nurse when you arrive at Short Stay. Do not shave (including legs and underarms) for at least 48 hours prior to the first CHG shower.  You may shave your face/neck. Please follow these instructions carefully:  1.  Shower with CHG Soap the night before surgery and the  morning of Surgery.  2.  If you choose to wash your hair, wash your hair first as usual with your  normal  shampoo.  3.  After you shampoo, rinse your hair and body thoroughly to remove the  shampoo.                           4.  Use CHG as you would any other liquid soap.  You can apply chg directly  to the skin and wash  Gently with a scrungie or clean washcloth.  5.  Apply the CHG Soap to your body ONLY FROM THE NECK DOWN.   Do not use on face/ open                           Wound or open sores. Avoid contact with eyes, ears mouth and genitals (private parts).                       Wash face,  Genitals (private parts) with your normal soap.             6.  Wash thoroughly, paying special attention to the area where your surgery  will be performed.  7.  Thoroughly rinse your body with warm water from the neck down.  8.  DO NOT shower/wash with your normal soap after using and rinsing off  the CHG Soap.                9.  Pat yourself dry with a clean towel.            10.  Wear clean pajamas.            11.  Place clean sheets on your bed the night of your first shower and do not  sleep with pets. Day of Surgery : Do not apply any lotions/deodorants the morning of surgery.  Please wear clean clothes to the hospital/surgery center.  FAILURE TO FOLLOW THESE INSTRUCTIONS MAY RESULT IN THE CANCELLATION OF YOUR SURGERY PATIENT SIGNATURE_________________________________  NURSE SIGNATURE__________________________________  ________________________________________________________________________

## 2015-07-15 NOTE — Progress Notes (Signed)
06-22-15 - CBC- EPIC 06-22-15 - CMP -EPIC 06-22-15 - CT Renal Stone Study - EPIC 07-12-15 - ABD 1 View - EPIC  12-25-12 - Stress Test - EPIC

## 2015-07-20 ENCOUNTER — Telehealth: Payer: Self-pay | Admitting: Urology

## 2015-07-20 NOTE — Telephone Encounter (Signed)
Pt advised of pre op date/time and sx date. Sx: 07/25/15 @ 2:00 @ [facility] with Dr Alyson Ingles. Pre op: Elvina Sidle will call patient with Pre op date and time.    Authorization has been approved for CPT: 52353--REF# [id number] per Pearletha Alfred with [facility].  No authorization is required for CPT: 52002.74420.52310.

## 2015-07-23 ENCOUNTER — Encounter (HOSPITAL_COMMUNITY): Payer: Self-pay | Admitting: Emergency Medicine

## 2015-07-23 ENCOUNTER — Emergency Department (HOSPITAL_COMMUNITY): Payer: 59

## 2015-07-23 ENCOUNTER — Emergency Department (HOSPITAL_COMMUNITY)
Admission: EM | Admit: 2015-07-23 | Discharge: 2015-07-23 | Disposition: A | Discharge status: Home / Self Care | Payer: 59 | Attending: Emergency Medicine | Admitting: Emergency Medicine

## 2015-07-23 DIAGNOSIS — N201 Calculus of ureter: Secondary | ICD-10-CM | POA: Diagnosis not present

## 2015-07-23 DIAGNOSIS — N12 Tubulo-interstitial nephritis, not specified as acute or chronic: Secondary | ICD-10-CM | POA: Diagnosis not present

## 2015-07-23 DIAGNOSIS — K219 Gastro-esophageal reflux disease without esophagitis: Secondary | ICD-10-CM | POA: Diagnosis not present

## 2015-07-23 DIAGNOSIS — N200 Calculus of kidney: Secondary | ICD-10-CM | POA: Diagnosis not present

## 2015-07-23 DIAGNOSIS — Z87891 Personal history of nicotine dependence: Secondary | ICD-10-CM | POA: Diagnosis not present

## 2015-07-23 DIAGNOSIS — M1990 Unspecified osteoarthritis, unspecified site: Secondary | ICD-10-CM | POA: Insufficient documentation

## 2015-07-23 DIAGNOSIS — I10 Essential (primary) hypertension: Secondary | ICD-10-CM | POA: Diagnosis not present

## 2015-07-23 DIAGNOSIS — R109 Unspecified abdominal pain: Secondary | ICD-10-CM | POA: Diagnosis present

## 2015-07-23 DIAGNOSIS — Z79899 Other long term (current) drug therapy: Secondary | ICD-10-CM | POA: Diagnosis not present

## 2015-07-23 LAB — BASIC METABOLIC PANEL
ANION GAP: 9 (ref 5–15)
BUN: 22 mg/dL — ABNORMAL HIGH (ref 6–20)
CALCIUM: 8.8 mg/dL — AB (ref 8.9–10.3)
CHLORIDE: 104 mmol/L (ref 101–111)
CO2: 25 mmol/L (ref 22–32)
Creatinine, Ser: 1.39 mg/dL — ABNORMAL HIGH (ref 0.44–1.00)
GFR calc non Af Amer: 41 mL/min — ABNORMAL LOW (ref >60)
GFR, EST AFRICAN AMERICAN: 48 mL/min — AB (ref >60)
Glucose, Bld: 93 mg/dL (ref 65–99)
Potassium: 3.9 mmol/L (ref 3.5–5.1)
SODIUM: 138 mmol/L (ref 135–145)

## 2015-07-23 LAB — CBC WITH DIFFERENTIAL/PLATELET
BASOS ABS: 0 10*3/uL (ref 0.0–0.1)
BASOS PCT: 0 %
Eosinophils Absolute: 0.1 10*3/uL (ref 0.0–0.7)
Eosinophils Relative: 1 %
HEMATOCRIT: 41.2 % (ref 36.0–46.0)
HEMOGLOBIN: 13.8 g/dL (ref 12.0–15.0)
Lymphocytes Relative: 17 %
Lymphs Abs: 1.8 10*3/uL (ref 0.7–4.0)
MCH: 31.1 pg (ref 26.0–34.0)
MCHC: 33.5 g/dL (ref 30.0–36.0)
MCV: 92.8 fL (ref 78.0–100.0)
Monocytes Absolute: 0.7 10*3/uL (ref 0.1–1.0)
Monocytes Relative: 7 %
NEUTROS ABS: 7.6 10*3/uL (ref 1.7–7.7)
NEUTROS PCT: 75 %
Platelets: 219 10*3/uL (ref 150–400)
RBC: 4.44 MIL/uL (ref 3.87–5.11)
RDW: 13.2 % (ref 11.5–15.5)
WBC: 10.2 10*3/uL (ref 4.0–10.5)

## 2015-07-23 LAB — URINALYSIS, ROUTINE W REFLEX MICROSCOPIC
Bilirubin Urine: NEGATIVE
Glucose, UA: NEGATIVE mg/dL
Hgb urine dipstick: NEGATIVE
KETONES UR: NEGATIVE mg/dL
LEUKOCYTES UA: NEGATIVE
NITRITE: POSITIVE — AB
PROTEIN: NEGATIVE mg/dL
Specific Gravity, Urine: 1.026 (ref 1.005–1.030)
pH: 6 (ref 5.0–8.0)

## 2015-07-23 LAB — URINE MICROSCOPIC-ADD ON: RBC / HPF: NONE SEEN RBC/hpf (ref 0–5)

## 2015-07-23 MED ORDER — SODIUM CHLORIDE 0.9 % IV BOLUS (SEPSIS)
1000.0000 mL | Freq: Once | INTRAVENOUS | Status: AC
  Administered 2015-07-23: 1000 mL via INTRAVENOUS

## 2015-07-23 MED ORDER — CEPHALEXIN 500 MG PO CAPS
500.0000 mg | ORAL_CAPSULE | Freq: Once | ORAL | Status: AC
  Administered 2015-07-23: 500 mg via ORAL
  Filled 2015-07-23: qty 1

## 2015-07-23 MED ORDER — CEPHALEXIN 500 MG PO CAPS: 500.0000 mg | ORAL_CAPSULE | Freq: Two times a day (BID) | ORAL | Status: DC

## 2015-07-23 MED ORDER — KETOROLAC TROMETHAMINE 30 MG/ML IJ SOLN
15.0000 mg | Freq: Once | INTRAMUSCULAR | Status: AC
  Administered 2015-07-23: 15 mg via INTRAVENOUS
  Filled 2015-07-23: qty 1

## 2015-07-23 MED ORDER — ONDANSETRON HCL 4 MG/2ML IJ SOLN
4.0000 mg | Freq: Once | INTRAMUSCULAR | Status: AC
  Administered 2015-07-23: 4 mg via INTRAVENOUS
  Filled 2015-07-23: qty 2

## 2015-07-23 NOTE — Consult Note (Signed)
Urology Consult  Referring physician:   Forde Dandy, MD Reason for referral:  Left Renal Colic Chief Complaint:  Left flank pain  History of Present Illness:   55 yo obese w female, 1 month post lithotripsy per Dr. Noah Delaine for large L UPJ stone, has passed some stone fragments, but now has had severe episode of L flank pain tonight, associated with nausea and vomiting, and s-p pressure.  She als has had some diarrhea. She has taken minimal Percocet.   Past Medical History  Diagnosis Date  . Nephrolithiasis   . Cholelithiasis   . Dyspnea on exertion     lower extremity edema  . Hypertension 2006    onset in 2006; controlled with single drug  . GERD (gastroesophageal reflux disease)   . Headache   . Arthritis    Past Surgical History  Procedure Laterality Date  . Cesarean section    . Tubal ligation    . Tonsillectomy    . Colonoscopy with esophagogastroduodenoscopy (egd)  04/25/2012    Procedure: COLONOSCOPY WITH ESOPHAGOGASTRODUODENOSCOPY (EGD);  Surgeon: Rogene Houston, MD;  [facility];  Service: Endoscopy;  Laterality: N/A;  730  . Maloney dilation  04/25/2012    Procedure: MALONEY DILATION;  Surgeon: Rogene Houston, MD;  Location: [facility] ENDO [facility];  Service: Endoscopy;  Laterality: N/A;  . Dilation and curettage of uterus    . Cholecystectomy N/A 07/31/2013    Procedure: LAPAROSCOPIC CHOLECYSTECTOMY;  Surgeon: Jamesetta So, MD;  Location: [facility] ORS;  Service: General;  Laterality: N/A;  . Liver biopsy N/A 07/31/2013    Procedure: LIVER BIOPSY;  Surgeon: Jamesetta So, MD;  Location: [facility] ORS;  Service: General;  Laterality: N/A;    Medications: I have reviewed the patient's current medications. Allergies:  Allergies  Allergen Reactions  . Bee Venom Swelling    Family History  Problem Relation Age of Onset  . Stroke Father   . Heart attack Father   . Alzheimer's disease Mother   . Diabetes Brother   . Alzheimer's disease Sister   . Colon cancer Paternal  Uncle    Social History:  reports that she quit smoking about 34 years ago. Her smoking use included Cigarettes. She has a 2 pack-year smoking history. She has never used smokeless tobacco. She reports that she drinks alcohol. She reports that she does not use illicit drugs.  ROS: All systems are reviewed and negative except as noted.  Physical Exam:  Vital signs in last 24 hours: Temp:  [98.1 F (36.7 C)] 98.1 F (36.7 C) (02/18 1922) Pulse Rate:  [74-100] 74 (02/18 2154) Resp:  [18-19] 19 (02/18 2154) BP: (119-128)/(71-81) 119/71 mmHg (02/18 2154) SpO2:  [93 %-97 %] 97 % (02/18 2154) Weight:  [122.471 kg (270 lb)] 122.471 kg (270 lb) (02/18 1922)  Cardiovascular: Skin warm; not flushed Respiratory: Breaths quiet; no shortness of breath Abdomen: No masses Neurological: Normal sensation to touch Musculoskeletal: Normal motor function arms and legs Lymphatics: No inguinal adenopathy Skin: No rashes Genitourinary: neg s-p area. Neg flank pain.   Laboratory Data:  Results for orders placed or performed during the hospital encounter of 07/23/15 (from the past 72 hour(s))  CBC with Differential     Status: None   Collection Time: 07/23/15  8:17 PM  Result Value Ref Range   WBC 10.2 4.0 - 10.5 K/uL   RBC 4.44 3.87 - 5.11 MIL/uL   Hemoglobin 13.8 12.0 - 15.0 g/dL   HCT 41.2 36.0 -  46.0 %   MCV 92.8 78.0 - 100.0 fL   MCH 31.1 26.0 - 34.0 pg   MCHC 33.5 30.0 - 36.0 g/dL   RDW 13.2 11.5 - 15.5 %   Platelets 219 150 - 400 K/uL   Neutrophils Relative % 75 %   Neutro Abs 7.6 1.7 - 7.7 K/uL   Lymphocytes Relative 17 %   Lymphs Abs 1.8 0.7 - 4.0 K/uL   Monocytes Relative 7 %   Monocytes Absolute 0.7 0.1 - 1.0 K/uL   Eosinophils Relative 1 %   Eosinophils Absolute 0.1 0.0 - 0.7 K/uL   Basophils Relative 0 %   Basophils Absolute 0.0 0.0 - 0.1 K/uL  Basic metabolic panel     Status: Abnormal   Collection Time: 07/23/15  8:17 PM  Result Value Ref Range   Sodium 138 135 - 145 mmol/L    Potassium 3.9 3.5 - 5.1 mmol/L   Chloride 104 101 - 111 mmol/L   CO2 25 22 - 32 mmol/L   Glucose, Bld 93 65 - 99 mg/dL   BUN 22 (H) 6 - 20 mg/dL   Creatinine, Ser 1.39 (H) 0.44 - 1.00 mg/dL   Calcium 8.8 (L) 8.9 - 10.3 mg/dL   GFR calc non Af Amer 41 (L) >60 mL/min   GFR calc Af Amer 48 (L) >60 mL/min    Comment: (NOTE) The eGFR has been calculated using the CKD EPI equation. This calculation has not been validated in all clinical situations. eGFR's persistently <60 mL/min signify possible Chronic Kidney Disease.    Anion gap 9 5 - 15   No results found for this or any previous visit (from the past 240 hour(s)). Creatinine:  Recent Labs  07/23/15 2017  CREATININE 1.39*    Xrays: CLINICAL DATA: Left-sided abdominal pain 2-3 days. Known left ureteral stone.  EXAM: ABDOMEN - 1 VIEW  COMPARISON: 07/12/2015  FINDINGS: Bowel gas pattern is nonobstructive. There is been fragmentation of patient's large proximal left ureteral stone with moderate residual fragments over the proximal ureter. Suggestion of small residual fragments over the distal left ureter. Small stones over the lower pole left kidney. Minimal degenerate change of the spine and hips.  IMPRESSION: Left nephrolithiasis. Interval fragmentation of patient's large proximal left ureteral stone with several prominent residual fragments over the approximate ureter as well small fragments over the distal ureter.  Nonobstructive bowel gas pattern.   Electronically Signed  By: Marin Olp M.D.  On: 07/23/2015 21:12    Impression/Assessment:  Impending [place], with multiple stone pieces in the ureter. We have discussed her surgery, and I have offered her JJ stent in AM, or IV hydration and pain med until her surgery Monday, or she may elect to go home [facility]), and return Monday for her elective surgery. She and her husband have decided to go home and may return if she has another episode of  renal colic. She will be given Toradol prior to discharge.   Plan:   Toradol prior to discharge.   Jadis Mika I Ireene Ballowe 07/23/2015, 10:02 PM

## 2015-07-23 NOTE — Discharge Instructions (Signed)
Return without fail for worsening symptoms including recurrent severe pain, vomiting unable to keep down food or fluids, fever, or any other symptoms concerning to you.  Kidney Stones Kidney stones (urolithiasis) are deposits that form inside your kidneys. The intense pain is caused by the stone moving through the urinary tract. When the stone moves, the ureter goes into spasm around the stone. The stone is usually passed in the urine.  CAUSES   A disorder that makes certain neck glands produce too much parathyroid hormone (primary hyperparathyroidism).  A buildup of uric acid crystals, similar to gout in your joints.  Narrowing (stricture) of the ureter.  A kidney obstruction present at birth (congenital obstruction).  Previous surgery on the kidney or ureters.  Numerous kidney infections. SYMPTOMS   Feeling sick to your stomach (nauseous).  Throwing up (vomiting).  Blood in the urine (hematuria).  Pain that usually spreads (radiates) to the groin.  Frequency or urgency of urination. DIAGNOSIS   Taking a history and physical exam.  Blood or urine tests.  CT scan.  Occasionally, an examination of the inside of the urinary bladder (cystoscopy) is performed. TREATMENT   Observation.  Increasing your fluid intake.  Extracorporeal shock wave lithotripsy--This is a noninvasive procedure that uses shock waves to break up kidney stones.  Surgery may be needed if you have severe pain or persistent obstruction. There are various surgical procedures. Most of the procedures are performed with the use of small instruments. Only small incisions are needed to accommodate these instruments, so recovery time is minimized. The size, location, and chemical composition are all important variables that will determine the proper choice of action for you. Talk to your health care provider to better understand your situation so that you will minimize the risk of injury to yourself and your  kidney.  HOME CARE INSTRUCTIONS   Drink enough water and fluids to keep your urine clear or pale yellow. This will help you to pass the stone or stone fragments.  Strain all urine through the provided strainer. Keep all particulate matter and stones for your health care provider to see. The stone causing the pain may be as small as a grain of salt. It is very important to use the strainer each and every time you pass your urine. The collection of your stone will allow your health care provider to analyze it and verify that a stone has actually passed. The stone analysis will often identify what you can do to reduce the incidence of recurrences.  Only take over-the-counter or prescription medicines for pain, discomfort, or fever as directed by your health care provider.  Keep all follow-up visits as told by your health care provider. This is important.  Get follow-up X-rays if required. The absence of pain does not always mean that the stone has passed. It may have only stopped moving. If the urine remains completely obstructed, it can cause loss of kidney function or even complete destruction of the kidney. It is your responsibility to make sure X-rays and follow-ups are completed. Ultrasounds of the kidney can show blockages and the status of the kidney. Ultrasounds are not associated with any radiation and can be performed easily in a matter of minutes.  Make changes to your daily diet as told by your health care provider. You may be told to:  Limit the amount of salt that you eat.  Eat 5 or more servings of fruits and vegetables each day.  Limit the amount of meat, poultry, fish,  and eggs that you eat.  Collect a 24-hour urine sample as told by your health care provider.You may need to collect another urine sample every 6-12 months. SEEK MEDICAL CARE IF:  You experience pain that is progressive and unresponsive to any pain medicine you have been prescribed. SEEK IMMEDIATE MEDICAL CARE  IF:   Pain cannot be controlled with the prescribed medicine.  You have a fever or shaking chills.  The severity or intensity of pain increases over 18 hours and is not relieved by pain medicine.  You develop a new onset of abdominal pain.  You feel faint or pass out.  You are unable to urinate.   This information is not intended to replace advice given to you by your health care provider. Make sure you discuss any questions you have with your health care provider.   Document Released: 05/21/2005 Document Revised: 02/09/2015 Document Reviewed: 10/22/2012 Elsevier Interactive Patient Education [employer].

## 2015-07-23 NOTE — ED Provider Notes (Signed)
CSN: [id number]     Arrival date & time 07/23/15  1915 History   First MD Initiated Contact with Patient 07/23/15 1942     Chief Complaint  Patient presents with  . Nephrolithiasis     (Consider location/radiation/quality/duration/timing/severity/associated sxs/prior Treatment) HPI 55 year old female who presents with left-sided flank and abdominal pain. History of nephrolithiasis, with known large left ureteral stone measuring 14 x 13 x 21 mm that was diagnosed in early January. She is followed by Dr. Noah Delaine from urology and underwent lithotripsy January 19 with plans for definitive stone removal in 2 days. States that pain has overall been well controlled with home medications, but today had sudden onset of severe sharp pain over the left side of her abdomen with suprapubic pressure nausea and vomiting. Spoke with Dr. Minus Liberty, who was on-call and it was advised that she come to the ED for evaluation. Denies any dysuria, fevers or chills, urinary frequency. Did notice that she had loose stools yesterday. Past Medical History  Diagnosis Date  . Nephrolithiasis   . Cholelithiasis   . Dyspnea on exertion     lower extremity edema  . Hypertension 2006    onset in 2006; controlled with single drug  . GERD (gastroesophageal reflux disease)   . Headache   . Arthritis    Past Surgical History  Procedure Laterality Date  . Cesarean section    . Tubal ligation    . Tonsillectomy    . Colonoscopy with esophagogastroduodenoscopy (egd)  04/25/2012    Procedure: COLONOSCOPY WITH ESOPHAGOGASTRODUODENOSCOPY (EGD);  Surgeon: Rogene Houston, MD;  [facility];  Service: Endoscopy;  Laterality: N/A;  730  . Maloney dilation  04/25/2012    Procedure: MALONEY DILATION;  Surgeon: Rogene Houston, MD;  [facility];  Service: Endoscopy;  Laterality: N/A;  . Dilation and curettage of uterus    . Cholecystectomy N/A 07/31/2013    Procedure: LAPAROSCOPIC CHOLECYSTECTOMY;   Surgeon: Jamesetta So, MD;  Location: [facility] ORS;  Service: General;  Laterality: N/A;  . Liver biopsy N/A 07/31/2013    Procedure: LIVER BIOPSY;  Surgeon: Jamesetta So, MD;  Location: [facility] ORS;  Service: General;  Laterality: N/A;   Family History  Problem Relation Age of Onset  . Stroke Father   . Heart attack Father   . Alzheimer's disease Mother   . Diabetes Brother   . Alzheimer's disease Sister   . Colon cancer Paternal Uncle    Social History  Substance Use Topics  . Smoking status: Former Smoker -- 0.50 packs/day for 4 years    Types: Cigarettes    Quit date: 06/04/1981  . Smokeless tobacco: Never Used  . Alcohol Use: 0.0 oz/week     Comment: Rarely   OB History    No data available     Review of Systems 10/14 systems reviewed and are negative other than those stated in the HPI    Allergies  Bee venom  Home Medications   Prior to Admission medications   Medication Sig Start Date End Date Taking? Authorizing Provider  acetaminophen (TYLENOL) 500 MG tablet Take 1,000 mg by mouth every 6 (six) hours as needed. For pain/fever    Yes Historical Provider, MD  BIOTIN PO Take 1 tablet by mouth daily.   Yes Historical Provider, MD  furosemide (LASIX) 20 MG tablet Take 1 tablet (20 mg total) by mouth daily. Patient taking differently: Take 20 mg by mouth every other day. Takes on days she  doesn't take the torsemide. 06/13/15  Yes Butch Penny, NP  ibuprofen (ADVIL,MOTRIN) 200 MG tablet Take 200 mg by mouth every 6 (six) hours as needed for headache or moderate pain.   Yes Historical Provider, MD  loratadine (CLARITIN) 10 MG tablet Take 10 mg by mouth daily.   Yes Historical Provider, MD  oxyCODONE-acetaminophen (PERCOCET/ROXICET) 5-325 MG tablet Take 0.5-1 tablets by mouth every 4 (four) hours as needed for moderate pain or severe pain.   Yes Historical Provider, MD  pantoprazole (PROTONIX) 40 MG tablet Take 1 tablet (40 mg total) by mouth daily. 04/25/12  Yes Rogene Houston,  MD  phenazopyridine (PYRIDIUM) 100 MG tablet Take 100 mg by mouth 3 (three) times daily as needed for pain.   Yes Historical Provider, MD  raloxifene (EVISTA) 60 MG tablet Take 60 mg by mouth daily.   Yes Historical Provider, MD  tamsulosin (FLOMAX) 0.4 MG CAPS capsule Take 0.4 mg by mouth daily.   Yes Historical Provider, MD  valsartan-hydrochlorothiazide (DIOVAN-HCT) 160-25 MG tablet Take 1 tablet by mouth every morning. 04/20/15  Yes Historical Provider, MD  vitamin E 1000 UNIT capsule Take 1,000 Units by mouth daily.   Yes Historical Provider, MD  cephALEXin (KEFLEX) 500 MG capsule Take 1 capsule (500 mg total) by mouth 2 (two) times daily. 07/23/15   Forde Dandy, MD  torsemide (DEMADEX) 20 MG tablet Take 10 tablets by mouth 3 (three) times a week. 04/20/15   Historical Provider, MD   BP 119/71 mmHg  Pulse 74  Temp(Src) 98.1 F (36.7 C) (Oral)  Resp 19  Ht 5\' 6"  (1.676 m)  Wt 270 lb (122.471 kg)  BMI 43.60 kg/m2  SpO2 97% Physical Exam Physical Exam  Nursing note and vitals reviewed. Constitutional: Well developed, well nourished, non-toxic, and in no acute distress Head: Normocephalic and atraumatic.  Mouth/Throat: Oropharynx is clear and moist.  Neck: Normal range of motion. Neck supple.  Cardiovascular: Normal rate and regular rhythm.   Pulmonary/Chest: Effort normal and breath sounds normal.  Abdominal: Soft. There is mild left sided tenderness and mild left CVA tenderness. There is no rebound and no guarding.  Musculoskeletal: Normal range of motion.  Neurological: Alert, no facial droop, fluent speech, moves all extremities symmetrically Skin: Skin is warm and dry.  Psychiatric: Cooperative  ED Course  Procedures (including critical care time) Labs Review Labs Reviewed  URINALYSIS, ROUTINE W REFLEX MICROSCOPIC (NOT AT [facility]) - Abnormal; Notable for the following:    Color, Urine ORANGE (*)    APPearance CLOUDY (*)    Nitrite POSITIVE (*)    All other components  within normal limits  BASIC METABOLIC PANEL - Abnormal; Notable for the following:    BUN 22 (*)    Creatinine, Ser 1.39 (*)    Calcium 8.8 (*)    GFR calc non Af Amer 41 (*)    GFR calc Af Amer 48 (*)    All other components within normal limits  URINE MICROSCOPIC-ADD ON - Abnormal; Notable for the following:    Squamous Epithelial / LPF 0-5 (*)    Bacteria, UA RARE (*)    All other components within normal limits  URINE CULTURE  CBC WITH DIFFERENTIAL/PLATELET    Imaging Review Dg Abd 1 View  07/23/2015  CLINICAL DATA:  Left-sided abdominal pain 2-3 days. Known left ureteral stone. EXAM: ABDOMEN - 1 VIEW COMPARISON:  07/12/2015 FINDINGS: Bowel gas pattern is nonobstructive. There is been fragmentation of patient's large proximal left ureteral  stone with moderate residual fragments over the proximal ureter. Suggestion of small residual fragments over the distal left ureter. Small stones over the lower pole left kidney. Minimal degenerate change of the spine and hips. IMPRESSION: Left nephrolithiasis. Interval fragmentation of patient's large proximal left ureteral stone with several prominent residual fragments over the approximate ureter as well small fragments over the distal ureter. Nonobstructive bowel gas pattern. Electronically Signed   By: Marin Olp M.D.   On: 07/23/2015 21:12   I have personally reviewed and evaluated these images and lab results as part of my medical decision-making.   EKG Interpretation None      MDM   Final diagnoses:  Ureteral stone    55 year old female with known 14 x 13 x 21 mm left ureteral stone who presents with severe left flank and abdominal pain with nausea and vomiting. States that her pain has been improving since arrival to the ED and she had just taken a Percocet. She is with mild CVA tenderness and left sided abdominal tenderness. Dr. Minus Liberty from urology was present to evaluate patient at bedside, and offered procedural management.  Given that her symptoms had fully resolved, she expressed that she would prefer to be discharged home and have definitive management by Dr. Alyson Ingles on Monday as scheduled. Her kidney function is mildly bumped with a creatinine of 1.32. UA is also positive for nitrites, but rare bacteria and 0 to 5 WBCs. I discussed this with Dr. Minus Liberty, who recommended starting her on Keflex. She is well-appearing without systemic signs of illness, and he feels that she can continue to follow with Dr. Alyson Ingles for definitive management on Monday. I have reviewed strict return instructions with this patients and Dr. Minus Liberty has asked her to return earlier if any recurrence of pain to undergo earlier urological management.  She expressed understanding of all discharge instructions and felt comfortable with the plan of care.   Forde Dandy, MD 07/23/15 2229

## 2015-07-23 NOTE — ED Notes (Signed)
Pt c/o L flank pain, known stone 14x13x21, d/t to have surgery Monday, Dr. Gaynelle Arabian suggested pain come for eval d/t pain. N/v 1hour ago

## 2015-07-25 ENCOUNTER — Ambulatory Visit (HOSPITAL_COMMUNITY): Payer: 59 | Admitting: Certified Registered Nurse Anesthetist

## 2015-07-25 ENCOUNTER — Encounter (HOSPITAL_COMMUNITY)
Admission: RE | Disposition: A | Discharge status: Home / Self Care | Payer: Self-pay | Source: Ambulatory Visit | Attending: Urology

## 2015-07-25 ENCOUNTER — Encounter (HOSPITAL_COMMUNITY): Payer: Self-pay | Admitting: Certified Registered Nurse Anesthetist

## 2015-07-25 ENCOUNTER — Ambulatory Visit (HOSPITAL_COMMUNITY)
Admission: RE | Admit: 2015-07-25 | Discharge: 2015-07-25 | Disposition: A | Discharge status: Home / Self Care | Payer: 59 | Source: Ambulatory Visit | Attending: Urology | Admitting: Urology

## 2015-07-25 DIAGNOSIS — I10 Essential (primary) hypertension: Secondary | ICD-10-CM | POA: Insufficient documentation

## 2015-07-25 DIAGNOSIS — M1990 Unspecified osteoarthritis, unspecified site: Secondary | ICD-10-CM | POA: Diagnosis not present

## 2015-07-25 DIAGNOSIS — K219 Gastro-esophageal reflux disease without esophagitis: Secondary | ICD-10-CM | POA: Insufficient documentation

## 2015-07-25 DIAGNOSIS — Z6841 Body Mass Index (BMI) 40.0 and over, adult: Secondary | ICD-10-CM | POA: Insufficient documentation

## 2015-07-25 DIAGNOSIS — Z87891 Personal history of nicotine dependence: Secondary | ICD-10-CM | POA: Insufficient documentation

## 2015-07-25 DIAGNOSIS — Z9049 Acquired absence of other specified parts of digestive tract: Secondary | ICD-10-CM | POA: Insufficient documentation

## 2015-07-25 DIAGNOSIS — N201 Calculus of ureter: Secondary | ICD-10-CM | POA: Insufficient documentation

## 2015-07-25 DIAGNOSIS — Z79899 Other long term (current) drug therapy: Secondary | ICD-10-CM | POA: Insufficient documentation

## 2015-07-25 DIAGNOSIS — Z87442 Personal history of urinary calculi: Secondary | ICD-10-CM | POA: Diagnosis not present

## 2015-07-25 HISTORY — PX: HOLMIUM LASER APPLICATION: SHX5852

## 2015-07-25 HISTORY — PX: CYSTOSCOPY WITH RETROGRADE PYELOGRAM, URETEROSCOPY AND STENT PLACEMENT: SHX5789

## 2015-07-25 SURGERY — CYSTOURETEROSCOPY, WITH RETROGRADE PYELOGRAM AND STENT INSERTION
Anesthesia: General | Site: Ureter | Laterality: Left

## 2015-07-25 MED ORDER — OXYCODONE-ACETAMINOPHEN 5-325 MG PO TABS: 0.5000 | ORAL_TABLET | ORAL | Status: DC | PRN

## 2015-07-25 MED ORDER — FENTANYL CITRATE (PF) 100 MCG/2ML IJ SOLN
INTRAMUSCULAR | Status: DC | PRN
  Administered 2015-07-25: 25 ug via INTRAVENOUS
  Administered 2015-07-25: 50 ug via INTRAVENOUS
  Administered 2015-07-25: 25 ug via INTRAVENOUS
  Administered 2015-07-25 (×3): 50 ug via INTRAVENOUS
  Administered 2015-07-25 (×3): 25 ug via INTRAVENOUS

## 2015-07-25 MED ORDER — FENTANYL CITRATE (PF) 100 MCG/2ML IJ SOLN
INTRAMUSCULAR | Status: AC
  Filled 2015-07-25: qty 2

## 2015-07-25 MED ORDER — OXYCODONE HCL 5 MG/5ML PO SOLN
5.0000 mg | Freq: Once | ORAL | Status: DC | PRN
  Filled 2015-07-25: qty 5

## 2015-07-25 MED ORDER — ONDANSETRON HCL 4 MG/2ML IJ SOLN
INTRAMUSCULAR | Status: DC | PRN
  Administered 2015-07-25: 4 mg via INTRAVENOUS

## 2015-07-25 MED ORDER — ACETAMINOPHEN 325 MG PO TABS: 325.0000 mg | ORAL_TABLET | ORAL | Status: DC | PRN

## 2015-07-25 MED ORDER — LACTATED RINGERS IV SOLN
INTRAVENOUS | Status: DC | PRN
  Administered 2015-07-25: 13:00:00 via INTRAVENOUS

## 2015-07-25 MED ORDER — SODIUM CHLORIDE 0.9 % IR SOLN
Status: DC | PRN
  Administered 2015-07-25: 5000 mL

## 2015-07-25 MED ORDER — LIDOCAINE HCL (CARDIAC) 20 MG/ML IV SOLN
INTRAVENOUS | Status: DC | PRN
  Administered 2015-07-25: 100 mg via INTRAVENOUS

## 2015-07-25 MED ORDER — PROPOFOL 10 MG/ML IV BOLUS
INTRAVENOUS | Status: AC
  Filled 2015-07-25: qty 20

## 2015-07-25 MED ORDER — PROPOFOL 10 MG/ML IV BOLUS
INTRAVENOUS | Status: DC | PRN
  Administered 2015-07-25: 30 mg via INTRAVENOUS
  Administered 2015-07-25: 200 mg via INTRAVENOUS
  Administered 2015-07-25: 40 mg via INTRAVENOUS
  Administered 2015-07-25: 30 mg via INTRAVENOUS

## 2015-07-25 MED ORDER — OXYCODONE HCL 5 MG PO TABS: 5.0000 mg | ORAL_TABLET | Freq: Once | ORAL | Status: DC | PRN

## 2015-07-25 MED ORDER — DEXTROSE 5 % IV SOLN
INTRAVENOUS | Status: AC
  Filled 2015-07-25: qty 2

## 2015-07-25 MED ORDER — EPHEDRINE SULFATE 50 MG/ML IJ SOLN
INTRAMUSCULAR | Status: DC | PRN
  Administered 2015-07-25 (×2): 10 mg via INTRAVENOUS

## 2015-07-25 MED ORDER — ONDANSETRON HCL 4 MG/2ML IJ SOLN
INTRAMUSCULAR | Status: AC
  Filled 2015-07-25: qty 2

## 2015-07-25 MED ORDER — MIDAZOLAM HCL 2 MG/2ML IJ SOLN
INTRAMUSCULAR | Status: AC
  Filled 2015-07-25: qty 2

## 2015-07-25 MED ORDER — MIDAZOLAM HCL 5 MG/5ML IJ SOLN
INTRAMUSCULAR | Status: DC | PRN
  Administered 2015-07-25: 2 mg via INTRAVENOUS

## 2015-07-25 MED ORDER — BELLADONNA ALKALOIDS-OPIUM 16.2-60 MG RE SUPP
RECTAL | Status: AC
  Filled 2015-07-25: qty 1

## 2015-07-25 MED ORDER — ACETAMINOPHEN 160 MG/5ML PO SOLN: 325.0000 mg | ORAL | Status: DC | PRN

## 2015-07-25 MED ORDER — DEXAMETHASONE SODIUM PHOSPHATE 10 MG/ML IJ SOLN
INTRAMUSCULAR | Status: DC | PRN
  Administered 2015-07-25: 10 mg via INTRAVENOUS

## 2015-07-25 MED ORDER — TAMSULOSIN HCL 0.4 MG PO CAPS: 0.4000 mg | ORAL_CAPSULE | Freq: Every day | ORAL | Status: DC

## 2015-07-25 MED ORDER — IOHEXOL 300 MG/ML  SOLN
INTRAMUSCULAR | Status: DC | PRN
  Administered 2015-07-25: 14 mL

## 2015-07-25 MED ORDER — DEXAMETHASONE SODIUM PHOSPHATE 10 MG/ML IJ SOLN
INTRAMUSCULAR | Status: AC
  Filled 2015-07-25: qty 1

## 2015-07-25 MED ORDER — FENTANYL CITRATE (PF) 100 MCG/2ML IJ SOLN: 25.0000 ug | INTRAMUSCULAR | Status: DC | PRN

## 2015-07-25 MED ORDER — DEXTROSE 5 % IV SOLN
2.0000 g | Freq: Once | INTRAVENOUS | Status: AC
  Administered 2015-07-25: 2 g via INTRAVENOUS

## 2015-07-25 MED ORDER — LIDOCAINE HCL (CARDIAC) 20 MG/ML IV SOLN
INTRAVENOUS | Status: AC
  Filled 2015-07-25: qty 5

## 2015-07-25 MED FILL — TAMSULOSIN HCL 0.4 MG CAP: 0.4 | 30 days supply | Qty: 30 | Fill #0

## 2015-07-25 MED FILL — OXYCODONE/APAP 5/325MG: 5-325 | 5 days supply | Qty: 30 | Fill #0

## 2015-07-25 SURGICAL SUPPLY — 22 items
BAG URO CATCHER STRL LF (MISCELLANEOUS) ×3 IMPLANT
BASKET DAKOTA 1.9FR 11X120 (BASKET) ×2 IMPLANT
CATH FOLEY 2WAY  3CC  8FR (CATHETERS) ×2
CATH FOLEY 2WAY 3CC 8FR (CATHETERS) IMPLANT
CATH INTERMIT  6FR 70CM (CATHETERS) ×3 IMPLANT
CLOTH BEACON ORANGE TIMEOUT ST (SAFETY) ×3 IMPLANT
FIBER LASER FLEXIVA 1000 (UROLOGICAL SUPPLIES) IMPLANT
FIBER LASER FLEXIVA 200 (UROLOGICAL SUPPLIES) ×2 IMPLANT
FIBER LASER FLEXIVA 365 (UROLOGICAL SUPPLIES) IMPLANT
FIBER LASER FLEXIVA 550 (UROLOGICAL SUPPLIES) IMPLANT
FIBER LASER TRAC TIP (UROLOGICAL SUPPLIES) IMPLANT
GLOVE BIOGEL M 8.0 STRL (GLOVE) ×3 IMPLANT
GOWN STRL REUS W/TWL LRG LVL3 (GOWN DISPOSABLE) ×6 IMPLANT
GUIDEWIRE ANG ZIPWIRE 038X150 (WIRE) ×2 IMPLANT
GUIDEWIRE STR DUAL SENSOR (WIRE) ×3 IMPLANT
MANIFOLD NEPTUNE II (INSTRUMENTS) ×3 IMPLANT
PACK CYSTO (CUSTOM PROCEDURE TRAY) ×3 IMPLANT
SHEATH ACCESS URETERAL 38CM (SHEATH) IMPLANT
STENT CONTOUR 6FRX26X.038 (STENTS) ×2 IMPLANT
SYRINGE 10CC LL (SYRINGE) ×2 IMPLANT
TUBING CONNECTING 10 (TUBING) ×2 IMPLANT
TUBING CONNECTING 10' (TUBING) ×1

## 2015-07-25 NOTE — Anesthesia Preprocedure Evaluation (Signed)
Anesthesia Evaluation  Patient identified by MRN, date of birth, ID band Patient awake    Reviewed: Allergy & Precautions, NPO status , Patient's Chart, lab work & pertinent test results  History of Anesthesia Complications (+) history of anesthetic complications  Airway Mallampati: III  TM Distance: >3 FB Neck ROM: Full    Dental  (+) Teeth Intact   Pulmonary shortness of breath and with exertion, former smoker,    breath sounds clear to auscultation       Cardiovascular hypertension, Pt. on medications  Rhythm:Regular     Neuro/Psych  Headaches,    GI/Hepatic Neg liver ROS, GERD  Medicated and Controlled,  Endo/Other  Morbid obesity  Renal/GU Renal InsufficiencyRenal disease     Musculoskeletal  (+) Arthritis ,   Abdominal   Peds  Hematology negative hematology ROS (+)   Anesthesia Other Findings   Reproductive/Obstetrics                             Anesthesia Physical Anesthesia Plan  ASA: II  Anesthesia Plan: General   Post-op Pain Management:    Induction: Intravenous  Airway Management Planned: Oral ETT and LMA  Additional Equipment: None  Intra-op Plan:   Post-operative Plan: Extubation in OR  Informed Consent: I have reviewed the patients History and Physical, chart, labs and discussed the procedure including the risks, benefits and alternatives for the proposed anesthesia with the patient or authorized representative who has indicated his/her understanding and acceptance.   Dental advisory given  Plan Discussed with: CRNA and Surgeon  Anesthesia Plan Comments:         Anesthesia Quick Evaluation

## 2015-07-25 NOTE — Anesthesia Postprocedure Evaluation (Signed)
Anesthesia Post Note  Patient: SHAELYN JOPLIN  Procedure(s) Performed: Procedure(s) (LRB): CYSTOSCOPY WITH LEFT RETROGRADE, (Left) LEFT HOLMIUM LASER STONE EXTRACTION  AND LEFT STENT PLACEMENT (Left)  Patient location during evaluation: PACU Anesthesia Type: General Level of consciousness: awake Pain management: pain level controlled Vital Signs Assessment: post-procedure vital signs reviewed and stable Respiratory status: spontaneous breathing Cardiovascular status: stable Postop Assessment: no signs of nausea or vomiting Anesthetic complications: no    Last Vitals:  Filed Vitals:   07/25/15 1600 07/25/15 1635  BP: 118/51 104/64  Pulse: 77 84  Temp: 36.7 C   Resp: 18 20    Last Pain:  Filed Vitals:   07/25/15 1710  PainSc: 0-No pain                 Alsace Dowd

## 2015-07-25 NOTE — Transfer of Care (Signed)
Immediate Anesthesia Transfer of Care Note  Patient: Marilyn Martin  Procedure(s) Performed: Procedure(s): CYSTOSCOPY WITH LEFT RETROGRADE, (Left) LEFT HOLMIUM LASER STONE EXTRACTION  AND LEFT STENT PLACEMENT (Left)  Patient Location: PACU  Anesthesia Type:General  Level of Consciousness: awake, alert , oriented and patient cooperative  Airway & Oxygen Therapy: Patient Spontanous Breathing and Patient connected to face mask oxygen  Post-op Assessment: Report given to RN, Post -op Vital signs reviewed and stable and Patient moving all extremities  Post vital signs: Reviewed and stable  Last Vitals:  Filed Vitals:   07/25/15 1152  BP: 103/68  Pulse: 77  Temp: 36.6 C  Resp: 18    Complications: No apparent anesthesia complications

## 2015-07-25 NOTE — Anesthesia Preprocedure Evaluation (Deleted)
Anesthesia Evaluation    Airway       Dental   Pulmonary former smoker,          Cardiovascular hypertension,     Neuro/Psych    GI/Hepatic   Endo/Other    Renal/GU      Musculoskeletal   Abdominal   Peds  Hematology   Anesthesia Other Findings   Reproductive/Obstetrics                          Anesthesia Physical Anesthesia Plan Anesthesia Quick Evaluation  

## 2015-07-25 NOTE — Discharge Instructions (Signed)
Ureteroscopy, Care After °Refer to this sheet in the next few weeks. These instructions provide you with information on caring for yourself after your procedure. Your health care provider may also give you more specific instructions. Your treatment has been planned according to current medical practices, but problems sometimes occur. Call your health care provider if you have any problems or questions after your procedure.  °WHAT TO EXPECT AFTER THE PROCEDURE  °After your procedure, it is typical to have the following:  °· A burning sensation when you urinate. °· Blood in your urine. °HOME CARE INSTRUCTIONS  °· Only take medicines as directed by your health care provider. Do not take any over-the-counter pain medication unless your health care provider says it is okay. °· Take a warm bath or hold a warm washcloth over your groin to relieve burning. °· Drink enough fluids to keep your urine clear or pale yellow. °¨ Drink two 8-ounce glasses of water every hour for the first 2 hours after you get home. °¨ Continue to drink water often at home. °· You can eat what you usually do. °· Ask your surgeon when you can do your usual activities. °· If you had a tube placed to keep urine flowing (ureteral stent), ask your health care provider when you need to return to have it removed. °· Keep all follow-up appointments. °SEEK MEDICAL CARE IF:  °· You have chills or fever. °· You have burning pain for longer than 24 hours after the procedure. °· You have blood in your urine for longer than 24 hours after the procedure. °SEEK IMMEDIATE MEDICAL CARE IF:  °· You have large amounts of blood or clots in your urine. °· You have very bad pain. °· You have chest pain or trouble breathing. °  °This information is not intended to replace advice given to you by your health care provider. Make sure you discuss any questions you have with your health care provider. °  °Document Released: 05/26/2013 Document Reviewed: 05/26/2013 °Elsevier  Interactive Patient Education ©2016 [employer] ° °

## 2015-07-25 NOTE — Brief Op Note (Signed)
07/25/2015  3:04 PM  PATIENT:  Marilyn Martin  55 y.o. female  PRE-OPERATIVE DIAGNOSIS:  left ureteral stone  POST-OPERATIVE DIAGNOSIS:  left ureteral stone  PROCEDURE:  Procedure(s): CYSTOSCOPY WITH LEFT RETROGRADE, (Left) LEFT HOLMIUM LASER STONE EXTRACTION  AND LEFT STENT PLACEMENT (Left)  SURGEON:  Surgeon(s) and Role:    * Cleon Gustin, MD - Primary  PHYSICIAN ASSISTANT:   ASSISTANTS: none   ANESTHESIA:   general  EBL:     BLOOD ADMINISTERED:none  DRAINS: left 6x266 JJ ureteral stent  LOCAL MEDICATIONS USED:  NONE  SPECIMEN:  Source of Specimen:  stone  DISPOSITION OF SPECIMEN:  N/A  COUNTS:  YES  TOURNIQUET:  * No tourniquets in log *  DICTATION: .Note written in EPIC  PLAN OF CARE: Discharge to home after PACU  PATIENT DISPOSITION:  PACU - hemodynamically stable.   Delay start of Pharmacological VTE agent (>24hrs) due to surgical blood loss or risk of bleeding: not applicable

## 2015-07-25 NOTE — Anesthesia Procedure Notes (Signed)
Procedure Name: LMA Insertion Date/Time: 07/25/2015 1:34 PM Performed by: Maxwell Caul Pre-anesthesia Checklist: Patient identified, Emergency Drugs available, Suction available and Patient being monitored Patient Re-evaluated:Patient Re-evaluated prior to inductionOxygen Delivery Method: Circle system utilized Preoxygenation: Pre-oxygenation with 100% oxygen Intubation Type: IV induction LMA: LMA inserted LMA Size: 4.0 Number of attempts: 1 Placement Confirmation: positive ETCO2 and breath sounds checked- equal and bilateral Tube secured with: Tape Dental Injury: Teeth and Oropharynx as per pre-operative assessment

## 2015-07-25 NOTE — H&P (Signed)
Urology Admission H&P  Chief Complaint: left flank pain  History of Present Illness: Ms Hodson is a 55yo with a hx of nephrolithiasis here for left ureteroscopic stone extraction. She underwent ESWL and has not passed all of the fragemnts. 2 days ago she presented to the ER with severe left flank pain. She was given toradol and narcotics and was then discharged. Today she has mild left flank pain  Past Medical History  Diagnosis Date  . Nephrolithiasis   . Cholelithiasis   . Dyspnea on exertion     lower extremity edema  . Hypertension 2006    onset in 2006; controlled with single drug  . GERD (gastroesophageal reflux disease)   . Headache   . Arthritis    Past Surgical History  Procedure Laterality Date  . Cesarean section    . Tubal ligation    . Tonsillectomy    . Colonoscopy with esophagogastroduodenoscopy (egd)  04/25/2012    Procedure: COLONOSCOPY WITH ESOPHAGOGASTRODUODENOSCOPY (EGD);  Surgeon: Rogene Houston, MD;  Location: [facility];  Service: Endoscopy;  Laterality: N/A;  730  . Maloney dilation  04/25/2012    Procedure: MALONEY DILATION;  Surgeon: Rogene Houston, MD;  Location: [facility];  Service: Endoscopy;  Laterality: N/A;  . Dilation and curettage of uterus    . Cholecystectomy N/A 07/31/2013    Procedure: LAPAROSCOPIC CHOLECYSTECTOMY;  Surgeon: Jamesetta So, MD;  Location: [facility];  Service: General;  Laterality: N/A;  . Liver biopsy N/A 07/31/2013    Procedure: LIVER BIOPSY;  Surgeon: Jamesetta So, MD;  Location: [facility];  Service: General;  Laterality: N/A;    Home Medications:  Prescriptions prior to admission  Medication Sig Dispense Refill Last Dose  . acetaminophen (TYLENOL) 500 MG tablet Take 1,000 mg by mouth every 6 (six) hours as needed. For pain/fever    07/23/2015  . BIOTIN PO Take 1 tablet by mouth daily.   07/21/2015  . furosemide (LASIX) 20 MG tablet Take 1 tablet (20 mg total) by mouth daily. (Patient taking differently: Take 20 mg by  mouth every other day. Takes on days she doesn't take the torsemide.) 90 tablet 4 07/24/2015 at am  . ibuprofen (ADVIL,MOTRIN) 200 MG tablet Take 200 mg by mouth every 6 (six) hours as needed for headache or moderate pain.   Past Month at Unknown time  . loratadine (CLARITIN) 10 MG tablet Take 10 mg by mouth daily.   07/19/2015  . oxyCODONE-acetaminophen (PERCOCET/ROXICET) 5-325 MG tablet Take 0.5-1 tablets by mouth every 4 (four) hours as needed for moderate pain or severe pain.   07/24/2015 at 2200  . pantoprazole (PROTONIX) 40 MG tablet Take 1 tablet (40 mg total) by mouth daily. 90 tablet 3 07/19/2015  . phenazopyridine (PYRIDIUM) 100 MG tablet Take 100 mg by mouth 3 (three) times daily as needed for pain.   07/24/2015 at 2200  . raloxifene (EVISTA) 60 MG tablet Take 60 mg by mouth daily.   07/21/2015  . tamsulosin (FLOMAX) 0.4 MG CAPS capsule Take 0.4 mg by mouth daily.   07/24/2015 at am  . torsemide (DEMADEX) 20 MG tablet Take 10 tablets by mouth 3 (three) times a week.  3 07/19/2015  . valsartan-hydrochlorothiazide (DIOVAN-HCT) 160-25 MG tablet Take 1 tablet by mouth every morning.  4 07/24/2015 at am  . vitamin E 1000 UNIT capsule Take 1,000 Units by mouth daily.   07/21/2015  . cephALEXin (KEFLEX) 500 MG capsule Take 1 capsule (500 mg total)  by mouth 2 (two) times daily. 20 capsule 0 07/24/2015 at 1930   Allergies:  Allergies  Allergen Reactions  . Bee Venom Swelling    Family History  Problem Relation Age of Onset  . Stroke Father   . Heart attack Father   . Alzheimer's disease Mother   . Diabetes Brother   . Alzheimer's disease Sister   . Colon cancer Paternal Uncle    Social History:  reports that she quit smoking about 34 years ago. Her smoking use included Cigarettes. She has a 2 pack-year smoking history. She has never used smokeless tobacco. She reports that she drinks alcohol. She reports that she does not use illicit drugs.  Review of Systems  Genitourinary: Positive for flank  pain.  All other systems reviewed and are negative.   Physical Exam:  Vital signs in last 24 hours: Temp:  [97.8 F (36.6 C)] 97.8 F (36.6 C) (02/20 1152) Pulse Rate:  [77] 77 (02/20 1152) Resp:  [18] 18 (02/20 1152) BP: (103)/(68) 103/68 mmHg (02/20 1152) SpO2:  [99 %] 99 % (02/20 1152) Weight:  [122.471 kg (270 lb)] 122.471 kg (270 lb) (02/20 1244) Physical Exam  Constitutional: She is oriented to person, place, and time. She appears well-developed and well-nourished.  HENT:  Head: Normocephalic and atraumatic.  Eyes: EOM are normal. Pupils are equal, round, and reactive to light.  Neck: Normal range of motion. No thyromegaly present.  Cardiovascular: Normal rate and regular rhythm.   Respiratory: Effort normal. No respiratory distress.  GI: Soft. She exhibits no distension and no mass. There is no tenderness. There is no rebound and no guarding.  Musculoskeletal: Normal range of motion.  Neurological: She is alert and oriented to person, place, and time.  Skin: Skin is warm and dry.  Psychiatric: She has a normal mood and affect. Her behavior is normal. Judgment and thought content normal.    Laboratory Data:  No results found for this or any previous visit (from the past 24 hour(s)). No results found for this or any previous visit (from the past 240 hour(s)). Creatinine:  Recent Labs  07/23/15 2017  CREATININE 1.39*   Baseline Creatinine: unknown  Impression/Assessment:  54yo with a left ureteral calculus  Plan:  The risks/benefits/alternatives to left ureteroscopic stone extraction was explained to the patient and she understands and wishes to proceed with surgery.  Louden Houseworth L 07/25/2015, 1:02 PM

## 2015-07-26 ENCOUNTER — Other Ambulatory Visit: Payer: Self-pay | Admitting: Urology

## 2015-07-26 DIAGNOSIS — N201 Calculus of ureter: Secondary | ICD-10-CM | POA: Diagnosis not present

## 2015-07-26 LAB — URINE CULTURE

## 2015-07-26 NOTE — Op Note (Signed)
.  Preoperative diagnosis: Left ureteral stone  Postoperative diagnosis: Same  Procedure: 1 cystoscopy 2. Left retrograde pyelography 3.  Intraoperative fluoroscopy, under one hour, with interpretation 4.  Left ureteroscopic stone manipulation with laser lithotripsy 5.  Left 6 x 26 JJ stent placement  Attending: Rosie Fate  Anesthesia: General  Estimated blood loss: None  Drains: Left 6 x 26 JJ ureteral stent without tether  Specimens: stone for analysis  Antibiotics: Rocephin  Findings: left impacted proximal ureteral stone with moderate hydronephrosis. No masses/lesions in the bladder. Ureteral orifices in normal anatomic location.  Indications: Patient is a 55 year old female with a history of left ureteral stone and who has persistent left flank pain.  After discussing treatment options, she decided proceed with left ureteroscopic stone manipulation.  Procedure her in detail: The patient was brought to the operating room and a brief timeout was done to ensure correct patient, correct procedure, correct site.  General anesthesia was administered patient was placed in dorsal lithotomy position.  Her genitalia was then prepped and draped in usual sterile fashion.  A rigid 66 French cystoscope was passed in the urethra and the bladder.  Bladder was inspected free masses or lesions.  the ureteral orifices were in the normal orthotopic locations.  a 6 french ureteral catheter was then instilled into the left ureteral orifice.  a gentle retrograde was obtained and findings noted above.  we then placed a zip wire through the ureteral catheter and advanced up to the renal pelvis.  we then removed the cystoscope and cannulated the left ureteral orifice with a semirigid ureteroscope.  We encountered multiple distal ureteral calculi which were removed with a dakota basket. We then encountered a large impacted calculus in the proximal ureter. Using a 200nm laser fiber wer fragmented the stone  and removed the majority of the stone that was in the wall of the ureter. We then encountered significant bleeding making visualization difficult and had to stop the procedure and place a stent. we then placed a 6 x 26 double-j ureteral stent over the original zip wire. We then removed the wire and good coil was noted in the the renal pelvis under fluoroscopy and the bladder under direct vision.     the bladder was then drained and this concluded the procedure which was well tolerated by patient.  Complications: None  Condition: Stable, extubated, transferred to PACU  Plan: Patient is to be discharged home as to follow-up in 2 weeks for repeat ureteroscopic stone extraction.

## 2015-08-01 ENCOUNTER — Encounter (HOSPITAL_BASED_OUTPATIENT_CLINIC_OR_DEPARTMENT_OTHER): Payer: Self-pay | Admitting: *Deleted

## 2015-08-01 NOTE — Progress Notes (Signed)
NPO AFTER MN WITH EXCEPTION CLEAR LIQUIDS UNTIL 0830 (NO CREAM/ MILK PRODUCTS).  ARRIVE AT 1230. CURRENT LAB RESULTS AND EKG IN CHART AND EPIC.  WILL TAKE PROTONIX AND FLOMAX AM DOS W/ SIPS OF WATER.

## 2015-08-03 ENCOUNTER — Ambulatory Visit: Payer: 59 | Admitting: Urology

## 2015-08-08 ENCOUNTER — Encounter (HOSPITAL_BASED_OUTPATIENT_CLINIC_OR_DEPARTMENT_OTHER)
Admission: RE | Disposition: A | Discharge status: Home / Self Care | Payer: Self-pay | Source: Ambulatory Visit | Attending: Urology

## 2015-08-08 ENCOUNTER — Encounter (HOSPITAL_BASED_OUTPATIENT_CLINIC_OR_DEPARTMENT_OTHER): Payer: Self-pay | Admitting: *Deleted

## 2015-08-08 ENCOUNTER — Ambulatory Visit (HOSPITAL_BASED_OUTPATIENT_CLINIC_OR_DEPARTMENT_OTHER): Payer: 59 | Admitting: Anesthesiology

## 2015-08-08 ENCOUNTER — Ambulatory Visit (HOSPITAL_BASED_OUTPATIENT_CLINIC_OR_DEPARTMENT_OTHER)
Admission: RE | Admit: 2015-08-08 | Discharge: 2015-08-08 | Disposition: A | Discharge status: Home / Self Care | Payer: 59 | Source: Ambulatory Visit | Attending: Urology | Admitting: Urology

## 2015-08-08 DIAGNOSIS — Z9851 Tubal ligation status: Secondary | ICD-10-CM | POA: Insufficient documentation

## 2015-08-08 DIAGNOSIS — Z87891 Personal history of nicotine dependence: Secondary | ICD-10-CM | POA: Diagnosis not present

## 2015-08-08 DIAGNOSIS — Z91030 Bee allergy status: Secondary | ICD-10-CM | POA: Diagnosis not present

## 2015-08-08 DIAGNOSIS — M1990 Unspecified osteoarthritis, unspecified site: Secondary | ICD-10-CM | POA: Insufficient documentation

## 2015-08-08 DIAGNOSIS — I10 Essential (primary) hypertension: Secondary | ICD-10-CM | POA: Insufficient documentation

## 2015-08-08 DIAGNOSIS — Z79899 Other long term (current) drug therapy: Secondary | ICD-10-CM | POA: Insufficient documentation

## 2015-08-08 DIAGNOSIS — K219 Gastro-esophageal reflux disease without esophagitis: Secondary | ICD-10-CM | POA: Diagnosis not present

## 2015-08-08 DIAGNOSIS — N201 Calculus of ureter: Secondary | ICD-10-CM | POA: Diagnosis not present

## 2015-08-08 DIAGNOSIS — N200 Calculus of kidney: Secondary | ICD-10-CM | POA: Insufficient documentation

## 2015-08-08 HISTORY — DX: Personal history of urinary calculi: Z87.442

## 2015-08-08 HISTORY — DX: Personal history of colonic polyps: Z86.010

## 2015-08-08 HISTORY — DX: Personal history of colon polyps, unspecified: Z86.0100

## 2015-08-08 HISTORY — DX: Personal history of other diseases of the digestive system: Z87.19

## 2015-08-08 HISTORY — DX: Calculus of ureter: N20.1

## 2015-08-08 HISTORY — PX: CYSTOSCOPY W/ URETERAL STENT PLACEMENT: SHX1429

## 2015-08-08 HISTORY — PX: HOLMIUM LASER APPLICATION: SHX5852

## 2015-08-08 HISTORY — DX: Localized edema: R60.0

## 2015-08-08 HISTORY — PX: CYSTOSCOPY/RETROGRADE/URETEROSCOPY/STONE EXTRACTION WITH BASKET: SHX5317

## 2015-08-08 SURGERY — CYSTOSCOPY, WITH CALCULUS REMOVAL USING BASKET
Anesthesia: General | Site: Ureter | Laterality: Left

## 2015-08-08 MED ORDER — PHENYLEPHRINE 40 MCG/ML (10ML) SYRINGE FOR IV PUSH (FOR BLOOD PRESSURE SUPPORT)
PREFILLED_SYRINGE | INTRAVENOUS | Status: AC
  Filled 2015-08-08: qty 10

## 2015-08-08 MED ORDER — DEXAMETHASONE SODIUM PHOSPHATE 4 MG/ML IJ SOLN
INTRAMUSCULAR | Status: DC | PRN
  Administered 2015-08-08: 10 mg via INTRAVENOUS

## 2015-08-08 MED ORDER — MIDAZOLAM HCL 2 MG/2ML IJ SOLN
INTRAMUSCULAR | Status: AC
  Filled 2015-08-08: qty 2

## 2015-08-08 MED ORDER — SODIUM CHLORIDE 0.9 % IR SOLN
Status: DC | PRN
  Administered 2015-08-08: 4000 mL

## 2015-08-08 MED ORDER — FENTANYL CITRATE (PF) 100 MCG/2ML IJ SOLN
INTRAMUSCULAR | Status: DC | PRN
  Administered 2015-08-08 (×2): 50 ug via INTRAVENOUS

## 2015-08-08 MED ORDER — DEXTROSE 5 % IV SOLN
INTRAVENOUS | Status: AC
  Filled 2015-08-08: qty 50

## 2015-08-08 MED ORDER — PROMETHAZINE HCL 25 MG/ML IJ SOLN
6.2500 mg | INTRAMUSCULAR | Status: DC | PRN
Start: 2015-08-08 — End: 2015-08-08
  Filled 2015-08-08: qty 1

## 2015-08-08 MED ORDER — DEXTROSE 5 % IV SOLN
2.0000 g | INTRAVENOUS | Status: AC
  Administered 2015-08-08: 2 g via INTRAVENOUS
  Filled 2015-08-08: qty 2

## 2015-08-08 MED ORDER — LACTATED RINGERS IV SOLN
INTRAVENOUS | Status: DC
  Administered 2015-08-08 (×2): via INTRAVENOUS
  Filled 2015-08-08: qty 1000

## 2015-08-08 MED ORDER — PHENYLEPHRINE HCL 10 MG/ML IJ SOLN
INTRAMUSCULAR | Status: DC | PRN
  Administered 2015-08-08 (×2): 80 ug via INTRAVENOUS

## 2015-08-08 MED ORDER — KETOROLAC TROMETHAMINE 30 MG/ML IJ SOLN
INTRAMUSCULAR | Status: DC | PRN
  Administered 2015-08-08: 30 mg via INTRAVENOUS

## 2015-08-08 MED ORDER — LIDOCAINE HCL (CARDIAC) 20 MG/ML IV SOLN
INTRAVENOUS | Status: AC
  Filled 2015-08-08: qty 5

## 2015-08-08 MED ORDER — BELLADONNA ALKALOIDS-OPIUM 16.2-60 MG RE SUPP
RECTAL | Status: AC
Start: 2015-08-08 — End: 2015-08-08
  Filled 2015-08-08: qty 1

## 2015-08-08 MED ORDER — PROPOFOL 10 MG/ML IV BOLUS
INTRAVENOUS | Status: AC
  Filled 2015-08-08: qty 40

## 2015-08-08 MED ORDER — LIDOCAINE HCL (CARDIAC) 20 MG/ML IV SOLN
INTRAVENOUS | Status: DC | PRN
  Administered 2015-08-08: 100 mg via INTRAVENOUS

## 2015-08-08 MED ORDER — ONDANSETRON HCL 4 MG/2ML IJ SOLN
INTRAMUSCULAR | Status: DC | PRN
  Administered 2015-08-08: 4 mg via INTRAVENOUS

## 2015-08-08 MED ORDER — IOHEXOL 350 MG/ML SOLN
INTRAVENOUS | Status: DC | PRN
  Administered 2015-08-08: 7 mL

## 2015-08-08 MED ORDER — MEPERIDINE HCL 25 MG/ML IJ SOLN
6.2500 mg | INTRAMUSCULAR | Status: DC | PRN
  Filled 2015-08-08: qty 1

## 2015-08-08 MED ORDER — FENTANYL CITRATE (PF) 100 MCG/2ML IJ SOLN
25.0000 ug | INTRAMUSCULAR | Status: DC | PRN
  Filled 2015-08-08: qty 1

## 2015-08-08 MED ORDER — ONDANSETRON HCL 4 MG/2ML IJ SOLN
INTRAMUSCULAR | Status: AC
  Filled 2015-08-08: qty 2

## 2015-08-08 MED ORDER — CEFTRIAXONE SODIUM 2 G IJ SOLR
INTRAMUSCULAR | Status: AC
  Filled 2015-08-08: qty 2

## 2015-08-08 MED ORDER — OXYCODONE-ACETAMINOPHEN 5-325 MG PO TABS: 0.5000 | ORAL_TABLET | ORAL | Status: DC | PRN

## 2015-08-08 MED ORDER — LACTATED RINGERS IV SOLN
INTRAVENOUS | Status: DC
  Filled 2015-08-08: qty 1000

## 2015-08-08 MED ORDER — FENTANYL CITRATE (PF) 100 MCG/2ML IJ SOLN
INTRAMUSCULAR | Status: AC
Start: 2015-08-08 — End: 2015-08-08
  Filled 2015-08-08: qty 2

## 2015-08-08 MED ORDER — PROPOFOL 10 MG/ML IV BOLUS
INTRAVENOUS | Status: DC | PRN
  Administered 2015-08-08: 300 mg via INTRAVENOUS

## 2015-08-08 MED ORDER — MIDAZOLAM HCL 5 MG/5ML IJ SOLN
INTRAMUSCULAR | Status: DC | PRN
  Administered 2015-08-08: 2 mg via INTRAVENOUS

## 2015-08-08 MED ORDER — KETOROLAC TROMETHAMINE 30 MG/ML IJ SOLN
INTRAMUSCULAR | Status: AC
  Filled 2015-08-08: qty 1

## 2015-08-08 MED ORDER — DEXAMETHASONE SODIUM PHOSPHATE 10 MG/ML IJ SOLN
INTRAMUSCULAR | Status: AC
  Filled 2015-08-08: qty 1

## 2015-08-08 MED ORDER — LIDOCAINE HCL 2 % EX GEL
CUTANEOUS | Status: AC
  Filled 2015-08-08: qty 5

## 2015-08-08 MED ORDER — TAMSULOSIN HCL 0.4 MG PO CAPS: 0.4000 mg | ORAL_CAPSULE | Freq: Every day | ORAL | Status: DC

## 2015-08-08 SURGICAL SUPPLY — 35 items
BAG URO CATCHER STRL LF (MISCELLANEOUS) ×4 IMPLANT
BASKET DAKOTA 1.9FR 11X120 (BASKET) IMPLANT
BASKET LASER NITINOL 1.9FR (BASKET) IMPLANT
BASKET STONE 1.7 NGAGE (UROLOGICAL SUPPLIES) ×4 IMPLANT
BASKET ZERO TIP NITINOL 2.4FR (BASKET) IMPLANT
BSKT STON RTRVL 120 1.9FR (BASKET)
BSKT STON RTRVL ZERO TP 2.4FR (BASKET)
CANISTER SUCT LVC 12 LTR MEDI- (MISCELLANEOUS) IMPLANT
CATH FOLEY 2WAY  3CC  8FR (CATHETERS) ×2
CATH FOLEY 2WAY 3CC 8FR (CATHETERS) IMPLANT
CATH INTERMIT  6FR 70CM (CATHETERS) IMPLANT
CLOTH BEACON ORANGE TIMEOUT ST (SAFETY) ×4 IMPLANT
FIBER LASER FLEXIVA 365 (UROLOGICAL SUPPLIES) IMPLANT
FIBER LASER TRAC TIP (UROLOGICAL SUPPLIES) ×2 IMPLANT
GLOVE BIO SURGEON STRL SZ8 (GLOVE) ×4 IMPLANT
GLOVE SURG SS PI 7.5 STRL IVOR (GLOVE) ×4 IMPLANT
GOWN STRL REUS W/ TWL LRG LVL3 (GOWN DISPOSABLE) ×2 IMPLANT
GOWN STRL REUS W/ TWL XL LVL3 (GOWN DISPOSABLE) ×2 IMPLANT
GOWN STRL REUS W/TWL LRG LVL3 (GOWN DISPOSABLE)
GOWN STRL REUS W/TWL XL LVL3 (GOWN DISPOSABLE) ×6 IMPLANT
GUIDEWIRE ANG ZIPWIRE 038X150 (WIRE) ×4 IMPLANT
GUIDEWIRE STR DUAL SENSOR (WIRE) ×2 IMPLANT
IV NS 1000ML (IV SOLUTION) ×4
IV NS 1000ML BAXH (IV SOLUTION) IMPLANT
IV NS IRRIG 3000ML ARTHROMATIC (IV SOLUTION) ×4 IMPLANT
KIT ROOM TURNOVER WOR (KITS) ×4 IMPLANT
MANIFOLD NEPTUNE II (INSTRUMENTS) IMPLANT
PACK CYSTO (CUSTOM PROCEDURE TRAY) ×4 IMPLANT
SHEATH ACCESS URETERAL 38CM (SHEATH) ×2 IMPLANT
STENT URET 6FRX26 CONTOUR (STENTS) ×2 IMPLANT
SYRINGE 10CC LL (SYRINGE) ×4 IMPLANT
TUBE CONNECTING 12'X1/4 (SUCTIONS) ×1
TUBE CONNECTING 12X1/4 (SUCTIONS) ×1 IMPLANT
TUBE FEEDING 8FR 16IN STR KANG (MISCELLANEOUS) IMPLANT
WATER STERILE IRR 500ML POUR (IV SOLUTION) ×2 IMPLANT

## 2015-08-08 NOTE — Anesthesia Procedure Notes (Signed)
Procedure Name: LMA Insertion Date/Time: 08/08/2015 3:56 PM Performed by: Bethena Roys T Pre-anesthesia Checklist: Patient identified, Emergency Drugs available, Suction available and Patient being monitored Patient Re-evaluated:Patient Re-evaluated prior to inductionOxygen Delivery Method: Circle System Utilized Preoxygenation: Pre-oxygenation with 100% oxygen Intubation Type: IV induction Ventilation: Mask ventilation without difficulty LMA: LMA with gastric port inserted LMA Size: 5.0 Number of attempts: 1 Placement Confirmation: positive ETCO2 Tube secured with: Tape Dental Injury: Teeth and Oropharynx as per pre-operative assessment

## 2015-08-08 NOTE — H&P (View-Only) (Signed)
Urology Admission H&P  Chief Complaint: left flank pain  History of Present Illness: Ms Melesio is a 55yo with a hx of nephrolithiasis here for left ureteroscopic stone extraction. She underwent ESWL and has not passed all of the fragemnts. 2 days ago she presented to the ER with severe left flank pain. She was given toradol and narcotics and was then discharged. Today she has mild left flank pain  Past Medical History  Diagnosis Date  . Nephrolithiasis   . Cholelithiasis   . Dyspnea on exertion     lower extremity edema  . Hypertension 2006    onset in 2006; controlled with single drug  . GERD (gastroesophageal reflux disease)   . Headache   . Arthritis    Past Surgical History  Procedure Laterality Date  . Cesarean section    . Tubal ligation    . Tonsillectomy    . Colonoscopy with esophagogastroduodenoscopy (egd)  04/25/2012    Procedure: COLONOSCOPY WITH ESOPHAGOGASTRODUODENOSCOPY (EGD);  Surgeon: Rogene Houston, MD;  Location: [facility];  Service: Endoscopy;  Laterality: N/A;  730  . Maloney dilation  04/25/2012    Procedure: MALONEY DILATION;  Surgeon: Rogene Houston, MD;  Location: [facility];  Service: Endoscopy;  Laterality: N/A;  . Dilation and curettage of uterus    . Cholecystectomy N/A 07/31/2013    Procedure: LAPAROSCOPIC CHOLECYSTECTOMY;  Surgeon: Jamesetta So, MD;  Location: [facility];  Service: General;  Laterality: N/A;  . Liver biopsy N/A 07/31/2013    Procedure: LIVER BIOPSY;  Surgeon: Jamesetta So, MD;  Location: [facility];  Service: General;  Laterality: N/A;    Home Medications:  Prescriptions prior to admission  Medication Sig Dispense Refill Last Dose  . acetaminophen (TYLENOL) 500 MG tablet Take 1,000 mg by mouth every 6 (six) hours as needed. For pain/fever    07/23/2015  . BIOTIN PO Take 1 tablet by mouth daily.   07/21/2015  . furosemide (LASIX) 20 MG tablet Take 1 tablet (20 mg total) by mouth daily. (Patient taking differently: Take 20 mg by  mouth every other day. Takes on days she doesn't take the torsemide.) 90 tablet 4 07/24/2015 at am  . ibuprofen (ADVIL,MOTRIN) 200 MG tablet Take 200 mg by mouth every 6 (six) hours as needed for headache or moderate pain.   Past Month at Unknown time  . loratadine (CLARITIN) 10 MG tablet Take 10 mg by mouth daily.   07/19/2015  . oxyCODONE-acetaminophen (PERCOCET/ROXICET) 5-325 MG tablet Take 0.5-1 tablets by mouth every 4 (four) hours as needed for moderate pain or severe pain.   07/24/2015 at 2200  . pantoprazole (PROTONIX) 40 MG tablet Take 1 tablet (40 mg total) by mouth daily. 90 tablet 3 07/19/2015  . phenazopyridine (PYRIDIUM) 100 MG tablet Take 100 mg by mouth 3 (three) times daily as needed for pain.   07/24/2015 at 2200  . raloxifene (EVISTA) 60 MG tablet Take 60 mg by mouth daily.   07/21/2015  . tamsulosin (FLOMAX) 0.4 MG CAPS capsule Take 0.4 mg by mouth daily.   07/24/2015 at am  . torsemide (DEMADEX) 20 MG tablet Take 10 tablets by mouth 3 (three) times a week.  3 07/19/2015  . valsartan-hydrochlorothiazide (DIOVAN-HCT) 160-25 MG tablet Take 1 tablet by mouth every morning.  4 07/24/2015 at am  . vitamin E 1000 UNIT capsule Take 1,000 Units by mouth daily.   07/21/2015  . cephALEXin (KEFLEX) 500 MG capsule Take 1 capsule (500 mg total)  by mouth 2 (two) times daily. 20 capsule 0 07/24/2015 at 1930   Allergies:  Allergies  Allergen Reactions  . Bee Venom Swelling    Family History  Problem Relation Age of Onset  . Stroke Father   . Heart attack Father   . Alzheimer's disease Mother   . Diabetes Brother   . Alzheimer's disease Sister   . Colon cancer Paternal Uncle    Social History:  reports that she quit smoking about 34 years ago. Her smoking use included Cigarettes. She has a 2 pack-year smoking history. She has never used smokeless tobacco. She reports that she drinks alcohol. She reports that she does not use illicit drugs.  Review of Systems  Genitourinary: Positive for flank  pain.  All other systems reviewed and are negative.   Physical Exam:  Vital signs in last 24 hours: Temp:  [97.8 F (36.6 C)] 97.8 F (36.6 C) (02/20 1152) Pulse Rate:  [77] 77 (02/20 1152) Resp:  [18] 18 (02/20 1152) BP: (103)/(68) 103/68 mmHg (02/20 1152) SpO2:  [99 %] 99 % (02/20 1152) Weight:  [122.471 kg (270 lb)] 122.471 kg (270 lb) (02/20 1244) Physical Exam  Constitutional: She is oriented to person, place, and time. She appears well-developed and well-nourished.  HENT:  Head: Normocephalic and atraumatic.  Eyes: EOM are normal. Pupils are equal, round, and reactive to light.  Neck: Normal range of motion. No thyromegaly present.  Cardiovascular: Normal rate and regular rhythm.   Respiratory: Effort normal. No respiratory distress.  GI: Soft. She exhibits no distension and no mass. There is no tenderness. There is no rebound and no guarding.  Musculoskeletal: Normal range of motion.  Neurological: She is alert and oriented to person, place, and time.  Skin: Skin is warm and dry.  Psychiatric: She has a normal mood and affect. Her behavior is normal. Judgment and thought content normal.    Laboratory Data:  No results found for this or any previous visit (from the past 24 hour(s)). No results found for this or any previous visit (from the past 240 hour(s)). Creatinine:  Recent Labs  07/23/15 2017  CREATININE 1.39*   Baseline Creatinine: unknown  Impression/Assessment:  54yo with a left ureteral calculus  Plan:  The risks/benefits/alternatives to left ureteroscopic stone extraction was explained to the patient and she understands and wishes to proceed with surgery.  Greycen Felter L 07/25/2015, 1:02 PM

## 2015-08-08 NOTE — Anesthesia Postprocedure Evaluation (Signed)
Anesthesia Post Note  Patient: Marilyn Martin  Procedure(s) Performed: Procedure(s) (LRB): CYSTOSCOPY LEFT RETROGRADE/URETEROSCOPY/STONE EXTRACTION WITH BASKET (Left) LEFT STENT REPLACEMENT (Left) HOLMIUM LASER APPLICATION (Left)  Patient location during evaluation: PACU Anesthesia Type: General Level of consciousness: awake Pain management: pain level controlled Vital Signs Assessment: post-procedure vital signs reviewed and stable Respiratory status: spontaneous breathing Cardiovascular status: stable Postop Assessment: no signs of nausea or vomiting Anesthetic complications: no    Last Vitals:  Filed Vitals:   08/08/15 1715 08/08/15 1730  BP: 107/62 100/47  Pulse: 79 76  Temp:    Resp: 20 19    Last Pain:  Filed Vitals:   08/08/15 1732  PainSc: 3                  Rock Sobol

## 2015-08-08 NOTE — Brief Op Note (Signed)
08/08/2015  4:57 PM  PATIENT:  Marilyn Martin  55 y.o. female  PRE-OPERATIVE DIAGNOSIS:  LEFT URETERAL STONE  POST-OPERATIVE DIAGNOSIS:  LEFT URETERAL STONE  PROCEDURE:  Procedure(s): CYSTOSCOPY LEFT RETROGRADE/URETEROSCOPY/STONE EXTRACTION WITH BASKET (Left) LEFT STENT REPLACEMENT (Left) HOLMIUM LASER APPLICATION (Left)  SURGEON:  Surgeon(s) and Role:    * Cleon Gustin, MD - Primary  PHYSICIAN ASSISTANT:   ASSISTANTS: none   ANESTHESIA:   general  EBL:  Total I/O In: 1000 [I.V.:1000] Out: 10 [Blood:10]  BLOOD ADMINISTERED:none  DRAINS: left 6x26 JJ ureteral stent without tether  LOCAL MEDICATIONS USED:  NONE  SPECIMEN:  Source of Specimen:  stone  DISPOSITION OF SPECIMEN:  N/A  COUNTS:  YES  TOURNIQUET:  * No tourniquets in log *  DICTATION: .Note written in EPIC  PLAN OF CARE: Discharge to home after PACU  PATIENT DISPOSITION:  PACU - hemodynamically stable.   Delay start of Pharmacological VTE agent (>24hrs) due to surgical blood loss or risk of bleeding: not applicable

## 2015-08-08 NOTE — Transfer of Care (Signed)
Immediate Anesthesia Transfer of Care Note  Patient: Marilyn Martin  Procedure(s) Performed: Procedure(s): CYSTOSCOPY LEFT RETROGRADE/URETEROSCOPY/STONE EXTRACTION WITH BASKET (Left) LEFT STENT REPLACEMENT (Left) HOLMIUM LASER APPLICATION (Left)  Patient Location: PACU  Anesthesia Type:General  Level of Consciousness: awake, alert  and oriented  Airway & Oxygen Therapy: Patient Spontanous Breathing and Patient connected to nasal cannula oxygen  Post-op Assessment: Report given to RN  Post vital signs: Reviewed and stable  Last Vitals:108/64,  84, 16, 100% Filed Vitals:   08/08/15 1227  BP: 94/61  Pulse: 100  Temp: 36.8 C  Resp: 22    Complications: No apparent anesthesia complications

## 2015-08-08 NOTE — Anesthesia Preprocedure Evaluation (Signed)
Anesthesia Evaluation  Patient identified by MRN, date of birth, ID band Patient awake    Reviewed: Allergy & Precautions, NPO status , Patient's Chart, lab work & pertinent test results  Airway Mallampati: III  TM Distance: >3 FB Neck ROM: Full    Dental  (+) Teeth Intact   Pulmonary shortness of breath and with exertion, former smoker,    breath sounds clear to auscultation       Cardiovascular hypertension, Pt. on medications  Rhythm:Regular     Neuro/Psych  Headaches,    GI/Hepatic Neg liver ROS, GERD  Medicated and Controlled,  Endo/Other  Morbid obesity  Renal/GU Renal InsufficiencyRenal disease     Musculoskeletal  (+) Arthritis ,   Abdominal   Peds  Hematology negative hematology ROS (+)   Anesthesia Other Findings   Reproductive/Obstetrics                             Anesthesia Physical  Anesthesia Plan  ASA: II  Anesthesia Plan: General   Post-op Pain Management:    Induction: Intravenous  Airway Management Planned: LMA  Additional Equipment: None  Intra-op Plan:   Post-operative Plan: Extubation in OR  Informed Consent: I have reviewed the patients History and Physical, chart, labs and discussed the procedure including the risks, benefits and alternatives for the proposed anesthesia with the patient or authorized representative who has indicated his/her understanding and acceptance.   Dental advisory given  Plan Discussed with: CRNA and Surgeon  Anesthesia Plan Comments:         Anesthesia Quick Evaluation

## 2015-08-08 NOTE — Discharge Instructions (Signed)
Ureteroscopy, Care After Refer to this sheet in the next few weeks. These instructions provide you with information on caring for yourself after your procedure. Your health care provider may also give you more specific instructions. Your treatment has been planned according to current medical practices, but problems sometimes occur. Call your health care provider if you have any problems or questions after your procedure.  WHAT TO EXPECT AFTER THE PROCEDURE  After your procedure, it is typical to have the following:   A burning sensation when you urinate.  Blood in your urine. HOME CARE INSTRUCTIONS   Only take medicines as directed by your health care provider. Do not take any over-the-counter pain medication unless your health care provider says it is okay.  Take a warm bath or hold a warm washcloth over your groin to relieve burning.  Drink enough fluids to keep your urine clear or pale yellow.  Drink two 8-ounce glasses of water every hour for the first 2 hours after you get home.  Continue to drink water often at home.  You can eat what you usually do.  Ask your surgeon when you can do your usual activities.  If you had a tube placed to keep urine flowing (ureteral stent), ask your health care provider when you need to return to have it removed.  Keep all follow-up appointments. SEEK MEDICAL CARE IF:   You have chills or fever.  You have burning pain for longer than 24 hours after the procedure.  You have blood in your urine for longer than 24 hours after the procedure. SEEK IMMEDIATE MEDICAL CARE IF:   You have large amounts of blood or clots in your urine.  You have very bad pain.  You have chest pain or trouble breathing.   This information is not intended to replace advice given to you by your health care provider. Make sure you discuss any questions you have with your health care provider.   Document Released: 05/26/2013 Document Reviewed: 05/26/2013 Elsevier  Interactive Patient Education 2016 [place] [facility] Home Care Instructions  Activity: Get plenty of rest for the remainder of the day. A responsible adult should stay with you for 24 hours following the procedure.  For the next 24 hours, DO NOT: -Drive a car -Paediatric nurse -Drink alcoholic beverages -Take any medication unless instructed by your physician -Make any legal decisions or sign important papers.  Meals: Start with liquid foods such as gelatin or soup. Progress to regular foods as tolerated. Avoid greasy, spicy, heavy foods. If nausea and/or vomiting occur, drink only clear liquids until the nausea and/or vomiting subsides. Call your physician if vomiting continues.  Special Instructions/Symptoms: Your throat may feel dry or sore from the anesthesia or the breathing tube placed in your throat during surgery. If this causes discomfort, gargle with warm salt water. The discomfort should disappear within 24 hours.  If you had a scopolamine patch placed behind your ear for the management of post- operative nausea and/or vomiting:  1. The medication in the patch is effective for 72 hours, after which it should be removed.  Wrap patch in a tissue and discard in the trash. Wash hands thoroughly with soap and water. 2. You may remove the patch earlier than 72 hours if you experience unpleasant side effects which may include dry mouth, dizziness or visual disturbances. 3. Avoid touching the patch. Wash your hands with soap and water after contact with the patch.

## 2015-08-08 NOTE — Interval H&P Note (Signed)
History and Physical Interval Note:  08/08/2015 3:46 PM  Marilyn Martin  has presented today for surgery, with the diagnosis of LEFT URETERAL STONE  The various methods of treatment have been discussed with the patient and family. After consideration of risks, benefits and other options for treatment, the patient has consented to  Procedure(s): CYSTOSCOPY LEFT RETROGRADE/URETEROSCOPY/STONE EXTRACTION WITH BASKET (Left) LEFT STENT REPLACEMENT (Left) HOLMIUM LASER APPLICATION (Left) as a surgical intervention .  The patient's history has been reviewed, patient examined, no change in status, stable for surgery.  I have reviewed the patient's chart and labs.  Questions were answered to the patient's satisfaction.     Ikaika Showers L

## 2015-08-09 ENCOUNTER — Encounter (HOSPITAL_BASED_OUTPATIENT_CLINIC_OR_DEPARTMENT_OTHER): Payer: Self-pay | Admitting: Urology

## 2015-08-10 NOTE — Op Note (Signed)
Preoperative diagnosis: Left renal stone  Postoperative diagnosis: Same  Procedure: 1 cystoscopy 2. Left retrograde pyelography 3.  Intraoperative fluoroscopy, under one hour, with interpretation 4.  Left ureteroscopic stone manipulation with laser lithotripsy 5.  Left 6 x 26 JJ stent exchange  Attending: Rosie Fate  Anesthesia: General  Estimated blood loss: None  Drains: Left 6 x 26 JJ ureteral stent without tether  Specimens: stone  Antibiotics: rocephin  Findings: left impacted UPJ stone with multiple smaller stone in the lower pole. No hydronephrosis. No masses/lesions in the bladder. Ureteral orifices in normal anatomic location.  Indications: Patient is a 55 year old female/female with a history of left renal stone and who has persistent left flank pain.  After discussing treatment options, she decided proceed with left ureteroscopic stone manipulation.  Procedure her in detail: The patient was brought to the operating room and a brief timeout was done to ensure correct patient, correct procedure, correct site.  General anesthesia was administered patient was placed in dorsal lithotomy position. Their genitalia was then prepped and draped in usual sterile fashion.  A rigid 20 French cystoscope was passed in the urethra and the bladder.  Bladder was inspected free masses or lesions.  the ureteral orifices were in the normal orthotopic locations. Using a grasper the left ureteral stent was brought to the urethral meatus. A zipwire was advanced through the stent and up to the renal pelvis and the stent was removed. a 6 french ureteral catheter was then instilled into the left ureteral orifice.  a gentle retrograde was obtained and findings noted above.   we then removed the cystoscope and cannulated the left ureteral orifice with a semirigid ureteroscope.  We encountered multiple proximal ureteral calculi and 1 fragment embedded into the wall of the ureter. Using a 200nm laser  fiber the stone was fragmented and the fragments were removed with an NGage basket. Once we reached the UPJ a sensor wire was advanced in to the renal pelvis. We then removed the ureteroscope and advanced a 12/14 x 38cm access sheath up to the renal pelvic. We used the flexible ureteroscope to perform nephroscopy. We encountered the stone in the lower pole. Using a 200 nm laser fiber and fragmented the stone into smaller pieces.  the pieces were then removed with a Ngage basket. once all stone fragments were removed we then removed the access sheath under direct vision and noted no injury to the ureter. We then placed a 6 x 26 double-j ureteral stent over the original zip wire. We removed the wire and good coil was noted in the the renal pelvis under fluoroscopy and the bladder under direct vision.     the stone fragments were then removed from the bladder and sent for analysis.   the bladder was then drained and this concluded the procedure which was well tolerated by patient.  Complications: None  Condition: Stable, extubated, transferred to PACU  Plan: Patient is to be discharged home as to follow-up in 2 weeks for stent removal.

## 2015-08-24 ENCOUNTER — Ambulatory Visit (INDEPENDENT_AMBULATORY_CARE_PROVIDER_SITE_OTHER): Payer: 59 | Admitting: Urology

## 2015-08-24 ENCOUNTER — Other Ambulatory Visit: Payer: Self-pay | Admitting: Urology

## 2015-08-24 DIAGNOSIS — N201 Calculus of ureter: Secondary | ICD-10-CM

## 2015-09-05 MED FILL — VALSARTAN-HCTZ 160-25 MG TA: 160-25 | 90 days supply | Qty: 90 | Fill #2

## 2015-09-13 ENCOUNTER — Telehealth (INDEPENDENT_AMBULATORY_CARE_PROVIDER_SITE_OTHER): Payer: Self-pay | Admitting: Internal Medicine

## 2015-09-13 DIAGNOSIS — M545 Low back pain, unspecified: Secondary | ICD-10-CM

## 2015-09-13 MED ORDER — DICLOFENAC SODIUM 1 % TD GEL: 2.0000 g | Freq: Four times a day (QID) | TRANSDERMAL | Status: DC

## 2015-09-13 MED FILL — DICLOFENAC SODIUM 1% GEL: 1 | 13 days supply | Qty: 100 | Fill #0

## 2015-09-13 NOTE — Telephone Encounter (Signed)
Rx sent to [facility]

## 2015-09-19 ENCOUNTER — Ambulatory Visit (HOSPITAL_COMMUNITY)
Admission: RE | Admit: 2015-09-19 | Discharge: 2015-09-19 | Disposition: A | Discharge status: Home / Self Care | Payer: 59 | Source: Ambulatory Visit | Attending: Urology | Admitting: Urology

## 2015-09-19 DIAGNOSIS — N201 Calculus of ureter: Secondary | ICD-10-CM | POA: Insufficient documentation

## 2015-09-22 MED FILL — TORSEMIDE 20 MG TABLET: 20 | 84 days supply | Qty: 36 | Fill #2

## 2015-09-28 ENCOUNTER — Ambulatory Visit (INDEPENDENT_AMBULATORY_CARE_PROVIDER_SITE_OTHER): Payer: 59 | Admitting: Urology

## 2015-09-28 DIAGNOSIS — N200 Calculus of kidney: Secondary | ICD-10-CM | POA: Diagnosis not present

## 2015-09-28 DIAGNOSIS — N393 Stress incontinence (female) (male): Secondary | ICD-10-CM

## 2015-10-18 ENCOUNTER — Telehealth (INDEPENDENT_AMBULATORY_CARE_PROVIDER_SITE_OTHER): Payer: Self-pay | Admitting: Internal Medicine

## 2015-10-26 DIAGNOSIS — I872 Venous insufficiency (chronic) (peripheral): Secondary | ICD-10-CM | POA: Diagnosis not present

## 2015-10-26 DIAGNOSIS — L280 Lichen simplex chronicus: Secondary | ICD-10-CM | POA: Diagnosis not present

## 2015-11-08 MED FILL — KLOR-CON M10 TABLET: 10 | 84 days supply | Qty: 36 | Fill #1

## 2015-11-08 MED FILL — FUROSEMIDE 20 MG TABLET: 20 | 90 days supply | Qty: 90 | Fill #1

## 2015-11-25 ENCOUNTER — Ambulatory Visit (INDEPENDENT_AMBULATORY_CARE_PROVIDER_SITE_OTHER): Payer: 59

## 2015-11-25 ENCOUNTER — Ambulatory Visit (INDEPENDENT_AMBULATORY_CARE_PROVIDER_SITE_OTHER): Payer: 59 | Admitting: Podiatry

## 2015-11-25 DIAGNOSIS — D239 Other benign neoplasm of skin, unspecified: Secondary | ICD-10-CM | POA: Diagnosis not present

## 2015-11-25 DIAGNOSIS — M7661 Achilles tendinitis, right leg: Secondary | ICD-10-CM | POA: Diagnosis not present

## 2015-11-25 DIAGNOSIS — M722 Plantar fascial fibromatosis: Secondary | ICD-10-CM

## 2015-11-25 DIAGNOSIS — M79673 Pain in unspecified foot: Secondary | ICD-10-CM

## 2015-11-25 DIAGNOSIS — D1800 Hemangioma unspecified site: Secondary | ICD-10-CM | POA: Diagnosis not present

## 2015-11-25 MED ORDER — TRIAMCINOLONE ACETONIDE 10 MG/ML IJ SUSP
10.0000 mg | Freq: Once | INTRAMUSCULAR | Status: AC
  Administered 2015-11-25: 10 mg

## 2015-11-25 MED ORDER — DICLOFENAC SODIUM 75 MG PO TBEC: 75.0000 mg | DELAYED_RELEASE_TABLET | Freq: Two times a day (BID) | ORAL | Status: DC

## 2015-11-25 NOTE — Patient Instructions (Signed)

## 2015-11-25 NOTE — Progress Notes (Signed)
Subjective:    Patient ID: Marilyn Martin, female    DOB: [birth year], 55 y.o.   MRN: [id number]  HPI    Review of Systems  Respiratory: Positive for shortness of breath.   Musculoskeletal: Positive for gait problem.  Neurological: Positive for headaches.  All other systems reviewed and are negative.      Objective:   Physical Exam        Assessment & Plan:

## 2015-11-27 NOTE — Progress Notes (Signed)
Subjective:     Patient ID: Marilyn Martin, female   DOB: [birth year], 55 y.o.   MRN: [id number]  HPI patient presents with quite a bit of pain in the outside of the right over left Achilles tendon at its insertion into the calcaneus. States it's been several months and it makes it hard to wear shoe gear comfortably   Review of Systems  All other systems reviewed and are negative.      Objective:   Physical Exam  Constitutional: She is oriented to person, place, and time.  Cardiovascular: Intact distal pulses.   Musculoskeletal: Normal range of motion.  Neurological: She is oriented to person, place, and time.  Skin: Skin is warm.  Nursing note and vitals reviewed.  neurovascular status intact muscle strength adequate range of motion within normal limits with patient found to have pain in the posterior lateral aspect of the Achilles that's very tender when pressed. The tendon itself is functioning well and minimal discomfort in the central or medial side of the tendon. Patient's found to have good digital perfusion and is well oriented 3     Assessment:     Achilles tendinitis right lateral side with central and medial band doing well    Plan:     H&P and x-ray reviewed. I discussed injection and risk of rupture and she is willing to accept risk and at this time I did a careful lateral injection 3 mg dexamethasone Kenalog 5 mg Xylocaine being careful not to put it directly into the tendon. I then advised on reduced activity and no stretching and patient will be seen back in 3 weeks and may need other treatments depending on response. Difficult to do immobilization because of other health issues at this time  Dictated spurring of the posterior heel right and left with no indications of stress fracture arthritis

## 2015-11-28 ENCOUNTER — Other Ambulatory Visit: Payer: Self-pay | Admitting: Urology

## 2015-11-28 DIAGNOSIS — N200 Calculus of kidney: Secondary | ICD-10-CM

## 2015-12-09 DIAGNOSIS — Z1231 Encounter for screening mammogram for malignant neoplasm of breast: Secondary | ICD-10-CM | POA: Diagnosis not present

## 2015-12-09 DIAGNOSIS — Z6841 Body Mass Index (BMI) 40.0 and over, adult: Secondary | ICD-10-CM | POA: Diagnosis not present

## 2015-12-09 DIAGNOSIS — Z01419 Encounter for gynecological examination (general) (routine) without abnormal findings: Secondary | ICD-10-CM | POA: Diagnosis not present

## 2015-12-12 ENCOUNTER — Ambulatory Visit (HOSPITAL_COMMUNITY)
Admission: RE | Admit: 2015-12-12 | Discharge: 2015-12-12 | Disposition: A | Discharge status: Home / Self Care | Payer: 59 | Source: Ambulatory Visit | Attending: Urology | Admitting: Urology

## 2015-12-12 DIAGNOSIS — Z09 Encounter for follow-up examination after completed treatment for conditions other than malignant neoplasm: Secondary | ICD-10-CM | POA: Insufficient documentation

## 2015-12-12 DIAGNOSIS — N200 Calculus of kidney: Secondary | ICD-10-CM | POA: Diagnosis not present

## 2015-12-12 DIAGNOSIS — Z87442 Personal history of urinary calculi: Secondary | ICD-10-CM | POA: Diagnosis not present

## 2015-12-22 MED FILL — TORSEMIDE 20 MG TABLET: 20 | 90 days supply | Qty: 40 | Fill #0

## 2015-12-23 ENCOUNTER — Encounter: Payer: Self-pay | Admitting: Podiatry

## 2015-12-23 ENCOUNTER — Ambulatory Visit (INDEPENDENT_AMBULATORY_CARE_PROVIDER_SITE_OTHER): Payer: 59 | Admitting: Podiatry

## 2015-12-23 DIAGNOSIS — M7661 Achilles tendinitis, right leg: Secondary | ICD-10-CM | POA: Diagnosis not present

## 2015-12-23 MED ORDER — TRIAMCINOLONE ACETONIDE 10 MG/ML IJ SUSP
10.0000 mg | Freq: Once | INTRAMUSCULAR | Status: AC
  Administered 2015-12-23: 10 mg

## 2015-12-27 NOTE — Telephone Encounter (Signed)
error 

## 2015-12-28 ENCOUNTER — Ambulatory Visit (INDEPENDENT_AMBULATORY_CARE_PROVIDER_SITE_OTHER): Payer: 59 | Admitting: Urology

## 2015-12-28 DIAGNOSIS — N200 Calculus of kidney: Secondary | ICD-10-CM

## 2015-12-28 NOTE — Progress Notes (Signed)
Subjective:     Patient ID: Marilyn Martin, female   DOB: 22-Sep-1960, 55 y.o.   MRN: [id number]  HPI patient states my right one is doing pretty well but my left one is bothering me and I like to get that better like my right one   Review of Systems     Objective:   Physical Exam Neurovascular status intact with patient found to    pain in the lateral side of the left Achilles with the right doing much better after previous injection therapy Assessment:     Right one improved with utilization medication with patient having discomfort and pain of the left posterior lateral Achilles tendon    Plan:     Condition reviewed and recommended careful injection left explaining chances for rupture. She wants procedure and today I did a careful lateral injection not directly into the tendon with 3 mg dexamethasone Kenalog 5 mg Xylocaine into the posterior lateral Achilles tendon and advised on reduced activity

## 2015-12-29 ENCOUNTER — Other Ambulatory Visit (HOSPITAL_COMMUNITY): Payer: Self-pay | Admitting: Surgery

## 2015-12-30 DIAGNOSIS — Z1211 Encounter for screening for malignant neoplasm of colon: Secondary | ICD-10-CM | POA: Diagnosis not present

## 2015-12-30 DIAGNOSIS — Z6841 Body Mass Index (BMI) 40.0 and over, adult: Secondary | ICD-10-CM | POA: Diagnosis not present

## 2015-12-30 DIAGNOSIS — I10 Essential (primary) hypertension: Secondary | ICD-10-CM | POA: Diagnosis not present

## 2015-12-30 DIAGNOSIS — Z299 Encounter for prophylactic measures, unspecified: Secondary | ICD-10-CM | POA: Diagnosis not present

## 2015-12-30 DIAGNOSIS — F419 Anxiety disorder, unspecified: Secondary | ICD-10-CM | POA: Diagnosis not present

## 2015-12-30 DIAGNOSIS — Z0000 Encounter for general adult medical examination without abnormal findings: Secondary | ICD-10-CM | POA: Diagnosis not present

## 2015-12-30 DIAGNOSIS — Z79899 Other long term (current) drug therapy: Secondary | ICD-10-CM | POA: Diagnosis not present

## 2016-01-10 MED FILL — RALOXIFENE HCL 60 MG TABLET: 60 | 90 days supply | Qty: 90 | Fill #0

## 2016-01-10 MED FILL — VALSARTAN-HCTZ 160-25 MG TA: 160-25 | 90 days supply | Qty: 90 | Fill #0

## 2016-01-13 ENCOUNTER — Ambulatory Visit (HOSPITAL_COMMUNITY)
Admission: RE | Admit: 2016-01-13 | Discharge: 2016-01-13 | Disposition: A | Discharge status: Home / Self Care | Payer: 59 | Source: Ambulatory Visit | Attending: Surgery | Admitting: Surgery

## 2016-01-13 DIAGNOSIS — Z01818 Encounter for other preprocedural examination: Secondary | ICD-10-CM | POA: Diagnosis not present

## 2016-01-26 DIAGNOSIS — K219 Gastro-esophageal reflux disease without esophagitis: Secondary | ICD-10-CM | POA: Diagnosis not present

## 2016-01-26 DIAGNOSIS — I10 Essential (primary) hypertension: Secondary | ICD-10-CM | POA: Diagnosis not present

## 2016-01-27 ENCOUNTER — Ambulatory Visit (INDEPENDENT_AMBULATORY_CARE_PROVIDER_SITE_OTHER): Payer: 59 | Admitting: Podiatry

## 2016-01-27 ENCOUNTER — Encounter: Payer: Self-pay | Admitting: Skilled Nursing Facility1

## 2016-01-27 ENCOUNTER — Encounter: Payer: Self-pay | Admitting: Podiatry

## 2016-01-27 ENCOUNTER — Encounter: Payer: 59 | Attending: Surgery | Admitting: Skilled Nursing Facility1

## 2016-01-27 DIAGNOSIS — E669 Obesity, unspecified: Secondary | ICD-10-CM

## 2016-01-27 DIAGNOSIS — Z713 Dietary counseling and surveillance: Secondary | ICD-10-CM | POA: Insufficient documentation

## 2016-01-27 DIAGNOSIS — M7662 Achilles tendinitis, left leg: Secondary | ICD-10-CM

## 2016-01-27 DIAGNOSIS — M7661 Achilles tendinitis, right leg: Secondary | ICD-10-CM | POA: Diagnosis not present

## 2016-01-27 MED ORDER — DICLOFENAC SODIUM 75 MG PO TBEC: 75.0000 mg | DELAYED_RELEASE_TABLET | Freq: Two times a day (BID) | ORAL | 2 refills | Status: DC

## 2016-01-27 MED ORDER — TRIAMCINOLONE ACETONIDE 10 MG/ML IJ SUSP
10.0000 mg | Freq: Once | INTRAMUSCULAR | Status: AC
  Administered 2016-01-27: 10 mg

## 2016-01-27 NOTE — Progress Notes (Signed)
  Pre-Op Assessment Visit:  Pre-Operative Sleeve Gastrectomy Surgery  Medical Nutrition Therapy:  Appt start time: 1000   End time:  1100.  Patient was seen on 01/27/2016 for Pre-Operative Nutrition Assessment. Assessment and letter of approval faxed to [facility] Bariatric Surgery Program coordinator on 01/27/2016.  Pt states she is currently on wt watchers.  Preferred Learning Style:   No preference indicated  Learning Readiness:   Ready  Handouts given during visit include:  Pre-Op Goals Bariatric Surgery Protein Shakes  During the appointment today the following Pre-Op Goals were reviewed with the patient: Maintain or lose weight as instructed by your surgeon Make healthy food choices Begin to limit portion sizes Limited concentrated sugars and fried foods Keep fat/sugar in the single digits per serving on   food labels Practice CHEWING your food  (aim for 30 chews per bite or until applesauce consistency) Practice not drinking 15 minutes before, during, and 30 minutes after each meal/snack Avoid all carbonated beverages  Avoid/limit caffeinated beverages  Avoid all sugar-sweetened beverages Consume 3 meals per day; eat every 3-5 hours Make a list of non-food related activities Aim for 64-100 ounces of FLUID daily  Aim for at least 60-80 grams of PROTEIN daily Look for a liquid protein source that contain ?15 g protein and ?5 g carbohydrate  (ex: shakes, drinks, shots)  Patient-Centered Goals: 10/10 specific/non-scale and confidence/importance scale 1-10  Demonstrated degree of understanding via:  Teach Back  Teaching Method Utilized:  Visual Auditory Hands on  Barriers to learning/adherence to lifestyle change: none identified  [facility] to [facility] the [facility] to enroll in Pre-Op and Post-Op Nutrition Education when surgery date is scheduled.

## 2016-01-30 NOTE — Progress Notes (Signed)
Subjective:     Patient ID: Marilyn Martin, female   DOB: April 30, 1961, 55 y.o.   MRN: [id number]  HPI patient states my left heel has started to hurt again but it seems like it's on the other side   Review of Systems     Objective:   Physical Exam Neurovascular status intact negative Homans sign was noted with well healing site on the left lateral side with inflammation pain on the medial side of the Achilles tendon with pain after periods of sitting or in the morning    Assessment:     Achilles tendinitis which is moved more to the medial side of the Achilles from lateral side    Plan:     I reviewed condition and at this point I have recommended we did careful medial injection after reviewing with her the possibilities for tear and other situations that can occur with this. Patient wants this done and I carefully injected the medial side 3 mg Dexon some Kenalog 5 mg Xylocaine and dispensed a night splint with all instructions on usage and the fact she needs to take it easy on this. Reappoint in several weeks to reevaluate

## 2016-01-31 ENCOUNTER — Encounter: Payer: Self-pay | Admitting: Skilled Nursing Facility1

## 2016-03-09 ENCOUNTER — Other Ambulatory Visit (HOSPITAL_BASED_OUTPATIENT_CLINIC_OR_DEPARTMENT_OTHER): Payer: Self-pay

## 2016-03-09 DIAGNOSIS — R0683 Snoring: Secondary | ICD-10-CM

## 2016-04-06 ENCOUNTER — Ambulatory Visit (HOSPITAL_BASED_OUTPATIENT_CLINIC_OR_DEPARTMENT_OTHER): Payer: 59 | Attending: Surgery | Admitting: Internal Medicine

## 2016-04-06 VITALS — Ht 60.0 in | Wt 275.0 lb

## 2016-04-06 DIAGNOSIS — G4733 Obstructive sleep apnea (adult) (pediatric): Secondary | ICD-10-CM | POA: Diagnosis not present

## 2016-04-06 DIAGNOSIS — R0683 Snoring: Secondary | ICD-10-CM

## 2016-04-06 DIAGNOSIS — G4761 Periodic limb movement disorder: Secondary | ICD-10-CM | POA: Insufficient documentation

## 2016-04-09 ENCOUNTER — Encounter: Payer: Self-pay | Admitting: Dietician

## 2016-04-09 ENCOUNTER — Encounter: Payer: 59 | Attending: Surgery | Admitting: Dietician

## 2016-04-09 DIAGNOSIS — Z713 Dietary counseling and surveillance: Secondary | ICD-10-CM | POA: Insufficient documentation

## 2016-04-09 NOTE — Progress Notes (Signed)
  Pre-Operative Nutrition Class:  Appt start time: 830   End time:  930.  Patient was seen on 04/09/2016 for Pre-Operative Bariatric Surgery Education at the [facility].   Surgery date:  Surgery type: Sleeve gastrectomy Start weight at [facility]: 276 lbs on 01/27/2016 Weight today: 268.2 lbs  TANITA  BODY COMP RESULTS  04/09/16   BMI (kg/m^2) 44.6   Fat Mass (lbs) 145.4   Fat Free Mass (lbs) 122.8   Total Body Water (lbs) 90.6   Samples given per MNT protocol. Patient educated on appropriate usage: Premier protein shake (chocolate - qty 1) Lot #: [id number] Exp: 02/2017  The following the learning objectives were met by the patient during this course:  Identify Pre-Op Dietary Goals and will begin 2 weeks pre-operatively  Identify appropriate sources of fluids and proteins   State protein recommendations and appropriate sources pre and post-operatively  Identify Post-Operative Dietary Goals and will follow for 2 weeks post-operatively  Identify appropriate multivitamin and calcium sources  Describe the need for physical activity post-operatively and will follow MD recommendations  State when to call healthcare provider regarding medication questions or post-operative complications  Handouts given during class include:  Pre-Op Bariatric Surgery Diet Handout  Protein Shake Handout  Post-Op Bariatric Surgery Nutrition Handout  BELT Program Information Flyer  Support Group Information Flyer  [facility] Bariatric Supplements Price List  Follow-Up Plan: Patient will follow-up at [facility] 2 weeks post operatively for diet advancement per MD.

## 2016-04-12 DIAGNOSIS — G4733 Obstructive sleep apnea (adult) (pediatric): Secondary | ICD-10-CM

## 2016-04-12 NOTE — Procedures (Signed)
Patient Name: Shalae, Lamke Date: 04/06/2016 Gender: Female D.O.B: 1960-11-21 Age (years): 54 Referring Provider: Luretha Murphy Height (inches): 64 Interpreting Physician: Jetty Duhamel MD, ABSM Weight (lbs): 271 RPSGT: Celene Kras BMI: 47 MRN: [id number] Neck Size: 17.00 CLINICAL INFORMATION Sleep Study Type: NPSG Indication for sleep study: Fatigue, Obesity, Snoring Epworth Sleepiness Score: 7  SLEEP STUDY TECHNIQUE As per the AASM Manual for the Scoring of Sleep and Associated Events v2.3 (April 2016) with a hypopnea requiring 4% desaturations. The channels recorded and monitored were frontal, central and occipital EEG, electrooculogram (EOG), submentalis EMG (chin), nasal and oral airflow, thoracic and abdominal wall motion, anterior tibialis EMG, snore microphone, electrocardiogram, and pulse oximetry.  MEDICATIONS Medications self-administered by patient taken the night of the study : none reported  SLEEP ARCHITECTURE The study was initiated at 10:19:01 PM and ended at 4:19:15 AM. Sleep onset time was 13.7 minutes and the sleep efficiency was 64.0%. The total sleep time was 230.5 minutes. Stage REM latency was 241.5 minutes. The patient spent 4.77% of the night in stage N1 sleep, 80.48% in stage N2 sleep, 0.00% in stage N3 and 14.75% in REM. Alpha intrusion was absent. Supine sleep was 6.51%. Wake after sleep onset 116 minutes  RESPIRATORY PARAMETERS The overall apnea/hypopnea index (AHI) was 17.4 per hour. There were 2 total apneas, including 2 obstructive, 0 central and 0 mixed apneas. There were 65 hypopneas and 41 RERAs. The AHI during Stage REM sleep was 15.9 per hour. AHI while supine was 12.0 per hour. The mean oxygen saturation was 92.44%. The minimum SpO2 during sleep was 85.00%. Loud snoring was noted during this study.  CARDIAC DATA The 2 lead EKG demonstrated sinus rhythm. The mean heart rate was 66.28 beats per minute. Other EKG findings  include: PVCs, PACs.  LEG MOVEMENT DATA The total PLMS were 50 with a resulting PLMS index of 13.02. Associated arousal with leg movement index was 2.3 .  IMPRESSIONS - Moderate obstructive sleep apnea occurred during this study (AHI = 17.4/h). - Insufficient early sleep and events to meet protocol requirements for split CPAP titration - No significant central sleep apnea occurred during this study (CAI = 0.0/h). - Mild oxygen desaturation was noted during this study (Min O2 = 85.00%). - The patient snored with Loud snoring volume. - EKG findings include PVC, PACs. - Mild periodic limb movements of sleep occurred during the study. No significant associated arousals.  DIAGNOSIS - Obstructive Sleep Apnea (327.23 [G47.33 ICD-10])  RECOMMENDATIONS - Therapeutic CPAP titration to determine optimal pressure required to alleviate sleep disordered breathing. - AutoPAP 5-20 could be used during upcoming surgical visit if appropriate. - Avoid alcohol, sedatives and other CNS depressants that may worsen sleep apnea and disrupt normal sleep architecture. - Sleep hygiene should be reviewed to assess factors that may improve sleep quality. - Weight management and regular exercise should be initiated or continued if appropriate.  [Electronically signed] 04/12/2016 12:30 PM  Jetty Duhamel MD, ABSM Diplomate, American Board of Sleep Medicine   NPI: [id number]  Waymon Budge Diplomate, American Board of Sleep Medicine  ELECTRONICALLY SIGNED ON:  04/12/2016, 12:25 PM [facility] PH: [phone]   FX: [phone] ACCREDITED BY THE AMERICAN ACADEMY OF SLEEP MEDICINE

## 2016-04-13 DIAGNOSIS — I10 Essential (primary) hypertension: Secondary | ICD-10-CM | POA: Diagnosis not present

## 2016-04-13 DIAGNOSIS — Z299 Encounter for prophylactic measures, unspecified: Secondary | ICD-10-CM | POA: Diagnosis not present

## 2016-04-16 MED FILL — KLOR-CON M10 TABLET: 10 | 90 days supply | Qty: 90 | Fill #0

## 2016-04-16 MED FILL — VALSARTAN-HCTZ 160-25 MG TA: 160-25 | 90 days supply | Qty: 90 | Fill #1

## 2016-04-16 MED FILL — TORSEMIDE 20 MG TABLET: 20 | 90 days supply | Qty: 40 | Fill #1

## 2016-04-16 MED FILL — RALOXIFENE HCL 60 MG TABLET: 60 | 90 days supply | Qty: 90 | Fill #1

## 2016-04-20 ENCOUNTER — Ambulatory Visit (INDEPENDENT_AMBULATORY_CARE_PROVIDER_SITE_OTHER): Payer: 59 | Admitting: Internal Medicine

## 2016-04-20 ENCOUNTER — Encounter: Payer: Self-pay | Admitting: Internal Medicine

## 2016-04-20 VITALS — BP 124/68 | HR 68 | Ht 65.5 in | Wt 269.6 lb

## 2016-04-20 DIAGNOSIS — G4733 Obstructive sleep apnea (adult) (pediatric): Secondary | ICD-10-CM | POA: Diagnosis not present

## 2016-04-20 DIAGNOSIS — R0609 Other forms of dyspnea: Secondary | ICD-10-CM

## 2016-04-20 NOTE — Progress Notes (Signed)
04/20/2016-55 year old female former smoker referred courtesy of Dr Hassell Done; pt is due for gastric bypass 05-14-16;needs clearance. Pt recently had sleep study-attached.  NPSG 04/06/2016- AHI 17.4/hour, oxygen desaturation to 85%, body weight 271 pounds Her husband has told her that she snores and she occasionally wakes her self snoring. At times feels unrested with some daytime sleepiness. Occasional caffeine but not a heavy user. Bedtime 9 or 9:30 PM, 20-30 minutes sleep latency, waking twice before up at 5 AM. ENT surgery-tonsils. Some dyspnea on exertion occasionally which she attributes to being overweight but no history of specific lung disease. Had cardiac workup in the past determining that she did not have pulmonary hypertension. Sleep study results reviewed with her. CXR 01/13/2016 IMPRESSION: No active cardiopulmonary disease.  Prior to Admission medications   Medication Sig Start Date End Date Taking? Authorizing Provider  acetaminophen (TYLENOL) 500 MG tablet Take 1,000 mg by mouth every 6 (six) hours as needed. For pain/fever    Yes Historical Provider, MD  BIOTIN PO Take 1 tablet by mouth daily.   Yes Historical Provider, MD  furosemide (LASIX) 20 MG tablet Take 1 tablet (20 mg total) by mouth daily. Patient taking differently: Take 20 mg by mouth every other day. Takes on days she doesn't take the torsemide. 06/13/15  Yes Butch Penny, NP  KLOR-CON M10 10 MEQ tablet  11/08/15  Yes Historical Provider, MD  loratadine (CLARITIN) 10 MG tablet Take 10 mg by mouth every morning.    Yes Historical Provider, MD  pantoprazole (PROTONIX) 40 MG tablet Take 1 tablet (40 mg total) by mouth daily. Patient taking differently: Take 40 mg by mouth every morning.  04/25/12  Yes Rogene Houston, MD  raloxifene (EVISTA) 60 MG tablet Take 60 mg by mouth every morning.    Yes Historical Provider, MD  torsemide (DEMADEX) 20 MG tablet Take 10 tablets by mouth 3 (three) times a week. 04/20/15  Yes  Historical Provider, MD  valsartan-hydrochlorothiazide (DIOVAN-HCT) 160-25 MG tablet Take 1 tablet by mouth every morning. 04/20/15  Yes Historical Provider, MD   Past Medical History:  Diagnosis Date  . Arthritis   . Edema, lower extremity   . GERD (gastroesophageal reflux disease)   . History of colon polyps    2013  hyperplastic  . History of esophagitis    2013-- gerd  . History of kidney stones   . Hypertension   . Left ureteral stone   . Nephrolithiasis    left non-obstuctive per ct 06-22-2015   Past Surgical History:  Procedure Laterality Date  . CARDIOVASCULAR STRESS TEST  12-26-2012   normal perfusion study/  no ischemia or scar  . CESAREAN SECTION  1987   w/  Bilateral Tubal Ligation  . CHOLECYSTECTOMY N/A 07/31/2013   Procedure: LAPAROSCOPIC CHOLECYSTECTOMY;  Surgeon: Jamesetta So, MD;  Location: [facility];  Service: General;  Laterality: N/A;  . COLONOSCOPY WITH ESOPHAGOGASTRODUODENOSCOPY (EGD)  04/25/2012   Procedure: COLONOSCOPY WITH ESOPHAGOGASTRODUODENOSCOPY (EGD);  Surgeon: Rogene Houston, MD;  Location: [facility];  Service: Endoscopy;  Laterality: N/A;  730  . CYSTOSCOPY W/ URETERAL STENT PLACEMENT Left 08/08/2015   Procedure: LEFT STENT REPLACEMENT;  Surgeon: Cleon Gustin, MD;  Location: [facility];  Service: Urology;  Laterality: Left;  . CYSTOSCOPY WITH RETROGRADE PYELOGRAM, URETEROSCOPY AND STENT PLACEMENT Left 07/25/2015   Procedure: CYSTOSCOPY WITH LEFT RETROGRADE,;  Surgeon: Cleon Gustin, MD;  Location: [facility];  Service: Urology;  Laterality: Left;  . CYSTOSCOPY/RETROGRADE/URETEROSCOPY/STONE EXTRACTION  WITH BASKET Left 08/08/2015   Procedure: CYSTOSCOPY LEFT RETROGRADE/URETEROSCOPY/STONE EXTRACTION WITH BASKET;  Surgeon: Cleon Gustin, MD;  Location: [facility];  Service: Urology;  Laterality: Left;  . DILATION AND CURETTAGE OF UTERUS  1986  . HOLMIUM LASER APPLICATION Left [id number]   Procedure: LEFT HOLMIUM  LASER STONE EXTRACTION  AND LEFT STENT PLACEMENT;  Surgeon: Cleon Gustin, MD;  Location: [facility];  Service: Urology;  Laterality: Left;  . HOLMIUM LASER APPLICATION Left [id number]   Procedure: HOLMIUM LASER APPLICATION;  Surgeon: Cleon Gustin, MD;  Location: [facility]  Service: Urology;  Laterality: Left;  . LIVER BIOPSY N/A 07/31/2013   Procedure: LIVER BIOPSY;  Surgeon: Jamesetta So, MD;  Location: [facility];  Service: General;  Laterality: N/A;  . Venia Minks DILATION  04/25/2012   Procedure: Venia Minks DILATION;  Surgeon: Rogene Houston, MD;  Location: [facility];  Service: Endoscopy;  Laterality: N/A;  . TONSILLECTOMY  1967  . TRANSTHORACIC ECHOCARDIOGRAM  12-26-2012   mild LVH,  ef 55-60%,  grade 1 diastolic dysfunction   Family History  Problem Relation Age of Onset  . Stroke Father   . Heart attack Father   . Alzheimer's disease Mother   . Diabetes Brother   . Alzheimer's disease Sister   . Colon cancer Paternal Uncle    Social History   Social History  . Marital status: Married    Spouse name: N/A  . Number of children: 2  . Years of education: N/A   Occupational History  . 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 [employer]   Social History Main Topics  . Smoking status: Former Smoker    Packs/day: 0.50    Years: 4.00    Types: Cigarettes    Quit date: 06/04/1981  . Smokeless tobacco: Never Used  . Alcohol use 0.0 oz/week     Comment: Rarely  . Drug use: No  . Sexual activity: Yes    Birth control/ protection: Surgical   Other Topics Concern  . Not on file   Social History Narrative  . No narrative on file   ROS-see HPI   Negative unless "+" Constitutional:    weight loss, night sweats, fevers, chills, +fatigue, lassitude. HEENT:    +headaches, difficulty swallowing, tooth/dental problems, sore throat,       sneezing, itching, ear ache, nasal congestion, post nasal drip, snoring CV:    chest pain, orthopnea, PND, swelling in  lower extremities, anasarca,                                                   dizziness, palpitations Resp:   shortness of breath with exertion or at rest.                productive cough,   non-productive cough, coughing up of blood.              change in color of mucus.  wheezing.   Skin:    rash or lesions. GI:    +heartburn, indigestion, abdominal pain, nausea, vomiting, diarrhea,                 change in bowel habits, loss of appetite GU: dysuria, change in color of urine, no urgency or frequency.   flank pain. MS:   joint  pain, stiffness, decreased range of motion, back pain. Neuro-     nothing unusual Psych:  change in mood or affect.  depression or anxiety.   memory loss.  OBJ- Physical Exam General- Alert, Oriented, Affect-appropriate, Distress- none acute, + Obese Skin- rash-none, lesions- none, excoriation- none Lymphadenopathy- none Head- atraumatic            Eyes- Gross vision intact, PERRLA, conjunctivae and secretions clear            Ears- Hearing, canals-normal            Nose- Clear, no-Septal dev, mucus, polyps, erosion, perforation             Throat- Mallampati II-III , mucosa clear , drainage- none, tonsils- atrophic Neck- flexible , trachea midline, no stridor , thyroid nl, carotid no bruit Chest - symmetrical excursion , unlabored           Heart/CV- RRR , no murmur , no gallop  , no rub, nl s1 s2                           - JVD- none , edema- none, stasis changes- none, varices- none           Lung- clear to P&A, wheeze- none, cough- none , dullness-none, rub- none           Chest wall-  Abd-  Br/ Gen/ Rectal- Not done, not indicated Extrem- cyanosis- none, clubbing, none, atrophy- none, strength- nl Neuro- grossly intact to observation

## 2016-04-20 NOTE — Assessment & Plan Note (Signed)
She reports that on subsequent cardiology follow-up she was not felt to have significant pulmonary hypertension. She considers her dyspnea to be associated with obesity and hopefully will improve with weight loss.

## 2016-04-20 NOTE — Assessment & Plan Note (Signed)
We reviewed her sleep study documenting moderately severe obstructive sleep apnea/hypopnea syndrome. We discussed the basic physiology, medical concerns and treatment. Weight loss anticipated with successful bariatric surgery would be expected to significantly improve her OSA eventually. Now getting ready for surgery-we discussed the specific issues of wearing CPAP/BiPAP at the hospital while sleeping. CPAP will be the best initial therapy for her. Plan-new CPAP starting with auto Pap 5-15.

## 2016-04-20 NOTE — Patient Instructions (Addendum)
Order- new DME, new CPAP auto 5-15, mask of choice, humidifier, supplies, AirView     Dx OSA  You will want to wear CPAP when you sleep at the hospital as well  Please call if we can help

## 2016-04-23 NOTE — Progress Notes (Signed)
Scheduling pre op appt--please place SURGICAL ORDERS in epic   Thanks

## 2016-04-24 ENCOUNTER — Telehealth (INDEPENDENT_AMBULATORY_CARE_PROVIDER_SITE_OTHER): Payer: Self-pay | Admitting: Internal Medicine

## 2016-04-24 DIAGNOSIS — J0121 Acute recurrent ethmoidal sinusitis: Secondary | ICD-10-CM

## 2016-04-24 MED ORDER — LEVOFLOXACIN 500 MG PO TABS: 500.0000 mg | ORAL_TABLET | Freq: Every day | ORAL | 0 refills | Status: DC

## 2016-04-24 MED FILL — levoFLOXacin 500 MG TABS: 500 | 10 days supply | Qty: 10 | Fill #0

## 2016-04-24 NOTE — Telephone Encounter (Signed)
Rx for Levaquin sent to [facility].

## 2016-04-30 DIAGNOSIS — G4733 Obstructive sleep apnea (adult) (pediatric): Secondary | ICD-10-CM | POA: Diagnosis not present

## 2016-05-02 ENCOUNTER — Ambulatory Visit: Payer: Self-pay | Admitting: Surgery

## 2016-05-02 MED FILL — PANTOPRAZOLE SOD DR 40 MG T: 40 | 30 days supply | Qty: 30 | Fill #0

## 2016-05-02 MED FILL — ONDANSETRON ODT 4 MG TABLET: 4 | 3 days supply | Qty: 15 | Fill #0

## 2016-05-02 MED FILL — oxyCODONE HCL 5 MG/5ML SOLN: 5 | 3 days supply | Qty: 200 | Fill #0

## 2016-05-08 ENCOUNTER — Ambulatory Visit: Payer: Self-pay | Admitting: Surgery

## 2016-05-08 NOTE — Patient Instructions (Signed)
Marilyn Martin  05/08/2016   Your procedure is scheduled on: 05/14/2016    Report to [facility]  Entrance take [facility]  elevators to 3rd floor to  [place] at    1150 AM.  Call this number if you have problems the morning of surgery [phone]   Remember: ONLY 1 PERSON MAY GO WITH YOU TO SHORT STAY TO GET  READY MORNING OF [place].  Do not eat food  :After Midnight. Clear liquids from 12 midnite until 0630am then nothing by mouth.       Take these medicines the morning of surgery with A SIP OF WATER: Claritin, Protonix                                 You may not have any metal on your body including hair pins and              piercings  Do not wear jewelry, make-up, lotions, powders or perfumes, deodorant             Do not wear nail polish.  Do not shave  48 hours prior to surgery.               Do not bring valuables to the hospital. [place].  Contacts, dentures or bridgework may not be worn into surgery.  Leave suitcase in the car. After surgery it may be brought to your room.    [name] Kitchen     CLEAR LIQUID DIET   Foods Allowed                                                                     Foods Excluded  Coffee and tea, regular and decaf                             liquids that you cannot  Plain Jell-O in any flavor                                             see through such as: Fruit ices (not with fruit pulp)                                     milk, soups, orange juice  Iced Popsicles                                    All solid food Carbonated beverages, regular and diet                                    Cranberry,  grape and apple juices Sports drinks like Gatorade Lightly seasoned clear broth or consume(fat free) Sugar, honey syrup  Sample Menu Breakfast                                Lunch                                     Supper Cranberry juice                     Beef broth                            Chicken broth Jell-O                                     Grape juice                           Apple juice Coffee or tea                        Jell-O                                      Popsicle                                                Coffee or tea                        Coffee or tea  _____________________________________________________________________    Special Instructions: N/A              Please read over the following fact sheets you were given: _____________________________________________________________________             [facility] - Preparing for Surgery Before surgery, you can play an important role.  Because skin is not sterile, your skin needs to be as free of germs as possible.  You can reduce the number of germs on your skin by washing with CHG (chlorahexidine gluconate) soap before surgery.  CHG is an antiseptic cleaner which kills germs and bonds with the skin to continue killing germs even after washing. Please DO NOT use if you have an allergy to CHG or antibacterial soaps.  If your skin becomes reddened/irritated stop using the CHG and inform your nurse when you arrive at Short Stay. Do not shave (including legs and underarms) for at least 48 hours prior to the first CHG shower.  You may shave your face/neck. Please follow these instructions carefully:  1.  Shower with CHG Soap the night before surgery and the  morning of Surgery.  2.  If you choose to wash your hair, wash your hair first as usual with your  normal  shampoo.  3.  After you shampoo, rinse your hair and body thoroughly to remove the  shampoo.  4.  Use CHG as you would any other liquid soap.  You can apply chg directly  to the skin and wash                       Gently with a scrungie or clean washcloth.  5.  Apply the CHG Soap to your body ONLY FROM THE NECK DOWN.   Do not use on face/ open                           Wound or open  sores. Avoid contact with eyes, ears mouth and genitals (private parts).                       Wash face,  Genitals (private parts) with your normal soap.             6.  Wash thoroughly, paying special attention to the area where your surgery  will be performed.  7.  Thoroughly rinse your body with warm water from the neck down.  8.  DO NOT shower/wash with your normal soap after using and rinsing off  the CHG Soap.                9.  Pat yourself dry with a clean towel.            10.  Wear clean pajamas.            11.  Place clean sheets on your bed the night of your first shower and do not  sleep with pets. Day of Surgery : Do not apply any lotions/deodorants the morning of surgery.  Please wear clean clothes to the hospital/surgery center.  FAILURE TO FOLLOW THESE INSTRUCTIONS MAY RESULT IN THE CANCELLATION OF YOUR SURGERY PATIENT SIGNATURE_________________________________  NURSE SIGNATURE__________________________________  ________________________________________________________________________

## 2016-05-09 ENCOUNTER — Encounter (HOSPITAL_COMMUNITY)
Admission: RE | Admit: 2016-05-09 | Discharge: 2016-05-09 | Disposition: A | Discharge status: Home / Self Care | Payer: 59 | Source: Ambulatory Visit | Attending: Surgery | Admitting: Surgery

## 2016-05-09 ENCOUNTER — Encounter (HOSPITAL_COMMUNITY): Payer: Self-pay

## 2016-05-09 ENCOUNTER — Telehealth (INDEPENDENT_AMBULATORY_CARE_PROVIDER_SITE_OTHER): Payer: Self-pay | Admitting: Internal Medicine

## 2016-05-09 DIAGNOSIS — Z6841 Body Mass Index (BMI) 40.0 and over, adult: Secondary | ICD-10-CM | POA: Diagnosis not present

## 2016-05-09 DIAGNOSIS — Z01812 Encounter for preprocedural laboratory examination: Secondary | ICD-10-CM | POA: Insufficient documentation

## 2016-05-09 HISTORY — DX: Sleep apnea, unspecified: G47.30

## 2016-05-09 LAB — COMPREHENSIVE METABOLIC PANEL
ALT: 35 U/L (ref 14–54)
AST: 23 U/L (ref 15–41)
Albumin: 4.6 g/dL (ref 3.5–5.0)
Alkaline Phosphatase: 60 U/L (ref 38–126)
Anion gap: 9 (ref 5–15)
BILIRUBIN TOTAL: 0.6 mg/dL (ref 0.3–1.2)
BUN: 21 mg/dL — ABNORMAL HIGH (ref 6–20)
CO2: 27 mmol/L (ref 22–32)
Calcium: 9.7 mg/dL (ref 8.9–10.3)
Chloride: 104 mmol/L (ref 101–111)
Creatinine, Ser: 0.78 mg/dL (ref 0.44–1.00)
GFR calc Af Amer: >60 mL/min (ref >60)
GLUCOSE: 93 mg/dL (ref 65–99)
Potassium: 3.9 mmol/L (ref 3.5–5.1)
Sodium: 140 mmol/L (ref 135–145)
Total Protein: 7.3 g/dL (ref 6.5–8.1)

## 2016-05-09 LAB — CBC WITH DIFFERENTIAL/PLATELET
Basophils Absolute: 0 K/uL (ref 0.0–0.1)
Basophils Relative: 0 %
Eosinophils Absolute: 0.1 K/uL (ref 0.0–0.7)
Eosinophils Relative: 1 %
HCT: 40.8 % (ref 36.0–46.0)
Hemoglobin: 14 g/dL (ref 12.0–15.0)
Lymphocytes Relative: 30 %
Lymphs Abs: 2.1 K/uL (ref 0.7–4.0)
MCH: 31 pg (ref 26.0–34.0)
MCHC: 34.3 g/dL (ref 30.0–36.0)
MCV: 90.3 fL (ref 78.0–100.0)
Monocytes Absolute: 0.5 K/uL (ref 0.1–1.0)
Monocytes Relative: 7 %
Neutro Abs: 4.3 K/uL (ref 1.7–7.7)
Neutrophils Relative %: 62 %
Platelets: 211 K/uL (ref 150–400)
RBC: 4.52 MIL/uL (ref 3.87–5.11)
RDW: 13.3 % (ref 11.5–15.5)
WBC: 6.9 K/uL (ref 4.0–10.5)

## 2016-05-09 MED ORDER — NYSTATIN 100000 UNIT/GM EX POWD: Freq: Four times a day (QID) | CUTANEOUS | 3 refills | Status: DC

## 2016-05-09 MED FILL — DICLOFENAC SODIUM 1% GEL: 1 | 13 days supply | Qty: 100 | Fill #1

## 2016-05-09 MED FILL — NYSTATIN 100,000 UNIT/GM PO: 100000 | 20 days supply | Qty: 30 | Fill #0

## 2016-05-09 NOTE — Progress Notes (Signed)
EKG and CXR done 01/13/16- [facility]

## 2016-05-09 NOTE — Telephone Encounter (Signed)
Rx sent to her pharmacy 

## 2016-05-09 NOTE — Progress Notes (Signed)
CMP done 05/09/16 faxed via EPIC to Dr Hassell Done

## 2016-05-09 NOTE — Progress Notes (Signed)
Temp- 99.2 at preop appointment.  FYI.  Patient voices no complaints.

## 2016-05-10 MED FILL — DICLOFENAC SOD 75 MG TAB EC: 75 | 25 days supply | Qty: 50 | Fill #0

## 2016-05-14 ENCOUNTER — Inpatient Hospital Stay (HOSPITAL_COMMUNITY)
Admission: RE | Admit: 2016-05-14 | Discharge: 2016-05-15 | DRG: 621 | Disposition: A | Discharge status: Home / Self Care | Payer: 59 | Source: Ambulatory Visit | Attending: Surgery | Admitting: Surgery

## 2016-05-14 ENCOUNTER — Inpatient Hospital Stay (HOSPITAL_COMMUNITY): Payer: 59 | Admitting: Anesthesiology

## 2016-05-14 ENCOUNTER — Encounter (HOSPITAL_COMMUNITY): Payer: Self-pay | Admitting: *Deleted

## 2016-05-14 ENCOUNTER — Encounter (HOSPITAL_COMMUNITY)
Admission: RE | Disposition: A | Discharge status: Home / Self Care | Payer: Self-pay | Source: Ambulatory Visit | Attending: Surgery

## 2016-05-14 DIAGNOSIS — G4730 Sleep apnea, unspecified: Secondary | ICD-10-CM | POA: Diagnosis present

## 2016-05-14 DIAGNOSIS — I10 Essential (primary) hypertension: Secondary | ICD-10-CM | POA: Diagnosis present

## 2016-05-14 DIAGNOSIS — Z833 Family history of diabetes mellitus: Secondary | ICD-10-CM

## 2016-05-14 DIAGNOSIS — G4733 Obstructive sleep apnea (adult) (pediatric): Secondary | ICD-10-CM | POA: Diagnosis not present

## 2016-05-14 DIAGNOSIS — Z87891 Personal history of nicotine dependence: Secondary | ICD-10-CM | POA: Diagnosis not present

## 2016-05-14 DIAGNOSIS — Z6841 Body Mass Index (BMI) 40.0 and over, adult: Secondary | ICD-10-CM

## 2016-05-14 DIAGNOSIS — K2950 Unspecified chronic gastritis without bleeding: Secondary | ICD-10-CM | POA: Diagnosis not present

## 2016-05-14 DIAGNOSIS — N200 Calculus of kidney: Secondary | ICD-10-CM | POA: Diagnosis not present

## 2016-05-14 DIAGNOSIS — K219 Gastro-esophageal reflux disease without esophagitis: Secondary | ICD-10-CM | POA: Diagnosis present

## 2016-05-14 DIAGNOSIS — Z9884 Bariatric surgery status: Secondary | ICD-10-CM

## 2016-05-14 HISTORY — PX: LAPAROSCOPIC GASTRIC SLEEVE RESECTION WITH HIATAL HERNIA REPAIR: SHX6512

## 2016-05-14 LAB — CBC
HEMATOCRIT: 41.4 % (ref 36.0–46.0)
Hemoglobin: 14.2 g/dL (ref 12.0–15.0)
MCH: 31 pg (ref 26.0–34.0)
MCHC: 34.3 g/dL (ref 30.0–36.0)
MCV: 90.4 fL (ref 78.0–100.0)
Platelets: 192 10*3/uL (ref 150–400)
RBC: 4.58 MIL/uL (ref 3.87–5.11)
RDW: 13.2 % (ref 11.5–15.5)
WBC: 11.4 10*3/uL — AB (ref 4.0–10.5)

## 2016-05-14 LAB — HEMOGLOBIN AND HEMATOCRIT, BLOOD
HCT: 42.4 % (ref 36.0–46.0)
Hemoglobin: 14.5 g/dL (ref 12.0–15.0)

## 2016-05-14 LAB — CREATININE, SERUM: CREATININE: 0.78 mg/dL (ref 0.44–1.00)

## 2016-05-14 SURGERY — GASTRECTOMY, SLEEVE, LAPAROSCOPIC, WITH HIATAL HERNIA REPAIR
Anesthesia: General

## 2016-05-14 MED ORDER — CHLORHEXIDINE GLUCONATE CLOTH 2 % EX PADS: 6.0000 | MEDICATED_PAD | Freq: Once | CUTANEOUS | Status: DC

## 2016-05-14 MED ORDER — SUFENTANIL CITRATE 50 MCG/ML IV SOLN
INTRAVENOUS | Status: DC | PRN
  Administered 2016-05-14 (×2): 5 ug via INTRAVENOUS
  Administered 2016-05-14: 20 ug via INTRAVENOUS
  Administered 2016-05-14: 10 ug via INTRAVENOUS

## 2016-05-14 MED ORDER — LIDOCAINE 2% (20 MG/ML) 5 ML SYRINGE
INTRAMUSCULAR | Status: AC
  Filled 2016-05-14: qty 5

## 2016-05-14 MED ORDER — SUGAMMADEX SODIUM 500 MG/5ML IV SOLN
INTRAVENOUS | Status: AC
  Filled 2016-05-14: qty 5

## 2016-05-14 MED ORDER — MORPHINE SULFATE (PF) 2 MG/ML IV SOLN
2.0000 mg | INTRAVENOUS | Status: DC | PRN
  Administered 2016-05-14: 4 mg via INTRAVENOUS
  Filled 2016-05-14: qty 2

## 2016-05-14 MED ORDER — SCOPOLAMINE 1 MG/3DAYS TD PT72
MEDICATED_PATCH | TRANSDERMAL | Status: AC
  Filled 2016-05-14: qty 1

## 2016-05-14 MED ORDER — ONDANSETRON HCL 4 MG/2ML IJ SOLN
INTRAMUSCULAR | Status: AC
  Filled 2016-05-14: qty 2

## 2016-05-14 MED ORDER — HEPARIN SODIUM (PORCINE) 5000 UNIT/ML IJ SOLN
5000.0000 [IU] | Freq: Three times a day (TID) | INTRAMUSCULAR | Status: DC
  Administered 2016-05-14 – 2016-05-15 (×3): 5000 [IU] via SUBCUTANEOUS
  Filled 2016-05-14 (×3): qty 1

## 2016-05-14 MED ORDER — ROCURONIUM BROMIDE 10 MG/ML (PF) SYRINGE
PREFILLED_SYRINGE | INTRAVENOUS | Status: DC | PRN
  Administered 2016-05-14: 10 mg via INTRAVENOUS
  Administered 2016-05-14: 50 mg via INTRAVENOUS

## 2016-05-14 MED ORDER — PROPOFOL 10 MG/ML IV BOLUS
INTRAVENOUS | Status: AC
  Filled 2016-05-14: qty 20

## 2016-05-14 MED ORDER — ONDANSETRON HCL 4 MG/2ML IJ SOLN: 4.0000 mg | INTRAMUSCULAR | Status: DC | PRN

## 2016-05-14 MED ORDER — ROCURONIUM BROMIDE 50 MG/5ML IV SOSY
PREFILLED_SYRINGE | INTRAVENOUS | Status: AC
  Filled 2016-05-14: qty 5

## 2016-05-14 MED ORDER — CEFOTETAN DISODIUM-DEXTROSE 2-2.08 GM-% IV SOLR
INTRAVENOUS | Status: AC
  Filled 2016-05-14: qty 50

## 2016-05-14 MED ORDER — APREPITANT 80 MG PO CAPS
80.0000 mg | ORAL_CAPSULE | ORAL | Status: AC
  Administered 2016-05-14: 80 mg via ORAL
  Filled 2016-05-14: qty 1

## 2016-05-14 MED ORDER — OXYCODONE HCL 5 MG/5ML PO SOLN: 5.0000 mg | ORAL | Status: DC | PRN

## 2016-05-14 MED ORDER — PREMIER PROTEIN SHAKE: 2.0000 [oz_av] | ORAL | Status: DC

## 2016-05-14 MED ORDER — SODIUM CHLORIDE 0.9 % IJ SOLN
INTRAMUSCULAR | Status: AC
  Filled 2016-05-14: qty 10

## 2016-05-14 MED ORDER — FENTANYL CITRATE (PF) 100 MCG/2ML IJ SOLN
25.0000 ug | INTRAMUSCULAR | Status: DC | PRN
  Administered 2016-05-14: 50 ug via INTRAVENOUS
  Administered 2016-05-14 (×2): 25 ug via INTRAVENOUS

## 2016-05-14 MED ORDER — SODIUM CHLORIDE 0.9 % IJ SOLN
INTRAMUSCULAR | Status: DC | PRN
  Administered 2016-05-14: 10 mL via INTRAVENOUS

## 2016-05-14 MED ORDER — MIDAZOLAM HCL 2 MG/2ML IJ SOLN
INTRAMUSCULAR | Status: AC
  Filled 2016-05-14: qty 2

## 2016-05-14 MED ORDER — KCL IN DEXTROSE-NACL 20-5-0.45 MEQ/L-%-% IV SOLN
INTRAVENOUS | Status: DC
  Administered 2016-05-14: 19:00:00 via INTRAVENOUS
  Administered 2016-05-15: 1000 mL via INTRAVENOUS
  Filled 2016-05-14 (×3): qty 1000

## 2016-05-14 MED ORDER — SUCCINYLCHOLINE CHLORIDE 200 MG/10ML IV SOSY
PREFILLED_SYRINGE | INTRAVENOUS | Status: AC
  Filled 2016-05-14: qty 10

## 2016-05-14 MED ORDER — CEFOTETAN DISODIUM-DEXTROSE 2-2.08 GM-% IV SOLR
2.0000 g | INTRAVENOUS | Status: AC
  Administered 2016-05-14: 2 g via INTRAVENOUS

## 2016-05-14 MED ORDER — PROMETHAZINE HCL 25 MG/ML IJ SOLN
6.2500 mg | INTRAMUSCULAR | Status: DC | PRN
  Administered 2016-05-14: 12.5 mg via INTRAVENOUS

## 2016-05-14 MED ORDER — SUCCINYLCHOLINE CHLORIDE 200 MG/10ML IV SOSY
PREFILLED_SYRINGE | INTRAVENOUS | Status: DC | PRN
  Administered 2016-05-14: 140 mg via INTRAVENOUS

## 2016-05-14 MED ORDER — MIDAZOLAM HCL 2 MG/2ML IJ SOLN
INTRAMUSCULAR | Status: DC | PRN
Start: 2016-05-14 — End: 2016-05-14
  Administered 2016-05-14: 2 mg via INTRAVENOUS

## 2016-05-14 MED ORDER — DEXAMETHASONE SODIUM PHOSPHATE 10 MG/ML IJ SOLN
INTRAMUSCULAR | Status: AC
  Filled 2016-05-14: qty 1

## 2016-05-14 MED ORDER — ACETAMINOPHEN 160 MG/5ML PO SOLN
325.0000 mg | ORAL | Status: DC | PRN
Start: 2016-05-15 — End: 2016-05-15

## 2016-05-14 MED ORDER — SODIUM CHLORIDE 0.9 % IJ SOLN
INTRAMUSCULAR | Status: AC
Start: 2016-05-14 — End: 2016-05-14
  Filled 2016-05-14: qty 10

## 2016-05-14 MED ORDER — HYDROMORPHONE HCL 1 MG/ML IJ SOLN
INTRAMUSCULAR | Status: AC
  Filled 2016-05-14: qty 0.5

## 2016-05-14 MED ORDER — HYDROMORPHONE HCL 1 MG/ML IJ SOLN
0.5000 mg | INTRAMUSCULAR | Status: DC | PRN
  Administered 2016-05-14 (×2): 0.25 mg via INTRAVENOUS

## 2016-05-14 MED ORDER — BUPIVACAINE LIPOSOME 1.3 % IJ SUSP
20.0000 mL | Freq: Once | INTRAMUSCULAR | Status: AC
Start: 2016-05-14 — End: 2016-05-14
  Administered 2016-05-14: 20 mL
  Filled 2016-05-14: qty 20

## 2016-05-14 MED ORDER — SUFENTANIL CITRATE 50 MCG/ML IV SOLN
INTRAVENOUS | Status: AC
Start: 2016-05-14 — End: 2016-05-14
  Filled 2016-05-14: qty 1

## 2016-05-14 MED ORDER — SCOPOLAMINE 1 MG/3DAYS TD PT72
MEDICATED_PATCH | TRANSDERMAL | Status: DC | PRN
  Administered 2016-05-14: 1 via TRANSDERMAL

## 2016-05-14 MED ORDER — ONDANSETRON HCL 4 MG/2ML IJ SOLN
INTRAMUSCULAR | Status: DC | PRN
  Administered 2016-05-14: 4 mg via INTRAVENOUS

## 2016-05-14 MED ORDER — ACETAMINOPHEN 10 MG/ML IV SOLN
INTRAVENOUS | Status: AC
  Filled 2016-05-14: qty 100

## 2016-05-14 MED ORDER — LACTATED RINGERS IV SOLN
INTRAVENOUS | Status: DC | PRN
  Administered 2016-05-14 (×2): via INTRAVENOUS

## 2016-05-14 MED ORDER — CEFOTETAN DISODIUM-DEXTROSE 2-2.08 GM-% IV SOLR: 2.0000 g | INTRAVENOUS | Status: DC

## 2016-05-14 MED ORDER — HEPARIN SODIUM (PORCINE) 5000 UNIT/ML IJ SOLN
5000.0000 [IU] | INTRAMUSCULAR | Status: AC
  Administered 2016-05-14: 5000 [IU] via SUBCUTANEOUS
  Filled 2016-05-14: qty 1

## 2016-05-14 MED ORDER — SUGAMMADEX SODIUM 200 MG/2ML IV SOLN
INTRAVENOUS | Status: DC | PRN
  Administered 2016-05-14: 300 mg via INTRAVENOUS

## 2016-05-14 MED ORDER — ACETAMINOPHEN 10 MG/ML IV SOLN
INTRAVENOUS | Status: DC | PRN
  Administered 2016-05-14: 1000 mg via INTRAVENOUS

## 2016-05-14 MED ORDER — LIDOCAINE 2% (20 MG/ML) 5 ML SYRINGE
INTRAMUSCULAR | Status: DC | PRN
  Administered 2016-05-14: 100 mg via INTRAVENOUS

## 2016-05-14 MED ORDER — 0.9 % SODIUM CHLORIDE (POUR BTL) OPTIME
TOPICAL | Status: DC | PRN
  Administered 2016-05-14: 1000 mL

## 2016-05-14 MED ORDER — HEPARIN SODIUM (PORCINE) 5000 UNIT/ML IJ SOLN: 5000.0000 [IU] | INTRAMUSCULAR | Status: DC

## 2016-05-14 MED ORDER — DEXAMETHASONE SODIUM PHOSPHATE 10 MG/ML IJ SOLN
INTRAMUSCULAR | Status: DC | PRN
  Administered 2016-05-14: 10 mg via INTRAVENOUS

## 2016-05-14 MED ORDER — ACETAMINOPHEN 160 MG/5ML PO SOLN: 650.0000 mg | ORAL | Status: DC | PRN

## 2016-05-14 MED ORDER — LACTATED RINGERS IV SOLN: INTRAVENOUS | Status: DC

## 2016-05-14 MED ORDER — LACTATED RINGERS IR SOLN
Status: DC | PRN
  Administered 2016-05-14: 3000 mL

## 2016-05-14 MED ORDER — PROMETHAZINE HCL 25 MG/ML IJ SOLN
INTRAMUSCULAR | Status: AC
  Filled 2016-05-14: qty 1

## 2016-05-14 MED ORDER — PROPOFOL 10 MG/ML IV BOLUS
INTRAVENOUS | Status: DC | PRN
  Administered 2016-05-14: 200 mg via INTRAVENOUS

## 2016-05-14 MED ORDER — FENTANYL CITRATE (PF) 100 MCG/2ML IJ SOLN
INTRAMUSCULAR | Status: AC
  Filled 2016-05-14: qty 2

## 2016-05-14 SURGICAL SUPPLY — 71 items
ADH SKN CLS APL DERMABOND .7 (GAUZE/BANDAGES/DRESSINGS) ×1
APL SRG 32X5 SNPLK LF DISP (MISCELLANEOUS)
APPLICATOR COTTON TIP 6IN STRL (MISCELLANEOUS) IMPLANT
APPLIER CLIP ROT 10 11.4 M/L (STAPLE)
APPLIER CLIP ROT 13.4 12 LRG (CLIP)
APR CLP LRG 13.4X12 ROT 20 MLT (CLIP)
APR CLP MED LRG 11.4X10 (STAPLE)
BAG SPEC RTRVL LRG 6X4 10 (ENDOMECHANICALS)
BLADE SURG 15 STRL LF DISP TIS (BLADE) ×1 IMPLANT
BLADE SURG 15 STRL SS (BLADE) ×3
CABLE HIGH FREQUENCY MONO STRZ (ELECTRODE) ×2 IMPLANT
CLIP APPLIE ROT 10 11.4 M/L (STAPLE) IMPLANT
CLIP APPLIE ROT 13.4 12 LRG (CLIP) IMPLANT
COVER SURGICAL LIGHT HANDLE (MISCELLANEOUS) ×1 IMPLANT
DERMABOND ADVANCED (GAUZE/BANDAGES/DRESSINGS) ×2
DERMABOND ADVANCED .7 DNX12 (GAUZE/BANDAGES/DRESSINGS) IMPLANT
DEVICE SUT QUICK LOAD TK 5 (STAPLE) IMPLANT
DEVICE SUT TI-KNOT TK 5X26 (MISCELLANEOUS) IMPLANT
DEVICE SUTURE ENDOST 10MM (ENDOMECHANICALS) IMPLANT
DEVICE TI KNOT TK5 (MISCELLANEOUS)
DEVICE TROCAR PUNCTURE CLOSURE (ENDOMECHANICALS) ×3 IMPLANT
DISSECTOR BLUNT TIP ENDO 5MM (MISCELLANEOUS) ×1 IMPLANT
ELECT REM PT RETURN 9FT ADLT (ELECTROSURGICAL) ×3
ELECTRODE REM PT RTRN 9FT ADLT (ELECTROSURGICAL) ×1 IMPLANT
GAUZE SPONGE 4X4 12PLY STRL (GAUZE/BANDAGES/DRESSINGS) IMPLANT
GLOVE BIOGEL M 8.0 STRL (GLOVE) ×3 IMPLANT
GOWN STRL REUS W/TWL XL LVL3 (GOWN DISPOSABLE) ×11 IMPLANT
HANDLE STAPLE EGIA 4 XL (STAPLE) ×3 IMPLANT
HOVERMATT SINGLE USE (MISCELLANEOUS) ×3 IMPLANT
IRRIG SUCT STRYKERFLOW 2 WTIP (MISCELLANEOUS) ×3
IRRIGATION SUCT STRKRFLW 2 WTP (MISCELLANEOUS) ×1 IMPLANT
KIT BASIN OR (CUSTOM PROCEDURE TRAY) ×3 IMPLANT
MARKER SKIN DUAL TIP RULER LAB (MISCELLANEOUS) ×3 IMPLANT
NDL SPNL 22GX3.5 QUINCKE BK (NEEDLE) ×1 IMPLANT
NEEDLE SPNL 22GX3.5 QUINCKE BK (NEEDLE) ×3 IMPLANT
PACK UNIVERSAL I (CUSTOM PROCEDURE TRAY) ×3 IMPLANT
POUCH SPECIMEN RETRIEVAL 10MM (ENDOMECHANICALS) IMPLANT
QUICK LOAD TK 5 (STAPLE)
RELOAD ENDO STITCH (ENDOMECHANICALS) IMPLANT
RELOAD STAPLE 45 PURP MED/THCK (STAPLE) IMPLANT
RELOAD SUT TRIPLE-STITCH 2-0 (ENDOMECHANICALS) IMPLANT
RELOAD TRI 45 ART MED THCK BLK (STAPLE) ×5 IMPLANT
RELOAD TRI 45 ART MED THCK PUR (STAPLE) ×3 IMPLANT
RELOAD TRI 60 ART MED THCK BLK (STAPLE) ×3 IMPLANT
RELOAD TRI 60 ART MED THCK PUR (STAPLE) ×7 IMPLANT
SCISSORS LAP 5X45 EPIX DISP (ENDOMECHANICALS) IMPLANT
SEALANT SURGICAL APPL DUAL CAN (MISCELLANEOUS) IMPLANT
SHEARS HARMONIC ACE PLUS 45CM (MISCELLANEOUS) ×3 IMPLANT
SLEEVE ADV FIXATION 5X100MM (TROCAR) ×6 IMPLANT
SLEEVE GASTRECTOMY 36FR VISIGI (MISCELLANEOUS) ×3 IMPLANT
SOLUTION ANTI FOG 6CC (MISCELLANEOUS) ×3 IMPLANT
SPONGE LAP 18X18 X RAY DECT (DISPOSABLE) ×3 IMPLANT
STAPLER VISISTAT 35W (STAPLE) ×2 IMPLANT
SUT VIC AB 4-0 SH 18 (SUTURE) ×3 IMPLANT
SUT VICRYL 0 TIES 12 18 (SUTURE) ×3 IMPLANT
SYR 10ML ECCENTRIC (SYRINGE) ×3 IMPLANT
SYR 20CC LL (SYRINGE) ×3 IMPLANT
SYR 50ML LL SCALE MARK (SYRINGE) ×3 IMPLANT
SYSTEM WECK SHIELD CLOSURE (TROCAR) IMPLANT
TOWEL OR 17X26 10 PK STRL BLUE (TOWEL DISPOSABLE) ×3 IMPLANT
TOWEL OR NON WOVEN STRL DISP B (DISPOSABLE) ×3 IMPLANT
TRAY FOLEY W/METER SILVER 16FR (SET/KITS/TRAYS/PACK) IMPLANT
TROCAR ADV FIXATION 12X100MM (TROCAR) ×3 IMPLANT
TROCAR ADV FIXATION 5X100MM (TROCAR) ×3 IMPLANT
TROCAR BLADELESS 15MM (ENDOMECHANICALS) ×3 IMPLANT
TROCAR BLADELESS OPT 5 100 (ENDOMECHANICALS) ×3 IMPLANT
TUBE CALIBRATION LAPBAND (TUBING) IMPLANT
TUBING CONNECTING 10 (TUBING) ×2 IMPLANT
TUBING CONNECTING 10' (TUBING) ×1
TUBING ENDO SMARTCAP (MISCELLANEOUS) ×3 IMPLANT
TUBING INSUF HEATED (TUBING) ×3 IMPLANT

## 2016-05-14 NOTE — Op Note (Signed)
Name:  Marilyn Martin MRN: [id number] Date of Surgery: 05/14/2016  Preop Diagnosis:  Morbid Obesity  Postop Diagnosis:  Morbid Obesity (Weight - 271, BMI - 46.6), S/P Gastric Sleeve  Procedure:  Upper endoscopy  (Intraoperative)  Surgeon:  Alphonsa Overall, M.D.  Anesthesia:  GET  Indications for procedure: Marilyn Martin is a 55 y.o. female whose primary care physician is Glenda Chroman, MD and has completed a Gastric Sleeve today by Dr. Hassell Done.  I am doing an intraoperative upper endoscopy to evaluate the gastric pouch.  Operative Note: The patient is under general anesthesia.  Dr. Hassell Done is laparoscoping the patient while I do an upper endoscopy to evaluate the stomach pouch.  With the patient intubated, I passed the Olympus upper endoscope without difficulty down the esophagus.  The esophagus was unremarkable.  The esophago-gastric junction was at 38 cm.    The mucosa of the stomach looked viable and the staple line was intact without bleeding.  I advanced to the pylorus, but did not go through it.  While I insufflated the stomach pouch with air, Dr. Hassell Done  flooded the upper abdomen with saline to put the gastric pouch under saline.  There was no bubbling or evidence of a leak.  There was no evidence of narrowing of the pouch and the gastric sleeve looked tubular.  The scope was then withdrawn.  The esophagus was unremarkable and the patient tolerated the endoscopy without difficulty.  Alphonsa Overall, MD, [facility] Surgery Pager: [phone] Office phone:  [phone]

## 2016-05-14 NOTE — Op Note (Signed)
Surgeon: Kaylyn Lim, MD, FACS  Asst:  Alphonsa Overall, MD,FACS  Anes:  General endotracheal  Procedure: Laparoscopic sleeve gastrectomy and upper endoscopy  Diagnosis: Morbid obesity  Complications: none  EBL:   12 cc  Description of Procedure:  The patient was take to OR 1 and given general anesthesia.  The abdomen was prepped with Technicare and draped sterilely.  A timeout was performed.  Access to the abdomen was achieved with a 5 mm Optiview through the left upper quadrant.  Following insufflation, the state of the abdomen was found to be free of adhesions.  The balloon calibration test was performed and no hiatal hernia defect was present.  There was no visible anterior dimple.    The ViSiGi 36Fr tube was inserted to deflate the stomach and was pulled back into the esophagus.    The pylorus was identified and we measured 5 cm back and marked the antrum.  At that point we began dissection to take down the greater curvature of the stomach using the Harmonic scalpel.  This dissection was taken all the way up to the left crus.  Posterior attachments of the stomach were also taken down.    The ViSiGi tube was then passed into the antrum and suction applied so that it was snug along the lessor curvature.  The "crow's foot" or incisura was identified.  The sleeve gastrectomy was begun using the [employer] stapler beginning with a 4.5 cm black load with TRS.  This was followed by a 6 cm black load with TRS and then purple loads all with TRS.  When the sleeve was complete the tube was taken off suction and insufflated briefly.  The tube was withdrawn.  Upper endoscopy was then performed by Dr. Lucia Gaskins and no bubbles or bleeding was seen.     The specimen was extracted through the 15 trocar site.  Wounds were infiltrated with Exparel  and closed with 4-0 Monocryl.    Matt B. Hassell Done, [place], [facility], [place]

## 2016-05-14 NOTE — Interval H&P Note (Signed)
History and Physical Interval Note:  05/14/2016 1:14 PM  Marilyn Martin  has presented today for surgery, with the diagnosis of MORBID OBESITY  The various methods of treatment have been discussed with the patient and family. After consideration of risks, benefits and other options for treatment, the patient has consented to  Procedure(s): LAPAROSCOPIC GASTRIC SLEEVE RESECTION WITH HIATAL HERNIA REPAIR (N/A) as a surgical intervention .  The patient's history has been reviewed, patient examined, no change in status, stable for surgery.  I have reviewed the patient's chart and labs.  Questions were answered to the patient's satisfaction.     Jaskiran Pata B

## 2016-05-14 NOTE — H&P (Signed)
Marilyn Martin Location: [facility] Surgery Patient #: [id number] DOB: 07-25-1960 Married / Language: English / Race: White Female   History of Present Illness  The patient is a 55 year old female who presents for a sleeve gastrectomy. Current treatment includes weight loss group (She has been on weight watchers for years and has the documentation to support this.). Associated conditions include sleep apnea (Scored 10 on Epworth scale and I will refer to sleep center). Reported interest in weight loss is high (Has been on multiple diets and followed weight watchers for years). Pertinent family history includes diabetes (father and brother). She has been on the [employer]. She has had a prior C section and lap chole. 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 Today's weight is 271 with a BMI 46.62.  I discussed sleeve gastrectomy with her in some detail including complications not limited to bleeding or staple line leakage. She is aware of these risks. She has done her homework wants to pursue sleeve gastrectomy.   Other Problems  Arthritis  Back Pain  Cholelithiasis  Gastroesophageal Reflux Disease  Hemorrhoids  High blood pressure  Kidney Stone   Past Surgical History  Cesarean Section - 1  Colon Polyp Removal - Colonoscopy  Gallbladder Surgery - Laparoscopic  Tonsillectomy   Diagnostic Studies History  Colonoscopy  1-5 years ago Mammogram  within last year Pap Smear  1-5 years ago  Allergies No Known Drug Allergies 12/28/2015  Medication History Valsartan-Hydrochlorothiazide (160-25MG  Tablet, Oral) Active. Torsemide (20MG  Tablet, Oral) Active. Furosemide (20MG  Tablet, Oral) Active. Klor-Con M10 ( Tablet ER, Oral) Active. Diclofenac Sodium (75MG  Tablet DR, Oral) Active. Evista (60MG  Tablet, Oral) Active. Tylenol (500MG  Capsule, Oral) Active. Medications  Reconciled  Social History Alcohol use  Occasional alcohol use. Caffeine use  Carbonated beverages, Coffee, Tea. No drug use  Tobacco use  Former smoker.  Family History  Arthritis  Father, Mother. Cerebrovascular Accident  Father. Cervical Cancer  Mother. Depression  Sister. Diabetes Mellitus  Brother, Father. Heart Disease  Brother, Father. Hypertension  Brother, Father. Kidney Disease  Sister. Migraine Headache  Daughter. Respiratory Condition  Father. Thyroid problems  Mother.  Pregnancy / Birth History  Age at menarche  10 years. Age of menopause  77-55 Gravida  3 Irregular periods  Maternal age  20-20 Para  2    Review of Systems  General Present- Weight Gain. Not Present- Appetite Loss, Chills, Fatigue, Fever, Night Sweats and Weight Loss. Skin Not Present- Change in Wart/Mole, Dryness, Hives, Jaundice, New Lesions, Non-Healing Wounds, Rash and Ulcer. HEENT Present- Seasonal Allergies and Wears glasses/contact lenses. Not Present- Earache, Hearing Loss, Hoarseness, Nose Bleed, Oral Ulcers, Ringing in the Ears, Sinus Pain, Sore Throat, Visual Disturbances and Yellow Eyes. Respiratory Present- Snoring. Not Present- Bloody sputum, Chronic Cough, Difficulty Breathing and Wheezing. Breast Not Present- Breast Mass, Breast Pain, Nipple Discharge and Skin Changes. Cardiovascular Present- Shortness of Breath and Swelling of Extremities. Not Present- Chest Pain, Difficulty Breathing Lying Down, Leg Cramps, Palpitations and Rapid Heart Rate. Gastrointestinal Present- Constipation and Hemorrhoids. Not Present- Abdominal Pain, Bloating, Bloody Stool, Change in Bowel Habits, Chronic diarrhea, Difficulty Swallowing, Excessive gas, Gets full quickly at meals, Indigestion, Nausea, Rectal Pain and Vomiting. Female Genitourinary Present- Urgency. Not Present- Frequency, Nocturia, Painful Urination and Pelvic Pain. Musculoskeletal Present- Back Pain, Joint Pain, Joint  Stiffness and Swelling of Extremities. Not Present- Muscle Pain and Muscle Weakness. Neurological Present- Headaches. Not Present- Decreased  Memory, Fainting, Numbness, Seizures, Tingling, Tremor, Trouble walking and Weakness. Psychiatric Not Present- Anxiety, Bipolar, Change in Sleep Pattern, Depression, Fearful and Frequent crying. Endocrine Present- Hot flashes. Not Present- Cold Intolerance, Excessive Hunger, Hair Changes, Heat Intolerance and New Diabetes. Hematology Not Present- Blood Thinners, Easy Bruising, Excessive bleeding, Gland problems, HIV and Persistent Infections.  Vitals  Weight: 271.6 lb Height: 64in Body Surface Area: 2.23 m Body Mass Index: 46.62 kg/m  Temp.: 98.26F(Oral)  Pulse: 98 (Regular)  BP: 136/82 (Sitting, Left Arm, Standard  Physical Exam  The physical exam findings are as follows: Note:Younger than age appearing WF NAD HEENT unremarkable Neck supple Chest clear Heart SR without murmurs Abdomen obese Ext FROM without edema or clubbing Neuro alert and oriented x 3    Assessment & Plan Marilyn Hazard B. Daphine Deutscher MD; 12/28/2015 2:27 PM) OBESITY, MORBID, BMI 40.0-49.9 (E66.01)  For sleeve gastrectomy  Wenda Low, MD, FACS

## 2016-05-14 NOTE — Discharge Instructions (Addendum)

## 2016-05-14 NOTE — Transfer of Care (Signed)
Immediate Anesthesia Transfer of Care Note  Patient: Marilyn Martin  Procedure(s) Performed: Procedure(s): LAPAROSCOPIC GASTRIC SLEEVE RESECTION WITH HIATAL HERNIA REPAIR (N/A)  Patient Location: PACU  Anesthesia Type:General  Level of Consciousness:  sedated, patient cooperative and responds to stimulation  Airway & Oxygen Therapy:Patient Spontanous Breathing and Patient connected to face mask oxgen  Post-op Assessment:  Report given to PACU RN and Post -op Vital signs reviewed and stable  Post vital signs:  Reviewed and stable  Last Vitals:  Vitals:   05/14/16 1150  BP: 104/67  Pulse: 86  Resp: 18  Temp: 123XX123 C    Complications: No apparent anesthesia complications

## 2016-05-14 NOTE — Progress Notes (Signed)
Pt is on NIV at this time tolerating it well. Pt states that she use auto bipap at home 15-4 IPAP-EPAP max-min. No distress noted.

## 2016-05-14 NOTE — Anesthesia Procedure Notes (Signed)
Procedure Name: Intubation Date/Time: 05/14/2016 1:58 PM Performed by: Danley Danker L Patient Re-evaluated:Patient Re-evaluated prior to inductionOxygen Delivery Method: Circle system utilized Preoxygenation: Pre-oxygenation with 100% oxygen Intubation Type: IV induction Ventilation: Mask ventilation without difficulty and Oral airway inserted - appropriate to patient size Laryngoscope Size: Miller and 2 Grade View: Grade II Tube type: Oral Tube size: 7.5 mm Number of attempts: 1 Airway Equipment and Method: Stylet Placement Confirmation: ETT inserted through vocal cords under direct vision,  positive ETCO2 and breath sounds checked- equal and bilateral Secured at: 21 cm Tube secured with: Tape Dental Injury: Teeth and Oropharynx as per pre-operative assessment

## 2016-05-14 NOTE — Anesthesia Preprocedure Evaluation (Signed)
Anesthesia Evaluation  Patient identified by MRN, date of birth, ID band Patient awake    Reviewed: Allergy & Precautions, NPO status , Patient's Chart, lab work & pertinent test results  Airway Mallampati: II  TM Distance: >3 FB Neck ROM: Full    Dental  (+) Dental Advisory Given   Pulmonary sleep apnea , former smoker,    breath sounds clear to auscultation       Cardiovascular hypertension, Pt. on medications  Rhythm:Regular Rate:Normal     Neuro/Psych negative neurological ROS     GI/Hepatic Neg liver ROS, GERD  ,  Endo/Other  Morbid obesity  Renal/GU Renal disease     Musculoskeletal  (+) Arthritis ,   Abdominal   Peds  Hematology negative hematology ROS (+)   Anesthesia Other Findings   Reproductive/Obstetrics                             Anesthesia Physical Anesthesia Plan  ASA: III  Anesthesia Plan: General   Post-op Pain Management:    Induction: Intravenous  Airway Management Planned: Oral ETT  Additional Equipment:   Intra-op Plan:   Post-operative Plan: Extubation in OR  Informed Consent: I have reviewed the patients History and Physical, chart, labs and discussed the procedure including the risks, benefits and alternatives for the proposed anesthesia with the patient or authorized representative who has indicated his/her understanding and acceptance.   Dental advisory given  Plan Discussed with: CRNA  Anesthesia Plan Comments:         Anesthesia Quick Evaluation

## 2016-05-14 NOTE — Anesthesia Postprocedure Evaluation (Signed)
Anesthesia Post Note  Patient: Marilyn Martin  Procedure(s) Performed: Procedure(s) (LRB): LAPAROSCOPIC GASTRIC SLEEVE RESECTION WITH HIATAL HERNIA REPAIR (N/A)  Patient location during evaluation: PACU Anesthesia Type: General Level of consciousness: awake and alert Pain management: pain level controlled Vital Signs Assessment: post-procedure vital signs reviewed and stable Respiratory status: spontaneous breathing, nonlabored ventilation, respiratory function stable and patient connected to nasal cannula oxygen Cardiovascular status: blood pressure returned to baseline and stable Postop Assessment: no signs of nausea or vomiting Anesthetic complications: no    Last Vitals:  Vitals:   05/14/16 1715 05/14/16 1728  BP: 128/67   Pulse: 80 75  Resp: (!) 26 19  Temp:  36.7 C    Last Pain:  Vitals:   05/14/16 1715  TempSrc:   PainSc: 5                  Verdia Bolt,W. EDMOND

## 2016-05-15 LAB — CBC WITH DIFFERENTIAL/PLATELET
BASOS ABS: 0 10*3/uL (ref 0.0–0.1)
BASOS PCT: 0 %
EOS PCT: 0 %
Eosinophils Absolute: 0 10*3/uL (ref 0.0–0.7)
HCT: 38.6 % (ref 36.0–46.0)
Hemoglobin: 13.5 g/dL (ref 12.0–15.0)
LYMPHS PCT: 11 %
Lymphs Abs: 0.8 10*3/uL (ref 0.7–4.0)
MCH: 30.9 pg (ref 26.0–34.0)
MCHC: 35 g/dL (ref 30.0–36.0)
MCV: 88.3 fL (ref 78.0–100.0)
MONO ABS: 0.4 10*3/uL (ref 0.1–1.0)
Monocytes Relative: 5 %
NEUTROS ABS: 6.7 10*3/uL (ref 1.7–7.7)
Neutrophils Relative %: 84 %
PLATELETS: 208 10*3/uL (ref 150–400)
RBC: 4.37 MIL/uL (ref 3.87–5.11)
RDW: 13 % (ref 11.5–15.5)
WBC: 7.9 10*3/uL (ref 4.0–10.5)

## 2016-05-15 MED ORDER — MORPHINE SULFATE (PF) 4 MG/ML IV SOLN
2.0000 mg | INTRAVENOUS | Status: DC | PRN
  Administered 2016-05-15: 4 mg via INTRAVENOUS
  Filled 2016-05-15: qty 1

## 2016-05-15 NOTE — Plan of Care (Signed)
Problem: Food- and Nutrition-Related Knowledge Deficit (NB-1.1) Goal: Nutrition education Formal process to instruct or train a patient/client in a skill or to impart knowledge to help patients/clients voluntarily manage or modify food choices and eating behavior to maintain or improve health. Outcome: Completed/Met Date Met: 05/15/16 Nutrition Education Note  Received consult for diet education per DROP protocol.   Discussed 2 week post op diet with pt. Emphasized that liquids must be non carbonated, non caffeinated, and sugar free. Fluid goals discussed. Reviewed progression of diet to include soft proteins at 7-10 days post-op. Pt to follow up with outpatient bariatric RD for further diet progression after 2 weeks. Multivitamins and minerals also reviewed. Teach back method used, pt expressed understanding, expect good compliance.   Diet: First 2 Weeks  You will see the dietitian about two (2) weeks after your surgery. The dietitian will increase the types of foods you can eat if you are handling liquids well:  If you have severe vomiting or nausea and cannot handle clear liquids lasting longer than 1 day, call your surgeon  Protein Shake  Drink at least 2 ounces of shake 5-6 times per day  Each serving of protein shakes (usually 8 - 12 ounces) should have a minimum of:  15 grams of protein  And no more than 5 grams of carbohydrate  Goal for protein each day:  Men = 80 grams per day  Women = 60 grams per day  Protein powder may be added to fluids such as non-fat milk or Lactaid milk or Soy milk (limit to 35 grams added protein powder per serving)   Hydration  Slowly increase the amount of water and other clear liquids as tolerated (See Acceptable Fluids)  Slowly increase the amount of protein shake as tolerated  Sip fluids slowly and throughout the day  May use sugar substitutes in small amounts (no more than 6 - 8 packets per day; i.e. Splenda)   Fluid Goal  The first goal is to  drink at least 8 ounces of protein shake/drink per day (or as directed by the nutritionist); some examples of protein shakes are Syntrax Nectar, Adkins Advantage, EAS Edge HP, and Unjury. See handout from pre-op Bariatric Education Class:  Slowly increase the amount of protein shake you drink as tolerated  You may find it easier to slowly sip shakes throughout the day  It is important to get your proteins in first  Your fluid goal is to drink 64 - 100 ounces of fluid daily  It may take a few weeks to build up to this  32 oz (or more) should be clear liquids  And  32 oz (or more) should be full liquids (see below for examples)  Liquids should not contain sugar, caffeine, or carbonation   Clear Liquids:  Water or Sugar-free flavored water (i.e. Fruit H2O, Propel)  Decaffeinated coffee or tea (sugar-free)  Crystal Lite, Wyler's Lite, Minute Maid Lite  Sugar-free Jell-O  Bouillon or broth  Sugar-free Popsicle: *Less than 20 calories each; Limit 1 per day   Full Liquids:  Protein Shakes/Drinks + 2 choices per day of other full liquids  Full liquids must be:  No More Than 12 grams of Carbs per serving  No More Than 3 grams of Fat per serving  Strained low-fat cream soup  Non-Fat milk  Fat-free Lactaid Milk  Sugar-free yogurt (Dannon Lite & Fit, Greek yogurt)     Christen Bedoya, MS, RD, LDN Pager: [phone] After Hours Pager: [phone]    

## 2016-05-15 NOTE — Consult Note (Signed)
[facility] CM Inpatient Consult   05/15/2016  Marilyn Martin [birth year] [id number]   Late entry for 05/15/16 at 1100am  Went to bedside earlier today to discuss Link to [facility] Care Management program for [facility] Health employees/dependents with [facility] insurance. Provided Link to [employer], contact information, and 24-hr nurse line magnet. Made  Mrs. Druck aware that she will receive post hospital discharge call. Appreciative of visit.   Raiford Noble, MSN-Ed, RN,BSN [facility] Liaison [phone]

## 2016-05-15 NOTE — Discharge Summary (Signed)
Physician Discharge Summary  Patient ID: Marilyn Martin MRN: [id number] DOB/AGE: [birth year] 55 y.o.  Admit date: 05/14/2016 Discharge date: 05/15/2016  Admission Diagnoses:  Morbid obesity  Discharge Diagnoses:  same  Active Problems:   S/P laparoscopic sleeve gastrectomy Dec 2017   Morbid obesity [facility])   Surgery:  Sleeve gastrectomy   Discharged Condition: improved  Hospital Course:   Had surgery on Monday-no hiatal hernia seen.  Begun on liquids and ready for discharge on Tuesday.  Pain controlled  Consults: none  Significant Diagnostic Studies: none    Discharge Exam: Blood pressure 129/69, pulse (!) 53, temperature 98.2 F (36.8 C), temperature source Oral, resp. rate 15, height 5\' 5"  (1.651 m), weight 117.4 kg (258 lb 12.8 oz), SpO2 98 %. Incisions OK  Disposition: 01-Home or Self Care  Discharge Instructions    AMB Referral to [facility] Care Management    Complete by:  As directed    Please assign UMR member for post discharge call. Currently at [facility]. Provided packet. Please call with questions. Raiford Noble, MSN-Ed, RN,BSN-[facility] Liaison-[phone]   Reason for consult:  Please assign UMR member for post discharge call   Expected date of contact:  1-3 days (reserved for hospital discharges)   Ambulate hourly while awake    Complete by:  As directed    Call MD for:  difficulty breathing, headache or visual disturbances    Complete by:  As directed    Call MD for:  persistant dizziness or light-headedness    Complete by:  As directed    Call MD for:  persistant nausea and vomiting    Complete by:  As directed    Call MD for:  redness, tenderness, or signs of infection (pain, swelling, redness, odor or green/yellow discharge around incision site)    Complete by:  As directed    Call MD for:  severe uncontrolled pain    Complete by:  As directed    Call MD for:  temperature >101 F    Complete by:  As directed    Diet  bariatric full liquid    Complete by:  As directed    Incentive spirometry    Complete by:  As directed    Perform hourly while awake       Medication List    TAKE these medications   acetaminophen 500 MG tablet Commonly known as:  TYLENOL Take 1,000 mg by mouth every 6 (six) hours as needed for headache.   BIOTIN PO Take 1 tablet by mouth daily.   Calcium 500 MG Chew Chew 1,500 mg by mouth daily.   furosemide 20 MG tablet Commonly known as:  LASIX Take 1 tablet (20 mg total) by mouth daily. What changed:  when to take this  additional instructions   KLOR-CON M10 10 MEQ tablet Generic drug:  potassium chloride Take 10 mEq by mouth 3 (three) times a week. Take on days with Torsemide   loratadine 10 MG tablet Commonly known as:  CLARITIN Take 10 mg by mouth every morning.   nystatin powder Commonly known as:  MYCOSTATIN/NYSTOP Apply topically 4 (four) times daily.   pantoprazole 40 MG tablet Commonly known as:  PROTONIX Take 1 tablet (40 mg total) by mouth daily. What changed:  when to take this   raloxifene 60 MG tablet Commonly known as:  EVISTA Take 60 mg by mouth every morning.   torsemide 20 MG tablet Commonly known as:  DEMADEX Take 10 tablets  by mouth 3 (three) times a week.   valsartan-hydrochlorothiazide 160-25 MG tablet Commonly known as:  DIOVAN-HCT Take 1 tablet by mouth every morning.      Follow-up Information    Valarie Merino, MD Follow up on 05/31/2016.   Specialty:  General Surgery Why:  Post op follow up appointment at 11:15am  Contact information: [address] [place] [address] [phone]        Valarie Merino, MD Follow up.   Specialty:  General Surgery Contact information: [address] [place] [address] [phone]           Signed: Valarie Merino 05/15/2016, 4:11 PM

## 2016-05-15 NOTE — Progress Notes (Signed)
Assessment unchanged.  All questions pertaining to D/C we answered.  Pt was D/C'd via wheelchair and accompanied by NT.  Marilyn Martin

## 2016-05-15 NOTE — Progress Notes (Signed)
Patient alert and oriented, pain is controlled. Patient is tolerating fluids, advanced to protein shake today, patient is tolerating well.  Reviewed Gastric sleeve discharge instructions with patient and patient is able to articulate understanding.  Provided information on BELT program, Support Group and [facility] outpatient pharmacy. All questions answered, will continue to monitor.  

## 2016-05-17 ENCOUNTER — Encounter: Payer: Self-pay | Admitting: *Deleted

## 2016-05-17 ENCOUNTER — Other Ambulatory Visit: Payer: Self-pay | Admitting: *Deleted

## 2016-05-17 NOTE — Patient Outreach (Addendum)
Transition of care call successful, follow up on referral received from [facility]  For transition of care. Recent hospitalization  12/11-12/12 for  laparoscopic sleeve gastrectomy.  Spoke with UMR member, HIPAA/identity verified, discussed purpose of call, follow up post discharge.  Pt reports today not a good day, not able to sleep at night, did not sleep well in the hospital.  Pt reports yesterday was in a lot of pain, stomach spasms, Tylenol helped.  Pt reports today not in pain, stomach better.  Pt reports tolerating full liquid diet, getting in all of her proteins, close with her water.  Pt reports spouse very supportive, helping.  Pt reports has a hx of hypertension, on Valsartan, checked her BP this am, was low 85/43 so only took 1/2 tablet, increased her fluids, recheck- BP went up to 110/68.  Pt reports she did not take  Lasix,Torsemide, Potassium today either because of low BP reading, to continue to monitor BP.  Pt reports to follow up with Dr. Hassell Done (surgeon) 12/28 plus told to call Primary Care MD 3 weeks after surgery.   RN CM discussed with UMR  Member calling MD if continue not to sleep to which she said she would.  Also discussed  Link to Wellness program (Hypertension) to which pt reports able to manage, no further case management needs.  Pt reports was provided  24 hour nurse line magnet.     Plan:  As discussed with UMR member, plan to send successful outreach letter, close case.    Zara Chess.   [facility] Care Management  [phone]   Addendum: 12/22- episode resolved.    Zara Chess.   [facility] Care Management  [phone]

## 2016-05-29 ENCOUNTER — Encounter: Payer: 59 | Attending: Surgery | Admitting: Skilled Nursing Facility1

## 2016-05-29 ENCOUNTER — Encounter: Payer: Self-pay | Admitting: Skilled Nursing Facility1

## 2016-05-29 DIAGNOSIS — Z713 Dietary counseling and surveillance: Secondary | ICD-10-CM | POA: Insufficient documentation

## 2016-05-29 DIAGNOSIS — Z9884 Bariatric surgery status: Secondary | ICD-10-CM

## 2016-05-29 NOTE — Progress Notes (Signed)
  Bariatric Class:  Appt start time: 1530 end time:  1630.  2 Week Post-Operative Nutrition Class  Patient was seen on 05/29/2016 for Post-Operative [facility]   Surgery date: 05/14/2016 Surgery type: Sleeve gastrectomy Start weight at [facility]: 276 lbs on 01/27/2016 Weight today: 242.8 lbs Wt change (05/29/2016 from pre-op): 25.4  TANITA  BODY COMP RESULTS  04/09/16 05/29/2016   BMI (kg/m^2) 44.6 39.2   Fat Mass (lbs) 145.4 121   Fat Free Mass (lbs) 122.8 121.8   Total Body Water (lbs) 90.6 88.8    The following the learning objectives were met by the patient during this course:  Identifies Phase 3A (Soft, High Proteins) Dietary Goals and will begin from 2 weeks post-operatively to 2 months post-operatively  Identifies appropriate sources of fluids and proteins   States protein recommendations and appropriate sources post-operatively  Identifies the need for appropriate texture modifications, mastication, and bite sizes when consuming solids  Identifies appropriate multivitamin and calcium sources post-operatively  Describes the need for physical activity post-operatively and will follow MD recommendations  States when to call healthcare provider regarding medication questions or post-operative complications  Handouts given during class include:  Phase 3A: Soft, High Protein Diet Handout  Follow-Up Plan: Patient will follow-up at [facility] in 6 weeks for 2 month post-op nutrition visit for diet advancement per MD.

## 2016-05-30 DIAGNOSIS — G4733 Obstructive sleep apnea (adult) (pediatric): Secondary | ICD-10-CM | POA: Diagnosis not present

## 2016-05-31 DIAGNOSIS — I272 Pulmonary hypertension, unspecified: Secondary | ICD-10-CM | POA: Diagnosis not present

## 2016-05-31 DIAGNOSIS — Z299 Encounter for prophylactic measures, unspecified: Secondary | ICD-10-CM | POA: Diagnosis not present

## 2016-05-31 DIAGNOSIS — I10 Essential (primary) hypertension: Secondary | ICD-10-CM | POA: Diagnosis not present

## 2016-05-31 MED FILL — NYAMYC 100,000 UNITS/GM PWD: 100000 | 15 days supply | Qty: 15 | Fill #0

## 2016-06-04 MED FILL — FUROSEMIDE 20 MG TABLET: 20 | 90 days supply | Qty: 90 | Fill #2

## 2016-06-05 ENCOUNTER — Telehealth (HOSPITAL_COMMUNITY): Payer: Self-pay

## 2016-06-05 NOTE — Telephone Encounter (Signed)
Made discharge phone call to patient asking the following questions.    1. Do you have someone to care for you now that you are home?  Yes 2. Are you having pain now that is not relieved by your pain medication?  No 3. Are you able to drink the recommended daily amount of fluids (48 ounces minimum/day) and protein (60-80 grams/day) as prescribed by the dietitian or nutritional counselor?  Yes 4. Are you taking the vitamins and minerals as prescribed?  Yes 5. Do you have the "on call" number to contact your surgeon if you have a problem or question?  Yes 6. Are your incisions free of redness, swelling or drainage? (If steri strips, address that these can fall off, shower as tolerated) Yes 7. Have your bowels moved since your surgery?  If not, are you passing gas?  Yes 8. Are you up and walking 3-4 times per day?  Yes 9. Were you provided your discharge medications before your surgery or before you were discharged from the hospital and are you taking them without problem?  Yes  Patient very concerned that she has gained 2lbs in three days.  Patient stated that she recently stopped her diuretic per her primary care physician. Patient advised to monitor for signs of retention and to check BP daily.  Patient also advised to call with any further questions or concerns.    Efrata Brunner RN

## 2016-06-22 DIAGNOSIS — R87610 Atypical squamous cells of undetermined significance on cytologic smear of cervix (ASC-US): Secondary | ICD-10-CM | POA: Diagnosis not present

## 2016-06-29 ENCOUNTER — Other Ambulatory Visit: Payer: Self-pay | Admitting: Urology

## 2016-06-29 DIAGNOSIS — I10 Essential (primary) hypertension: Secondary | ICD-10-CM | POA: Diagnosis not present

## 2016-06-29 DIAGNOSIS — Z299 Encounter for prophylactic measures, unspecified: Secondary | ICD-10-CM | POA: Diagnosis not present

## 2016-06-29 DIAGNOSIS — N200 Calculus of kidney: Secondary | ICD-10-CM

## 2016-06-30 DIAGNOSIS — G4733 Obstructive sleep apnea (adult) (pediatric): Secondary | ICD-10-CM | POA: Diagnosis not present

## 2016-07-09 ENCOUNTER — Ambulatory Visit (HOSPITAL_COMMUNITY)
Admission: RE | Admit: 2016-07-09 | Discharge: 2016-07-09 | Disposition: A | Discharge status: Home / Self Care | Payer: 59 | Source: Ambulatory Visit | Attending: Urology | Admitting: Urology

## 2016-07-09 ENCOUNTER — Ambulatory Visit (HOSPITAL_COMMUNITY): Payer: 59

## 2016-07-09 DIAGNOSIS — Z87442 Personal history of urinary calculi: Secondary | ICD-10-CM | POA: Insufficient documentation

## 2016-07-09 DIAGNOSIS — N200 Calculus of kidney: Secondary | ICD-10-CM

## 2016-07-11 ENCOUNTER — Ambulatory Visit (INDEPENDENT_AMBULATORY_CARE_PROVIDER_SITE_OTHER): Payer: 59 | Admitting: Urology

## 2016-07-11 DIAGNOSIS — N200 Calculus of kidney: Secondary | ICD-10-CM | POA: Diagnosis not present

## 2016-07-13 ENCOUNTER — Encounter: Payer: 59 | Attending: Surgery | Admitting: Skilled Nursing Facility1

## 2016-07-13 DIAGNOSIS — Z713 Dietary counseling and surveillance: Secondary | ICD-10-CM | POA: Diagnosis not present

## 2016-07-13 NOTE — Progress Notes (Signed)
Surgery date: 05/14/2016 Surgery type: Sleeve gastrectomy Start weight at [facility]: 276 lbs on 01/27/2016 Weight today: 242.8 lbs Wt change : 14.8  TANITA  BODY COMP RESULTS  04/09/16 05/29/2016 07/13/2016   BMI (kg/m^2) 44.6 39.2 37.9   Fat Mass (lbs) 145.4 121 109.4   Fat Free Mass (lbs) 122.8 121.8 118.6   Total Body Water (lbs) 90.6 88.8 85.8    Follow-up visit:  8 Weeks Post-Operative Sleeve gastrectomy Surgery  Medical Nutrition Therapy:  Appt start time:9:00 end time:  9:33  Primary concerns today: Post-operative Bariatric Surgery Nutrition Management. Doctor told her only 2 calcium. Pt states she will go to [facility] starting march first. Pt states she is severely constipated.   Pt states he no longer takes valsartan.   Preferred Learning Style:   No preference indicated   Learning Readiness:  Change in progress  24-hr recall: B (AM): yogurt 15 grams protein or protein shake 30 grams Snk (AM): [place] deli meat 3 grams protein L (PM): baked chicken or deli meat 3 ounces 21 grams protein Snk (PM):   D (PM): wendy's chili 10 grams or burger patty with cheese 21 grams  Snk (PM): sugar free popcicle  Fluid intake: crystal light water 32 ounces, lemon water, hibiscus tea with stevia, decaf tea and coffee Estimated total protein intake: 60 grams protein   Medications: see list Supplementation: Multi and calciums and biotin   Using straws: NO Drinking while eating: NO Hair loss: NO: minimal Carbonated beverages: NO N/V/D/C: NO, NO, NO, YES:   Recent physical activity:  Walking in the mall a couple days a week  Progress Towards Goal(s):  In progress.  Handouts given during visit include:  NS veggies + protien   Nutritional Diagnosis:  Toombs-3.3 Overweight/obesity related to past poor dietary habits and physical inactivity as evidenced by patient w/ recent sleeve gastrectomy surgery following dietary guidelines for continued weight loss.    Intervention:  Nutrition  counseling.Dietitian educated the pt on advancing her diet to include non starchy vegetables Goals: Try to add more beans to your diet Try Kefir: in the yogurt aisle   Teaching Method Utilized:  Visual Auditory Hands on  Barriers to learning/adherence to lifestyle change: none identified  Demonstrated degree of understanding via:  Teach Back   Monitoring/Evaluation:  Dietary intake, exercise, lap band fills, and body weight.

## 2016-07-13 NOTE — Patient Instructions (Addendum)
Try to add more beans to your diet  Try Kefir: in the yogurt aisle

## 2016-07-27 ENCOUNTER — Ambulatory Visit (INDEPENDENT_AMBULATORY_CARE_PROVIDER_SITE_OTHER): Payer: 59 | Admitting: Internal Medicine

## 2016-07-27 ENCOUNTER — Encounter: Payer: Self-pay | Admitting: Internal Medicine

## 2016-07-27 VITALS — BP 114/76 | HR 73 | Ht 65.5 in | Wt 227.0 lb

## 2016-07-27 DIAGNOSIS — G4733 Obstructive sleep apnea (adult) (pediatric): Secondary | ICD-10-CM | POA: Diagnosis not present

## 2016-07-27 NOTE — Patient Instructions (Signed)
Order- Schedule unattended home sleep test   Dx OSA   We will see if you still need CPAP. If so, we can adjust pressures and refit mask to make it more comfortable while you continue your weight loss.  Please call as needed

## 2016-07-27 NOTE — Assessment & Plan Note (Signed)
Weight is coming down. She will set goals with her bariatric surgeon

## 2016-07-27 NOTE — Assessment & Plan Note (Addendum)
She doesn't recognize benefit now after significant weight loss, if she wears CPAP. We can verify that with an unattended Home Sleep Test. If she can maintain or improve on her current weight loss, she may no longer need CPAP. Plan schedule home sleep test

## 2016-07-27 NOTE — Progress Notes (Signed)
HPI female former smoker followed for OSA, complicated by GERD, HBP, morbid obesity/bariatric surgery, kidney stones NPSG 04/06/2016- AHI 17.4/hour, oxygen desaturation to 85%, body weight 271 pounds Gastric sleeve bariatric surgery December 2017  ---------------------------------------------------------------------------------------------------------------------  04/20/2016-56 year old female former smoker referred courtesy of Dr Hassell Done; pt is due for gastric bypass 05-14-16;needs clearance. Pt recently had sleep study-attached.  NPSG 04/06/2016- AHI 17.4/hour, oxygen desaturation to 85%, body weight 271 pounds Her husband has told her that she snores and she occasionally wakes her self snoring. At times feels unrested with some daytime sleepiness. Occasional caffeine but not a heavy user. Bedtime 9 or 9:30 PM, 20-30 minutes sleep latency, waking twice before up at 5 AM. ENT surgery-tonsils. Some dyspnea on exertion occasionally which she attributes to being overweight but no history of specific lung disease. Had cardiac workup in the past determining that she did not have pulmonary hypertension. Sleep study results reviewed with her. CXR 01/13/2016 IMPRESSION: No active cardiopulmonary disease.  07/27/2016-56 year old female former smoker followed for OSA, complicated by GERD, HBP, morbid obesity/bariatric surgery, kidney stones CPAP auto 5-15 /[facility] FOLLOWS FOR:DME: [facility]-pt brought CPAP machine and no SD card in machine-not in [place] as well. Pt states she wears CPAP about 2-3 nights a week; issues with pressure/mask,etc.  Weight 04/20/2016 was 269 pounds, now 227 pounds after bariatric surgery  Successful gastric sleeve bariatric surgery December 2017. She would like to lose 60 more pounds. Not wearing CPAP regularly and says she can't tell a difference when she wears it. Husband tells her she is not snoring without it now. She refers to CPAP as "the beast". No longer sleepiness  watching TV.  ROS-see HPI   Negative unless "+" Constitutional:    weight loss, night sweats, fevers, chills, fatigue, lassitude. HEENT:    +headaches, difficulty swallowing, tooth/dental problems, sore throat,       sneezing, itching, ear ache, nasal congestion, post nasal drip, snoring CV:    chest pain, orthopnea, PND, swelling in lower extremities, anasarca,                                                   dizziness, palpitations Resp:   shortness of breath with exertion or at rest.                productive cough,   non-productive cough, coughing up of blood.              change in color of mucus.  wheezing.   Skin:    rash or lesions. GI:    +heartburn, indigestion, abdominal pain, nausea, vomiting, diarrhea,                 change in bowel habits, loss of appetite GU: dysuria, change in color of urine, no urgency or frequency.   flank pain. MS:   joint pain, stiffness, decreased range of motion, back pain. Neuro-     nothing unusual Psych:  change in mood or affect.  depression or anxiety.   memory loss.  OBJ- Physical Exam General- Alert, Oriented, Affect-appropriate, Distress- none acute, + Obese, but better Skin- rash-none, lesions- none, excoriation- none Lymphadenopathy- none Head- atraumatic            Eyes- Gross vision intact, PERRLA, conjunctivae and secretions clear            Ears- Hearing, canals-normal  Nose- Clear, no-Septal dev, mucus, polyps, erosion, perforation             Throat- Mallampati II-III , mucosa clear , drainage- none, tonsils- atrophic Neck- flexible , trachea midline, no stridor , thyroid nl, carotid no bruit Chest - symmetrical excursion , unlabored           Heart/CV- RRR , no murmur , no gallop  , no rub, nl s1 s2                           - JVD- none , edema- none, stasis changes- none, varices- none           Lung- clear to P&A, wheeze- none, cough- none , dullness-none, rub- none           Chest wall-  Abd-  Br/ Gen/ Rectal-  Not done, not indicated Extrem- cyanosis- none, clubbing, none, atrophy- none, strength- nl Neuro- grossly intact to observation

## 2016-08-09 ENCOUNTER — Encounter: Payer: Self-pay | Admitting: Internal Medicine

## 2016-08-09 NOTE — Telephone Encounter (Signed)
Please see pt's email.   Pt is requesting to hold off on sleep study and re-evaluate CPAP DL upon return visit.   Dr. Annamaria Boots please advise if this is ok. Thanks.

## 2016-08-09 NOTE — Telephone Encounter (Signed)
Ok to cancel planned HST. Please make sure she has a specific ov return sometime. We will review the CPAP download then and see how she is doing.

## 2016-08-28 DIAGNOSIS — G4733 Obstructive sleep apnea (adult) (pediatric): Secondary | ICD-10-CM | POA: Diagnosis not present

## 2016-09-17 MED FILL — TORSEMIDE 20 MG TABLET: 20 | 90 days supply | Qty: 40 | Fill #2

## 2016-09-21 MED FILL — DENTA 5000 PLUS CREAM: 1.1 | 30 days supply | Qty: 51 | Fill #0

## 2016-09-28 DIAGNOSIS — G4733 Obstructive sleep apnea (adult) (pediatric): Secondary | ICD-10-CM | POA: Diagnosis not present

## 2016-10-17 ENCOUNTER — Ambulatory Visit: Payer: 59 | Admitting: Skilled Nursing Facility1

## 2016-10-18 DIAGNOSIS — Z6837 Body mass index (BMI) 37.0-37.9, adult: Secondary | ICD-10-CM | POA: Diagnosis not present

## 2016-10-18 DIAGNOSIS — I272 Pulmonary hypertension, unspecified: Secondary | ICD-10-CM | POA: Diagnosis not present

## 2016-10-18 DIAGNOSIS — Z713 Dietary counseling and surveillance: Secondary | ICD-10-CM | POA: Diagnosis not present

## 2016-10-18 DIAGNOSIS — Z299 Encounter for prophylactic measures, unspecified: Secondary | ICD-10-CM | POA: Diagnosis not present

## 2016-10-18 DIAGNOSIS — L259 Unspecified contact dermatitis, unspecified cause: Secondary | ICD-10-CM | POA: Diagnosis not present

## 2016-10-28 DIAGNOSIS — G4733 Obstructive sleep apnea (adult) (pediatric): Secondary | ICD-10-CM | POA: Diagnosis not present

## 2016-11-12 ENCOUNTER — Telehealth (INDEPENDENT_AMBULATORY_CARE_PROVIDER_SITE_OTHER): Payer: Self-pay | Admitting: Internal Medicine

## 2016-11-12 DIAGNOSIS — K6289 Other specified diseases of anus and rectum: Secondary | ICD-10-CM

## 2016-11-12 MED ORDER — HYDROCORTISONE ACE-PRAMOXINE 1-1 % RE FOAM: 1.0000 | Freq: Two times a day (BID) | RECTAL | 2 refills | Status: DC

## 2016-11-12 MED FILL — PROCTOFOAM-HC 1%-1% FOAM: 1-1 | 14 days supply | Qty: 10 | Fill #0

## 2016-11-12 NOTE — Telephone Encounter (Signed)
error 

## 2016-11-20 NOTE — Telephone Encounter (Signed)
err

## 2016-11-22 DIAGNOSIS — Z9884 Bariatric surgery status: Secondary | ICD-10-CM | POA: Diagnosis not present

## 2016-11-28 DIAGNOSIS — Z9884 Bariatric surgery status: Secondary | ICD-10-CM | POA: Diagnosis not present

## 2016-12-07 ENCOUNTER — Ambulatory Visit: Payer: 59 | Admitting: Internal Medicine

## 2016-12-14 DIAGNOSIS — Z1231 Encounter for screening mammogram for malignant neoplasm of breast: Secondary | ICD-10-CM | POA: Diagnosis not present

## 2016-12-14 DIAGNOSIS — Z13820 Encounter for screening for osteoporosis: Secondary | ICD-10-CM | POA: Diagnosis not present

## 2016-12-14 DIAGNOSIS — Z01419 Encounter for gynecological examination (general) (routine) without abnormal findings: Secondary | ICD-10-CM | POA: Diagnosis not present

## 2016-12-14 DIAGNOSIS — Z6835 Body mass index (BMI) 35.0-35.9, adult: Secondary | ICD-10-CM | POA: Diagnosis not present

## 2016-12-19 MED FILL — TORSEMIDE 20 MG TABLET: 20 | 90 days supply | Qty: 40 | Fill #3

## 2017-01-04 DIAGNOSIS — Z87891 Personal history of nicotine dependence: Secondary | ICD-10-CM | POA: Diagnosis not present

## 2017-01-04 DIAGNOSIS — Z79899 Other long term (current) drug therapy: Secondary | ICD-10-CM | POA: Diagnosis not present

## 2017-01-04 DIAGNOSIS — Z1211 Encounter for screening for malignant neoplasm of colon: Secondary | ICD-10-CM | POA: Diagnosis not present

## 2017-01-04 DIAGNOSIS — I10 Essential (primary) hypertension: Secondary | ICD-10-CM | POA: Diagnosis not present

## 2017-01-04 DIAGNOSIS — Z299 Encounter for prophylactic measures, unspecified: Secondary | ICD-10-CM | POA: Diagnosis not present

## 2017-01-04 DIAGNOSIS — Z0000 Encounter for general adult medical examination without abnormal findings: Secondary | ICD-10-CM | POA: Diagnosis not present

## 2017-01-04 DIAGNOSIS — Z1389 Encounter for screening for other disorder: Secondary | ICD-10-CM | POA: Diagnosis not present

## 2017-01-04 DIAGNOSIS — E669 Obesity, unspecified: Secondary | ICD-10-CM | POA: Diagnosis not present

## 2017-01-04 DIAGNOSIS — F419 Anxiety disorder, unspecified: Secondary | ICD-10-CM | POA: Diagnosis not present

## 2017-01-18 MED FILL — RALOXIFENE HCL 60 MG TABLET: 60 | 90 days supply | Qty: 90 | Fill #0

## 2017-03-06 ENCOUNTER — Encounter: Payer: Self-pay | Admitting: Podiatry

## 2017-03-06 ENCOUNTER — Ambulatory Visit (INDEPENDENT_AMBULATORY_CARE_PROVIDER_SITE_OTHER): Payer: 59

## 2017-03-06 ENCOUNTER — Ambulatory Visit (INDEPENDENT_AMBULATORY_CARE_PROVIDER_SITE_OTHER): Payer: 59 | Admitting: Podiatry

## 2017-03-06 DIAGNOSIS — M7662 Achilles tendinitis, left leg: Secondary | ICD-10-CM

## 2017-03-06 DIAGNOSIS — M779 Enthesopathy, unspecified: Secondary | ICD-10-CM

## 2017-03-06 DIAGNOSIS — M7661 Achilles tendinitis, right leg: Secondary | ICD-10-CM

## 2017-03-06 MED ORDER — TRIAMCINOLONE ACETONIDE 10 MG/ML IJ SUSP
10.0000 mg | Freq: Once | INTRAMUSCULAR | Status: AC
  Administered 2017-03-06: 10 mg

## 2017-03-06 NOTE — Patient Instructions (Signed)

## 2017-03-07 NOTE — Progress Notes (Signed)
Subjective:    Patient ID: Marilyn Martin, female   DOB: 56 y.o.   MRN: [id number]   HPI patient states it started to hurt again in the right Achilles    ROS      Objective:  Physical Exam neurovascular status intact with inflammation of the medial side of the right Achilles that's quite sore over the lateral side doing well     Assessment:    Medial Achilles tendinitis right with no indication of pathology central lateral     Plan:    H&P condition reviewed and careful medial injection administered after explaining chances for rupture with 3 mg dexamethasone Kenalog 5 mill grams Xylocaine and patient will be careful with this and be seen back

## 2017-03-08 ENCOUNTER — Ambulatory Visit: Payer: 59 | Admitting: Internal Medicine

## 2017-03-08 DIAGNOSIS — H524 Presbyopia: Secondary | ICD-10-CM | POA: Diagnosis not present

## 2017-03-08 DIAGNOSIS — H5213 Myopia, bilateral: Secondary | ICD-10-CM | POA: Diagnosis not present

## 2017-03-08 DIAGNOSIS — H52223 Regular astigmatism, bilateral: Secondary | ICD-10-CM | POA: Diagnosis not present

## 2017-03-29 IMAGING — US US RENAL
1 series · 14 of 25 positions shown · non-contrast
Comparison: Renal ultrasound December 12, 2015

CLINICAL DATA: History of kidney stones

EXAM:
RENAL / URINARY TRACT ULTRASOUND COMPLETE

[Series 1: us renal · 0.24mm/px · 14 of 47 slices shown]
[im 1/47]
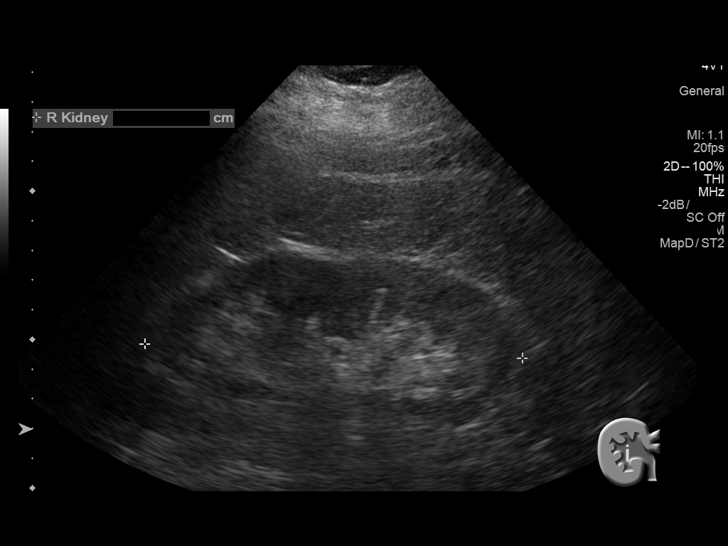
[im 4/47]
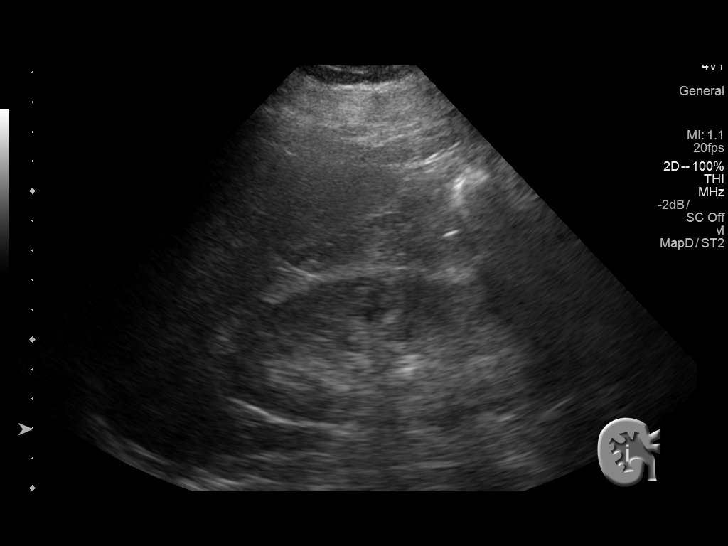
[im 8/47]
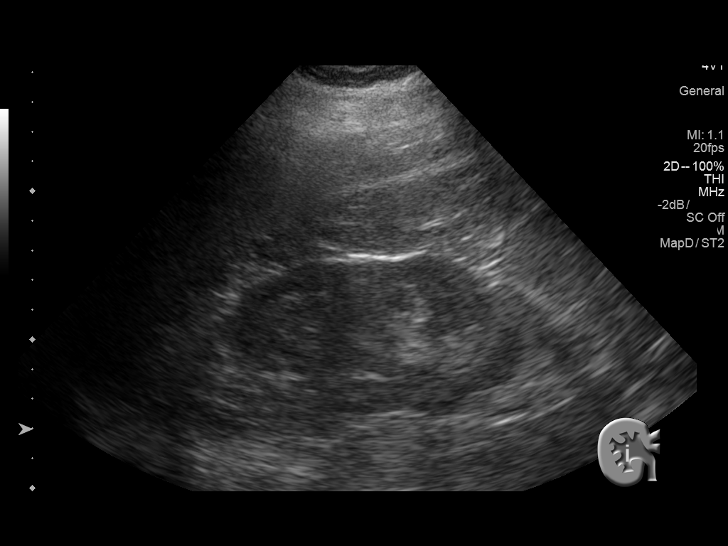
[im 12/47]
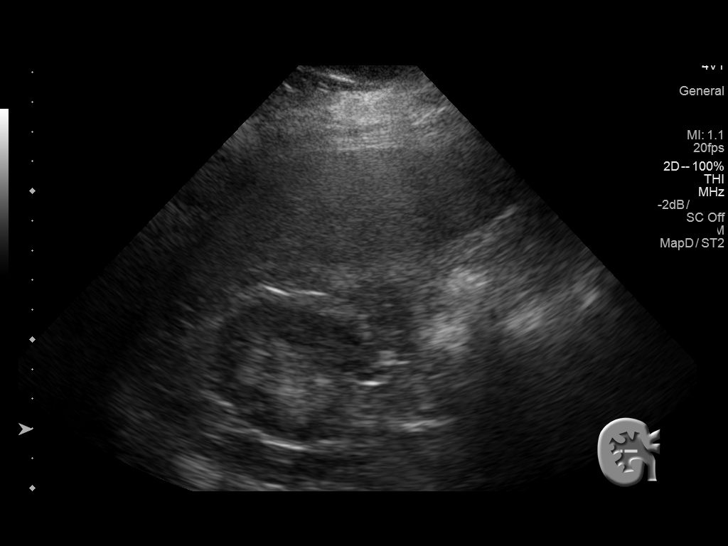
[im 16/47]
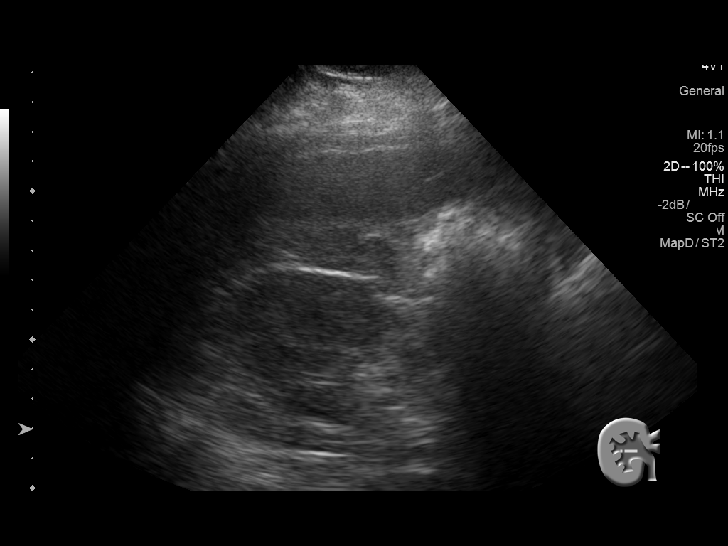
[im 18/47]
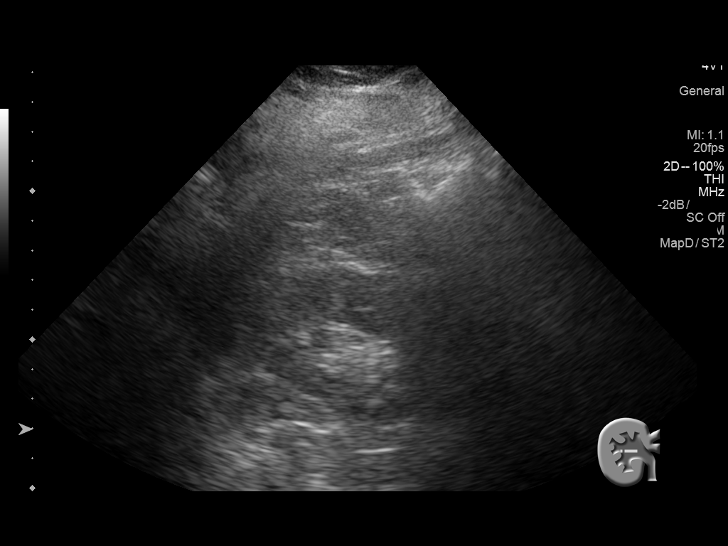
[im 22/47]
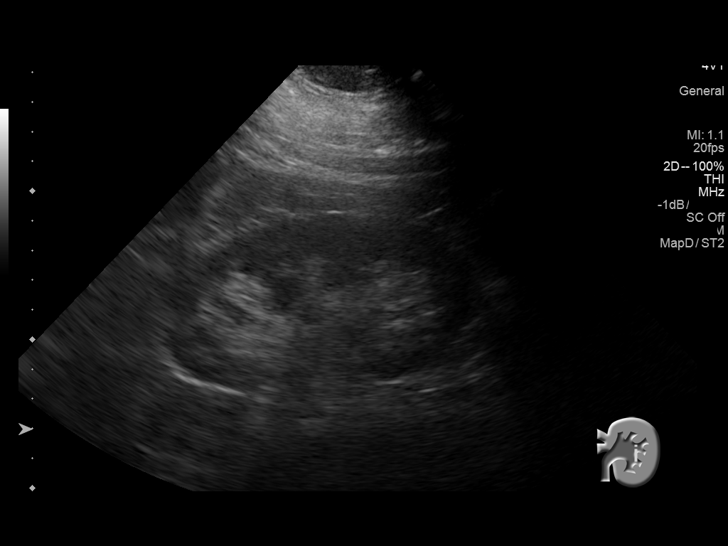
[im 25/47]
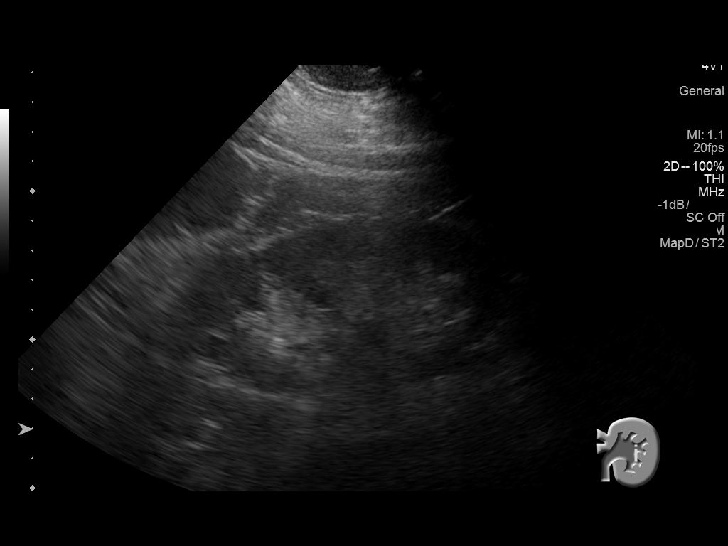
[im 29/47]
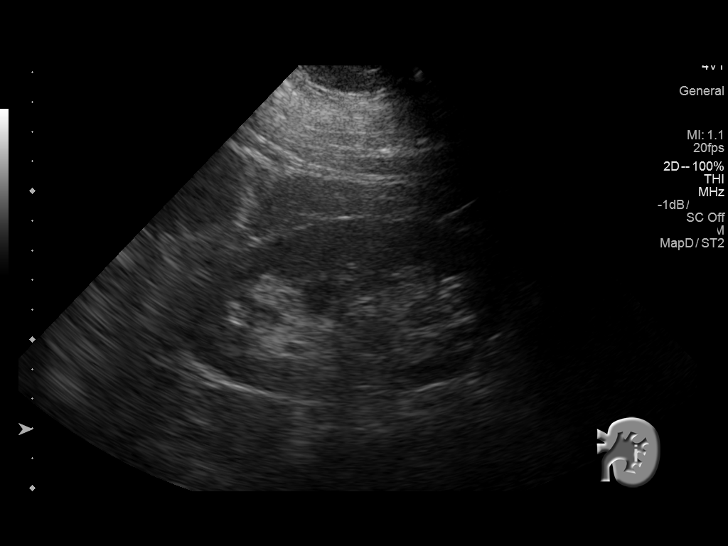
[im 31/47]
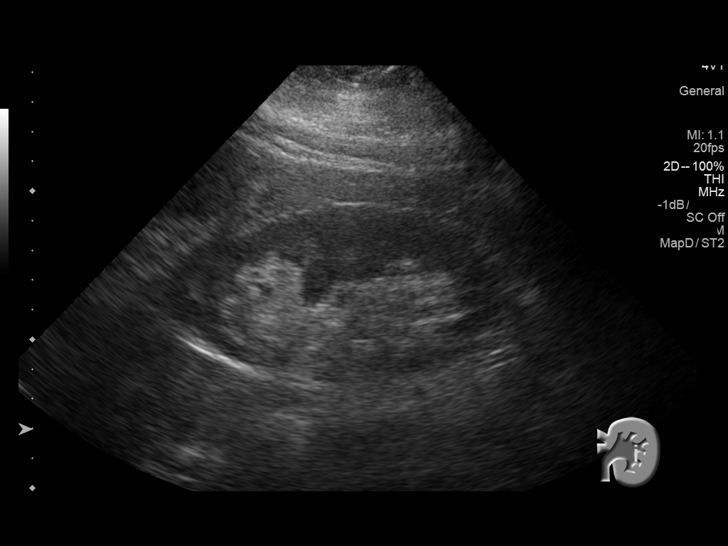
[im 35/47]
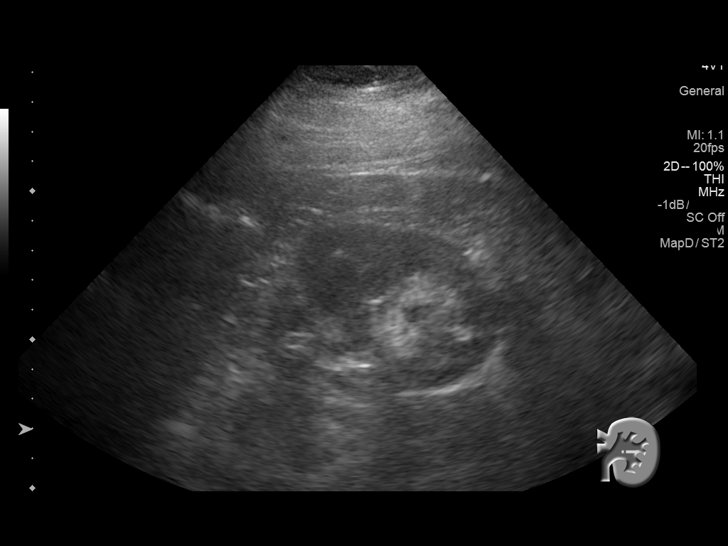
[im 39/47]
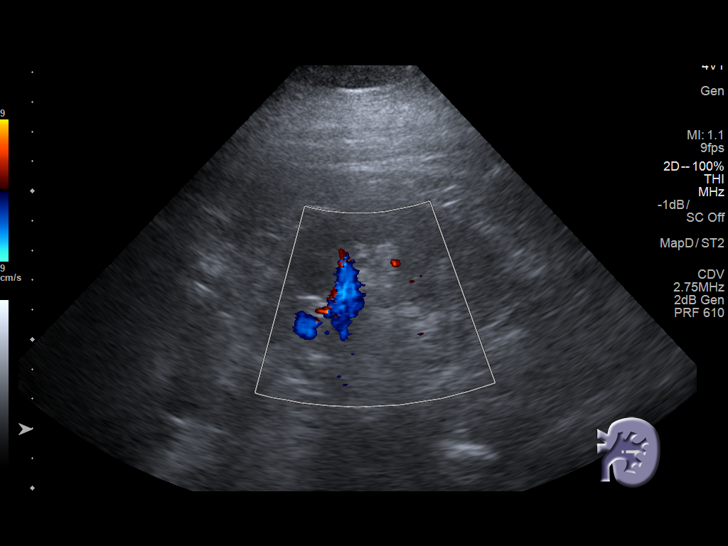
[im 43/47]
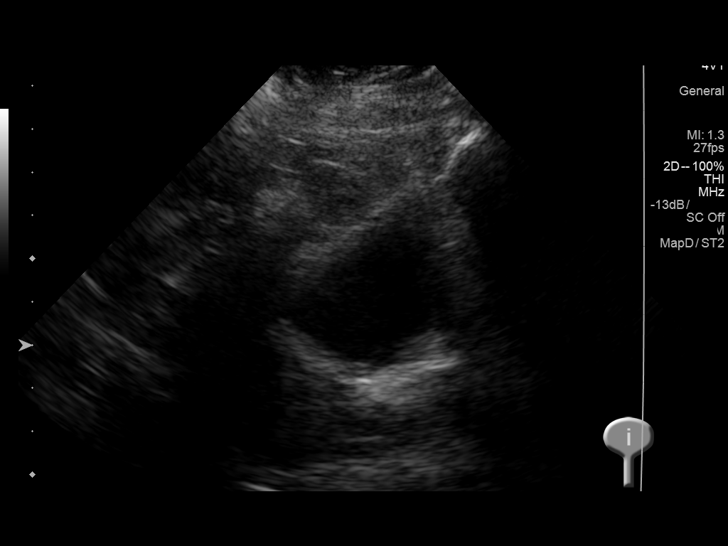
[im 47/47]
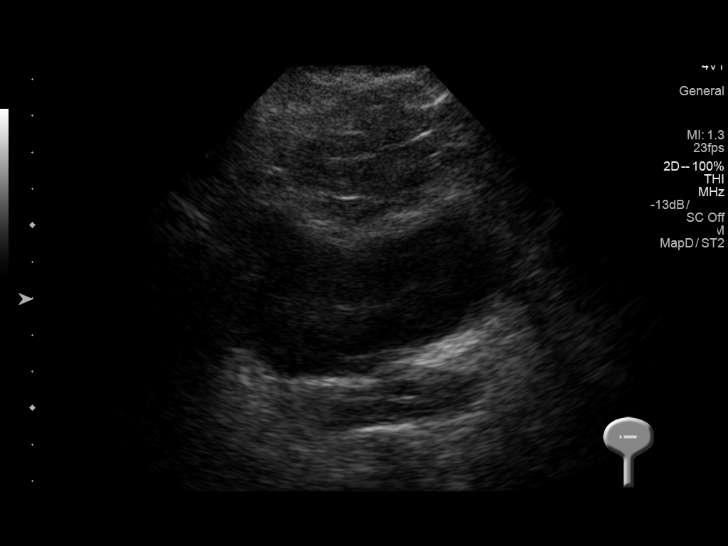

[14 of 25 positions shown; findings below may reference images not displayed]

FINDINGS: Right Kidney:

Length: 12.7 cm. The renal cortical echotexture is normal. There is
no hydronephrosis. No echogenic shadowing stones are observed.

Left Kidney:

Length: 11.2 cm. The cortical echotexture is normal. No
hydronephrosis or discrete stones are observed.

Bladder:

The urinary bladder is only partially distended but is grossly
normal.
IMPRESSION: Normal appearance of both kidneys. The limited visualization of the
bladder is normal as well.

## 2017-06-24 ENCOUNTER — Telehealth (INDEPENDENT_AMBULATORY_CARE_PROVIDER_SITE_OTHER): Payer: Self-pay | Admitting: Internal Medicine

## 2017-06-24 DIAGNOSIS — H6502 Acute serous otitis media, left ear: Secondary | ICD-10-CM

## 2017-06-24 MED ORDER — LEVOFLOXACIN 500 MG PO TABS: 500.0000 mg | ORAL_TABLET | Freq: Every day | ORAL | 1 refills | Status: DC

## 2017-06-24 NOTE — Telephone Encounter (Signed)
Rx for Levaquin sent to her pharmacy

## 2017-06-26 ENCOUNTER — Other Ambulatory Visit: Payer: Self-pay | Admitting: Urology

## 2017-06-26 DIAGNOSIS — N200 Calculus of kidney: Secondary | ICD-10-CM

## 2017-06-27 ENCOUNTER — Ambulatory Visit: Payer: 59 | Admitting: Internal Medicine

## 2017-07-02 ENCOUNTER — Ambulatory Visit (HOSPITAL_COMMUNITY)
Admission: RE | Admit: 2017-07-02 | Discharge: 2017-07-02 | Disposition: A | Discharge status: Home / Self Care | Payer: 59 | Source: Ambulatory Visit | Attending: Urology | Admitting: Urology

## 2017-07-02 DIAGNOSIS — N200 Calculus of kidney: Secondary | ICD-10-CM | POA: Diagnosis not present

## 2017-07-26 ENCOUNTER — Ambulatory Visit (INDEPENDENT_AMBULATORY_CARE_PROVIDER_SITE_OTHER): Payer: 59 | Admitting: Urology

## 2017-07-26 DIAGNOSIS — N200 Calculus of kidney: Secondary | ICD-10-CM

## 2017-09-27 ENCOUNTER — Encounter: Payer: Self-pay | Admitting: Internal Medicine

## 2017-09-27 ENCOUNTER — Ambulatory Visit (INDEPENDENT_AMBULATORY_CARE_PROVIDER_SITE_OTHER): Payer: 59 | Admitting: Internal Medicine

## 2017-09-27 VITALS — BP 114/68 | HR 69 | Ht 65.5 in | Wt 216.2 lb

## 2017-09-27 DIAGNOSIS — G4733 Obstructive sleep apnea (adult) (pediatric): Secondary | ICD-10-CM | POA: Diagnosis not present

## 2017-09-27 NOTE — Patient Instructions (Signed)
Order- DME [facility]   Please refit CPAP mask of choice for better seal and comfort. Suggest try nasal pillows. Continue auto 5-15, mask of choice, humdifiier, supplies, AirView  Please call if we can help

## 2017-09-27 NOTE — Progress Notes (Signed)
HPI female former smoker followed for OSA, complicated by GERD, HBP, morbid obesity/bariatric surgery, kidney stones NPSG 04/06/2016- AHI 17.4/hour, oxygen desaturation to 85%, body weight 271 pounds Gastric sleeve bariatric surgery December 2017  ------------------------------------------------------------------------------------------------------------------- 07/27/2016-57 year old female former smoker followed for OSA, complicated by GERD, HBP, morbid obesity/bariatric surgery, kidney stones CPAP auto 5-15 /[facility] FOLLOWS FOR:DME: [facility]-pt brought CPAP machine and no SD card in machine-not in [place] as well. Pt states she wears CPAP about 2-3 nights a week; issues with pressure/mask,etc.  Weight 04/20/2016 was 269 pounds, now 227 pounds after bariatric surgery  Successful gastric sleeve bariatric surgery December 2017. She would like to lose 60 more pounds. Not wearing CPAP regularly and says she can't tell a difference when she wears it. Husband tells her she is not snoring without it now. She refers to CPAP as "the beast". No longer sleepiness watching TV.  09/27/2017- 57 year old female former smoker followed for OSA, complicated by GERD, HBP, morbid obesity/bariatric surgery, kidney stones CPAP auto 5-15 /[facility] ----OSA: Pt has not been using CPAP as its hard to use due to TMJ and  uses a mouth piece.  She says she tosses and turns and has trouble maintaining mask seal.  ROS-see HPI   Negative unless "+" Constitutional:    weight loss, night sweats, fevers, chills, fatigue, lassitude. HEENT:    +headaches, difficulty swallowing, tooth/dental problems, sore throat,       sneezing, itching, ear ache, nasal congestion, post nasal drip, snoring CV:    chest pain, orthopnea, PND, swelling in lower extremities, anasarca,                                                  dizziness, palpitations Resp:   shortness of breath with exertion or at rest.                productive cough,    non-productive cough, coughing up of blood.              change in color of mucus.  wheezing.   Skin:    rash or lesions. GI:    +heartburn, indigestion, abdominal pain, nausea, vomiting, diarrhea,                 change in bowel habits, loss of appetite GU: dysuria, change in color of urine, no urgency or frequency.   flank pain. MS:   joint pain, stiffness, decreased range of motion, back pain. Neuro-     nothing unusual Psych:  change in mood or affect.  depression or anxiety.   memory loss.  OBJ- Physical Exam General- Alert, Oriented, Affect-appropriate, Distress- none acute, + overweight Skin- rash-none, lesions- none, excoriation- none Lymphadenopathy- none Head- atraumatic            Eyes- Gross vision intact, PERRLA, conjunctivae and secretions clear            Ears- Hearing, canals-normal            Nose- Clear, no-Septal dev, mucus, polyps, erosion, perforation             Throat- Mallampati II-III , mucosa clear , drainage- none, tonsils- atrophic Neck- flexible , trachea midline, no stridor , thyroid nl, carotid no bruit Chest - symmetrical excursion , unlabored           Heart/CV- RRR , no murmur ,  no gallop  , no rub, nl s1 s2                           - JVD- none , edema- none, stasis changes- none, varices- none           Lung- clear to P&A, wheeze- none, cough- none , dullness-none, rub- none           Chest wall-  Abd-  Br/ Gen/ Rectal- Not done, not indicated Extrem- cyanosis- none, clubbing, none, atrophy- none, strength- nl Neuro- grossly intact to observation

## 2017-09-28 NOTE — Assessment & Plan Note (Signed)
She clearly would like not to have to deal with CPAP.  We discussed oral appliances.  Her current device is to prevent bruxism.  It may function to maintain normal mandibular position and dental occlusion, so it may provide some benefit for her OSA.  She sleeps off of her back by preference. Before giving up on CPAP, she is going to discuss mask fit and choices with her DME provider-[facility].

## 2017-10-15 ENCOUNTER — Other Ambulatory Visit (INDEPENDENT_AMBULATORY_CARE_PROVIDER_SITE_OTHER): Payer: Self-pay | Admitting: Internal Medicine

## 2017-10-15 DIAGNOSIS — R609 Edema, unspecified: Secondary | ICD-10-CM

## 2017-10-15 MED ORDER — TORSEMIDE 20 MG PO TABS: 20.0000 mg | ORAL_TABLET | Freq: Every day | ORAL | 3 refills | Status: DC

## 2017-10-15 MED FILL — TORSEMIDE 20 MG TABLET: 20 | 60 days supply | Qty: 90 | Fill #0

## 2017-12-30 DIAGNOSIS — Z6836 Body mass index (BMI) 36.0-36.9, adult: Secondary | ICD-10-CM | POA: Diagnosis not present

## 2017-12-30 DIAGNOSIS — Z01419 Encounter for gynecological examination (general) (routine) without abnormal findings: Secondary | ICD-10-CM | POA: Diagnosis not present

## 2018-03-22 IMAGING — US US RENAL
1 series · 14 of 25 positions shown · non-contrast
Comparison: Renal ultrasound dated 07/09/2016. CT abdomen/pelvis
dated 06/22/2015.

CLINICAL DATA: Follow-up nephrolithiasis

EXAM:
RENAL / URINARY TRACT ULTRASOUND COMPLETE

[Series 1: us renal · 0.25mm/px · 14 of 56 slices shown]
[im 1/56]
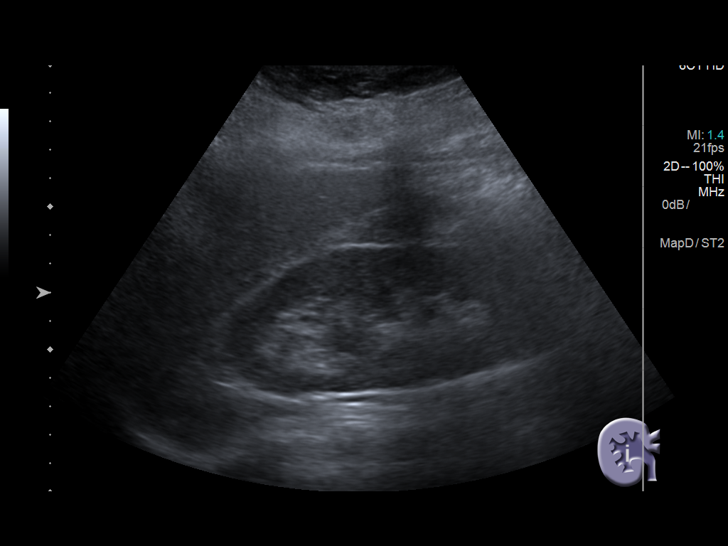
[im 5/56]
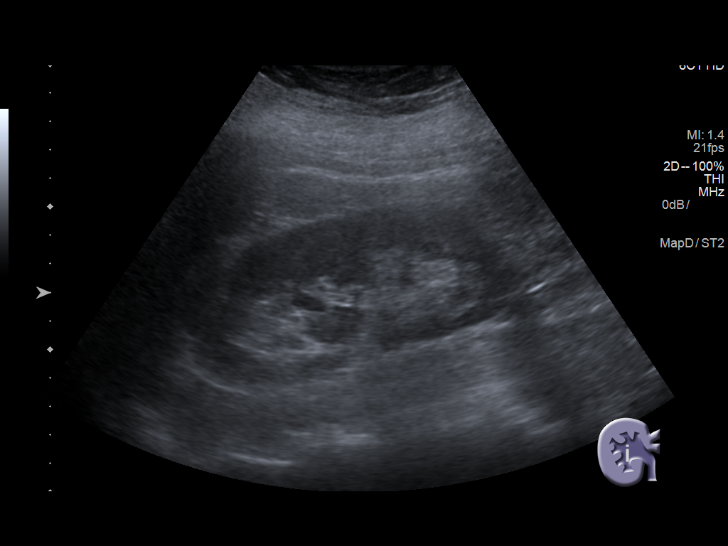
[im 10/56]
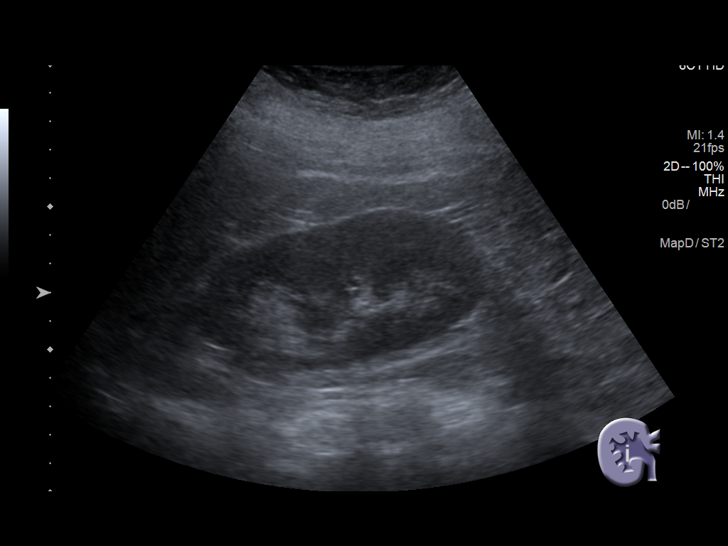
[im 14/56]
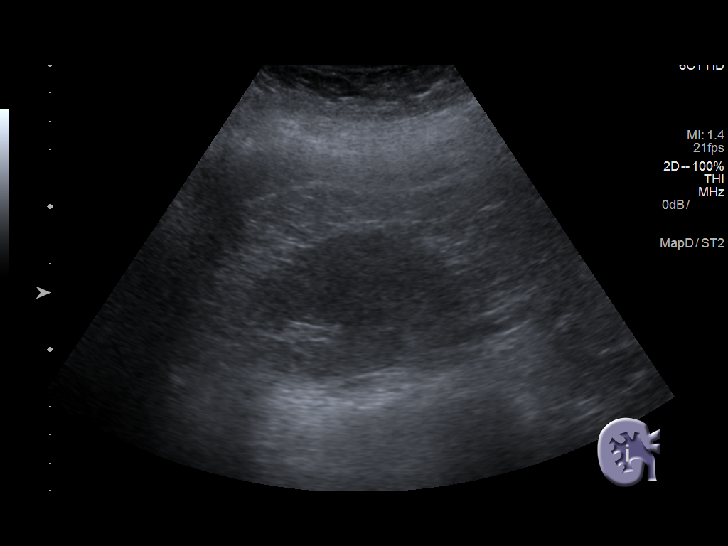
[im 19/56]
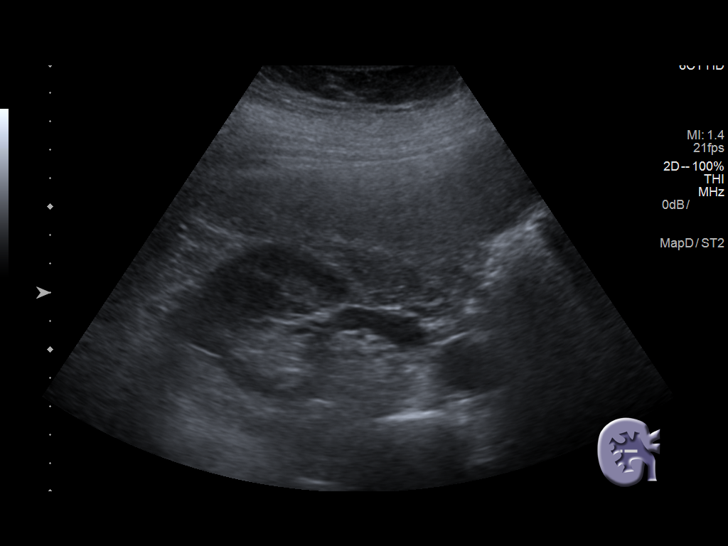
[im 21/56]
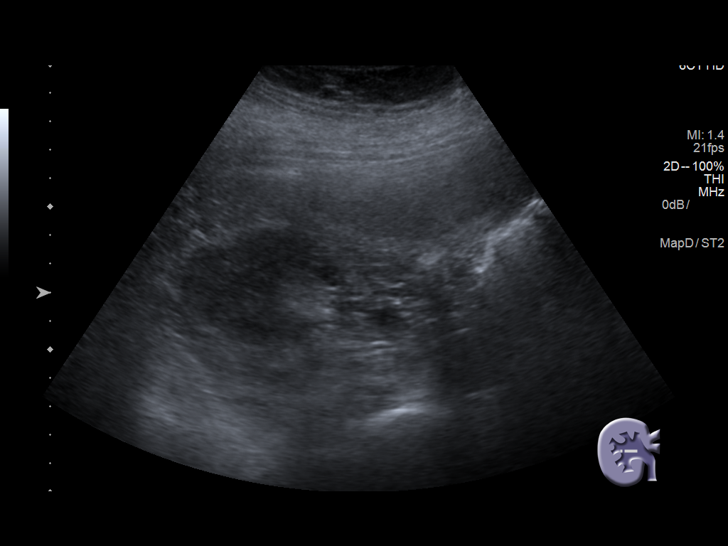
[im 26/56]
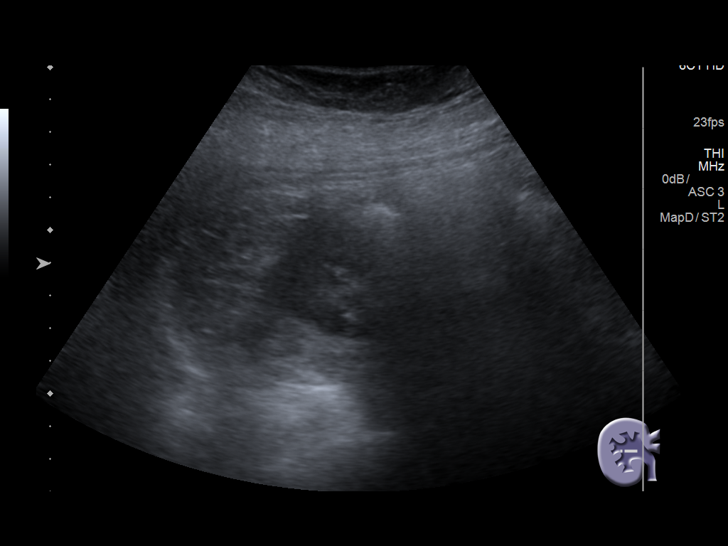
[im 30/56]
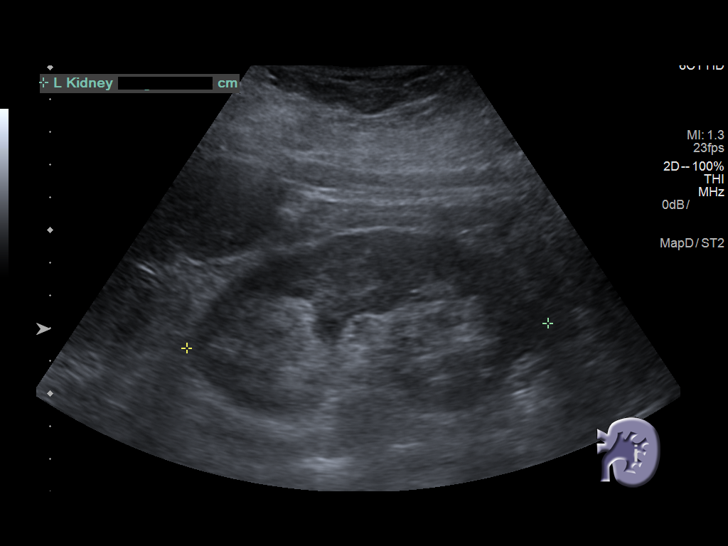
[im 35/56]
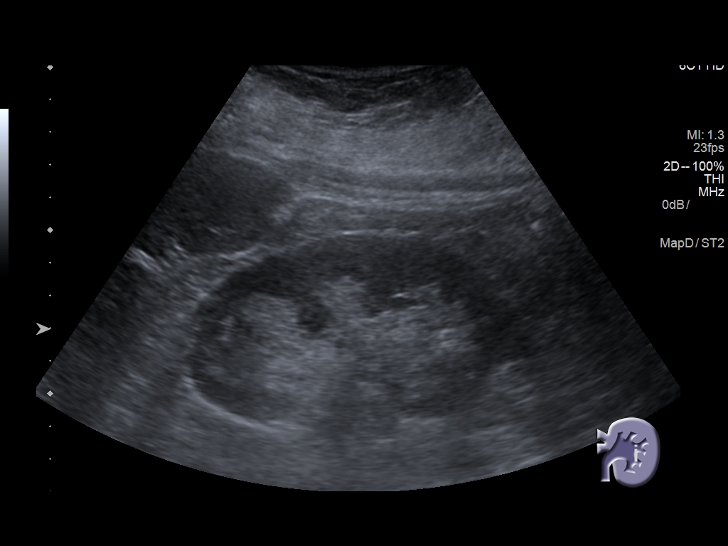
[im 37/56]
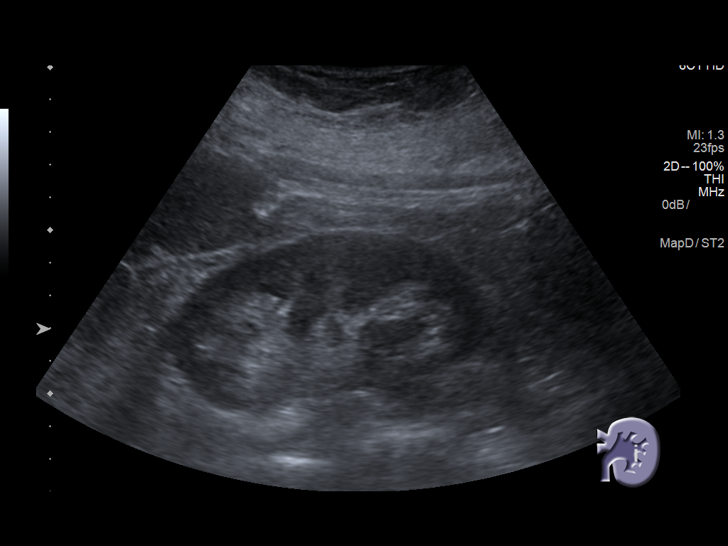
[im 42/56]
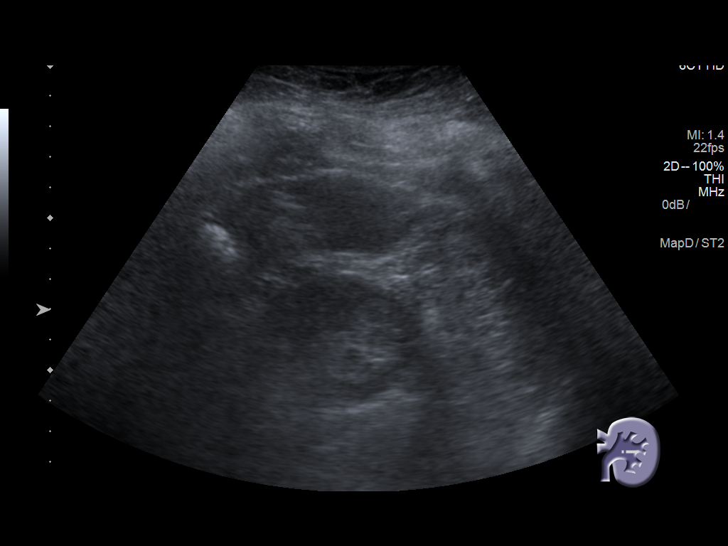
[im 46/56]
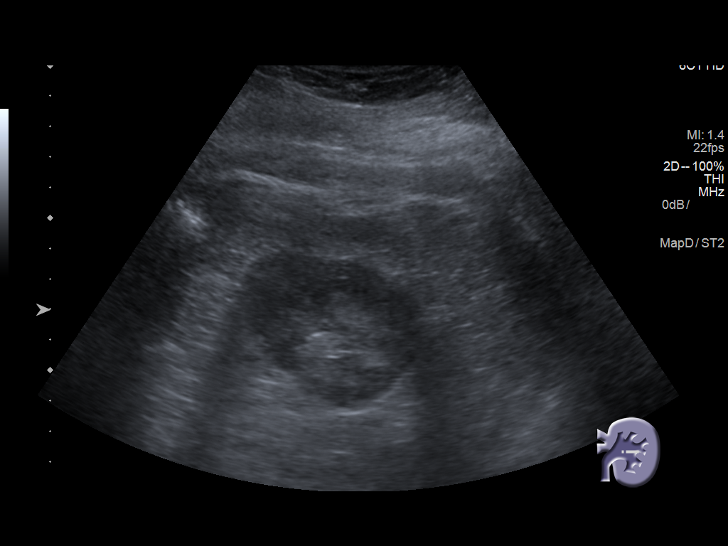
[im 51/56]
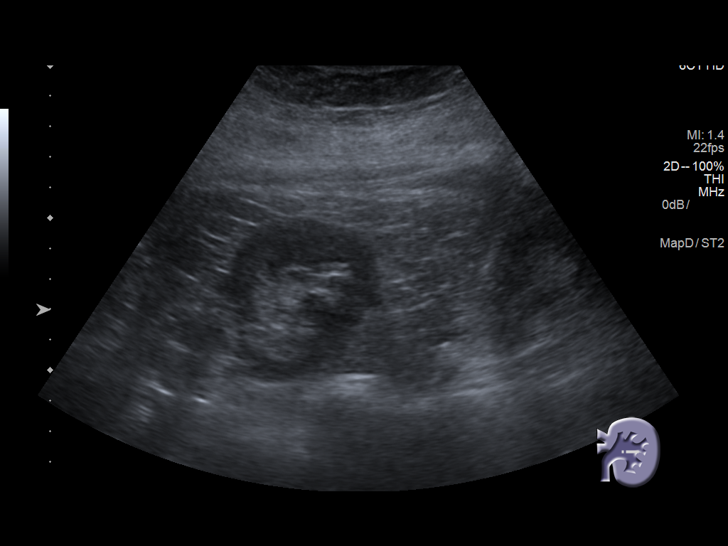
[im 56/56]
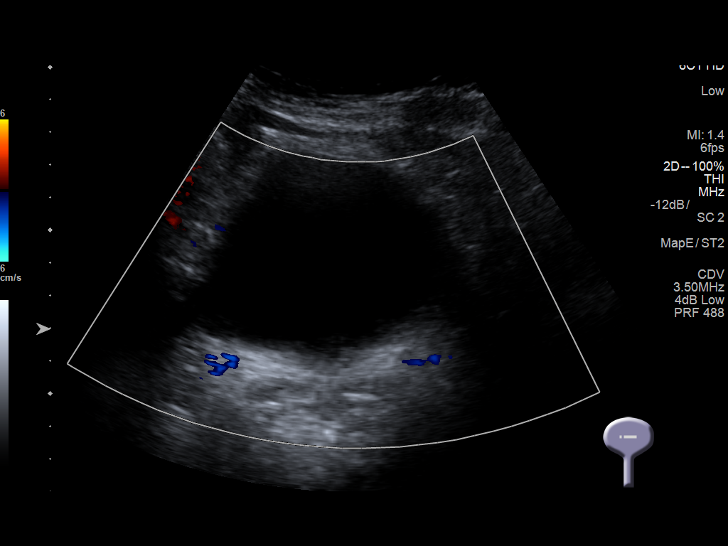

[14 of 25 positions shown; findings below may reference images not displayed]

FINDINGS: Right Kidney:

Length: 11.0 cm.  No mass or hydronephrosis.

Left Kidney:

Length: 11.1 cm.  No mass or hydronephrosis.

Bladder:

Within normal limits.
IMPRESSION: Negative renal ultrasound.

## 2018-03-25 ENCOUNTER — Telehealth (INDEPENDENT_AMBULATORY_CARE_PROVIDER_SITE_OTHER): Payer: Self-pay | Admitting: Internal Medicine

## 2018-03-25 DIAGNOSIS — J320 Chronic maxillary sinusitis: Secondary | ICD-10-CM

## 2018-03-25 MED ORDER — LEVOFLOXACIN 500 MG PO TABS: 500.0000 mg | ORAL_TABLET | Freq: Every day | ORAL | 2 refills | Status: DC

## 2018-03-25 MED FILL — levoFLOXacin 500 MG TABS: 500 | 20 days supply | Qty: 20 | Fill #0

## 2018-03-25 NOTE — Telephone Encounter (Signed)
Rx sent to his pharmacy

## 2018-04-28 MED FILL — TORSEMIDE 20 MG TABLET: 20 | 60 days supply | Qty: 90 | Fill #1

## 2018-08-07 MED FILL — TORSEMIDE 20 MG TABLET: 20 | 60 days supply | Qty: 90 | Fill #2

## 2018-09-26 ENCOUNTER — Ambulatory Visit: Payer: 59 | Admitting: Internal Medicine

## 2018-11-11 ENCOUNTER — Telehealth (INDEPENDENT_AMBULATORY_CARE_PROVIDER_SITE_OTHER): Payer: Self-pay | Admitting: Internal Medicine

## 2018-11-11 DIAGNOSIS — R609 Edema, unspecified: Secondary | ICD-10-CM

## 2018-11-11 MED ORDER — TORSEMIDE 20 MG PO TABS
20.0000 mg | ORAL_TABLET | Freq: Every day | ORAL | 3 refills | Status: DC
Start: 2018-11-11 — End: 2020-02-23

## 2018-11-11 MED FILL — TORSEMIDE 20 MG TABLET: 20 | 60 days supply | Qty: 90 | Fill #0

## 2018-11-11 NOTE — Telephone Encounter (Signed)
Rx sent to [facility] 

## 2018-11-28 DIAGNOSIS — Z0000 Encounter for general adult medical examination without abnormal findings: Secondary | ICD-10-CM | POA: Diagnosis not present

## 2018-11-28 DIAGNOSIS — Z299 Encounter for prophylactic measures, unspecified: Secondary | ICD-10-CM | POA: Diagnosis not present

## 2018-11-28 DIAGNOSIS — Z1331 Encounter for screening for depression: Secondary | ICD-10-CM | POA: Diagnosis not present

## 2018-11-28 DIAGNOSIS — Z79899 Other long term (current) drug therapy: Secondary | ICD-10-CM | POA: Diagnosis not present

## 2018-11-28 DIAGNOSIS — Z6837 Body mass index (BMI) 37.0-37.9, adult: Secondary | ICD-10-CM | POA: Diagnosis not present

## 2018-11-28 DIAGNOSIS — F419 Anxiety disorder, unspecified: Secondary | ICD-10-CM | POA: Diagnosis not present

## 2019-01-23 ENCOUNTER — Encounter: Payer: Self-pay | Admitting: Internal Medicine

## 2019-01-23 ENCOUNTER — Other Ambulatory Visit: Payer: Self-pay

## 2019-01-23 ENCOUNTER — Ambulatory Visit (INDEPENDENT_AMBULATORY_CARE_PROVIDER_SITE_OTHER): Payer: 59 | Admitting: Internal Medicine

## 2019-01-23 VITALS — BP 130/88 | HR 68 | Temp 97.7°F | Ht 65.0 in | Wt 223.4 lb

## 2019-01-23 DIAGNOSIS — G4733 Obstructive sleep apnea (adult) (pediatric): Secondary | ICD-10-CM

## 2019-01-23 NOTE — Patient Instructions (Signed)
Order- schedule CPAP mask fitting   Dx OSA  Order- schedule home sleep test. Not wearing CPAP that night.  If you need CPAP, the goal is to wear it all night, every night.  Don't forget that flu shot this fall

## 2019-01-23 NOTE — Progress Notes (Signed)
HPI female former smoker followed for OSA, complicated by GERD, HBP, morbid obesity/bariatric surgery, kidney stones NPSG 04/06/2016- AHI 17.4/hour, oxygen desaturation to 85%, body weight 271 pounds Gastric sleeve bariatric surgery December 2017  ---------------------------------------------------------------------------------------------------  09/27/2017- 58 year old female former smoker followed for OSA, complicated by GERD, HBP, morbid obesity/bariatric surgery, kidney stones CPAP auto 5-15 /[facility] ----OSA: Pt has not been using CPAP as its hard to use due to TMJ and  uses a mouth piece.  She says she tosses and turns and has trouble maintaining mask seal.  01/23/2019- 58 year old female former smoker followed for OSA, complicated by GERD, HBP, morbid obesity/bariatric surgery, kidney stones CPAP auto 5-15 /[facility] Download- 13% compliance, AHI0.5/ hr After last visit we asked [facility] to refit mask. Wears mouthpiece for TMJ that was interfering with CPAP use.  -----OSA on CPAP Auto 5-15, DME: [facility], pt states she does not wear CPAP every night, but she does wear it Body weight today 223 lbs is down 50 lbs since original sleep study. She says her husband denies hearing snore now if she doesn't wear CPAP.  Covid careful. Will get flu shot at work.  ROS-see HPI  + = positive Constitutional:    weight loss, night sweats, fevers, chills, fatigue, lassitude. HEENT:    +headaches, difficulty swallowing, tooth/dental problems, sore throat,       sneezing, itching, ear ache, nasal congestion, post nasal drip, snoring CV:    chest pain, orthopnea, PND, swelling in lower extremities, anasarca,                                                  dizziness, palpitations Resp:   shortness of breath with exertion or at rest.                productive cough,   non-productive cough, coughing up of blood.              change in color of mucus.  wheezing.   Skin:    rash or lesions. GI:     +heartburn, indigestion, abdominal pain, nausea, vomiting, diarrhea,                 change in bowel habits, loss of appetite GU: dysuria, change in color of urine, no urgency or frequency.   flank pain. MS:   joint pain, stiffness, decreased range of motion, back pain. Neuro-     nothing unusual Psych:  change in mood or affect.  depression or anxiety.   memory loss.  OBJ- Physical Exam General- Alert, Oriented, Affect-appropriate, Distress- none acute,  + overweight Skin- rash-none, lesions- none, excoriation- none Lymphadenopathy- none Head- atraumatic            Eyes- Gross vision intact, PERRLA, conjunctivae and secretions clear            Ears- Hearing, canals-normal            Nose- Clear, no-Septal dev, mucus, polyps, erosion, perforation             Throat- Mallampati II-III , mucosa clear , drainage- none, tonsils- atrophic Neck- flexible , trachea midline, no stridor , thyroid nl, carotid no bruit Chest - symmetrical excursion , unlabored           Heart/CV- RRR , no murmur , no gallop  , no rub, nl s1 s2                           -  JVD- none , edema- none, stasis changes- none, varices- none           Lung- clear to P&A, wheeze- none, cough- none , dullness-none, rub- none           Chest wall-  Abd-  Br/ Gen/ Rectal- Not done, not indicated Extrem- cyanosis- none, clubbing, none, atrophy- none, strength- nl Neuro- grossly intact to observation

## 2019-01-23 NOTE — Assessment & Plan Note (Signed)
Noting some weight return this summer, but is still down almost 50 lbs since 2017.  Encouraged to keep working at this.

## 2019-01-23 NOTE — Assessment & Plan Note (Signed)
CPAP is effective when used. Leak problems with full face mask. Given amount of weight loss since original sleep study, we will update sleep study. Plan- schedule CPAP mask fitting at [facility]. Schedule HST to determine if CPAP still appropriate.

## 2019-02-04 ENCOUNTER — Other Ambulatory Visit (HOSPITAL_BASED_OUTPATIENT_CLINIC_OR_DEPARTMENT_OTHER): Payer: 59 | Admitting: Internal Medicine

## 2019-02-13 DIAGNOSIS — Z86010 Personal history of colonic polyps: Secondary | ICD-10-CM | POA: Diagnosis not present

## 2019-02-13 DIAGNOSIS — Z6836 Body mass index (BMI) 36.0-36.9, adult: Secondary | ICD-10-CM | POA: Diagnosis not present

## 2019-02-13 DIAGNOSIS — Z1231 Encounter for screening mammogram for malignant neoplasm of breast: Secondary | ICD-10-CM | POA: Diagnosis not present

## 2019-02-13 DIAGNOSIS — Z13820 Encounter for screening for osteoporosis: Secondary | ICD-10-CM | POA: Diagnosis not present

## 2019-02-13 DIAGNOSIS — Z800 Family history of malignant neoplasm of digestive organs: Secondary | ICD-10-CM | POA: Diagnosis not present

## 2019-02-13 DIAGNOSIS — Z01419 Encounter for gynecological examination (general) (routine) without abnormal findings: Secondary | ICD-10-CM | POA: Diagnosis not present

## 2019-02-27 ENCOUNTER — Encounter (HOSPITAL_COMMUNITY): Payer: Self-pay

## 2019-03-27 DIAGNOSIS — Z809 Family history of malignant neoplasm, unspecified: Secondary | ICD-10-CM | POA: Diagnosis not present

## 2019-04-06 DIAGNOSIS — J40 Bronchitis, not specified as acute or chronic: Secondary | ICD-10-CM | POA: Diagnosis not present

## 2019-04-06 DIAGNOSIS — Z713 Dietary counseling and surveillance: Secondary | ICD-10-CM | POA: Diagnosis not present

## 2019-04-06 DIAGNOSIS — U071 COVID-19: Secondary | ICD-10-CM | POA: Diagnosis not present

## 2019-04-06 DIAGNOSIS — Z6837 Body mass index (BMI) 37.0-37.9, adult: Secondary | ICD-10-CM | POA: Diagnosis not present

## 2019-04-06 DIAGNOSIS — Z299 Encounter for prophylactic measures, unspecified: Secondary | ICD-10-CM | POA: Diagnosis not present

## 2019-04-09 DIAGNOSIS — J40 Bronchitis, not specified as acute or chronic: Secondary | ICD-10-CM | POA: Diagnosis not present

## 2019-04-09 DIAGNOSIS — U071 COVID-19: Secondary | ICD-10-CM | POA: Diagnosis not present

## 2019-04-09 DIAGNOSIS — Z299 Encounter for prophylactic measures, unspecified: Secondary | ICD-10-CM | POA: Diagnosis not present

## 2019-04-09 DIAGNOSIS — F419 Anxiety disorder, unspecified: Secondary | ICD-10-CM | POA: Diagnosis not present

## 2019-05-07 MED FILL — TORSEMIDE 20 MG TABLET: 20 | 60 days supply | Qty: 90 | Fill #1

## 2019-06-22 ENCOUNTER — Encounter: Payer: Self-pay | Admitting: Urology

## 2019-07-30 DIAGNOSIS — F419 Anxiety disorder, unspecified: Secondary | ICD-10-CM | POA: Diagnosis not present

## 2019-07-30 DIAGNOSIS — Z299 Encounter for prophylactic measures, unspecified: Secondary | ICD-10-CM | POA: Diagnosis not present

## 2019-07-30 DIAGNOSIS — Z6839 Body mass index (BMI) 39.0-39.9, adult: Secondary | ICD-10-CM | POA: Diagnosis not present

## 2019-07-30 DIAGNOSIS — Z713 Dietary counseling and surveillance: Secondary | ICD-10-CM | POA: Diagnosis not present

## 2019-07-31 ENCOUNTER — Ambulatory Visit: Payer: 59 | Admitting: Internal Medicine

## 2019-08-18 MED FILL — SF 5000 PLUS CREAM: 1.1 | 30 days supply | Qty: 51 | Fill #0

## 2019-08-27 DIAGNOSIS — Z299 Encounter for prophylactic measures, unspecified: Secondary | ICD-10-CM | POA: Diagnosis not present

## 2019-08-27 DIAGNOSIS — F418 Other specified anxiety disorders: Secondary | ICD-10-CM | POA: Diagnosis not present

## 2019-08-27 DIAGNOSIS — Z87891 Personal history of nicotine dependence: Secondary | ICD-10-CM | POA: Diagnosis not present

## 2019-09-08 MED FILL — TORSEMIDE 20 MG TABLET: 20 | 60 days supply | Qty: 90 | Fill #2

## 2019-12-01 DIAGNOSIS — Z1211 Encounter for screening for malignant neoplasm of colon: Secondary | ICD-10-CM | POA: Diagnosis not present

## 2019-12-01 DIAGNOSIS — Z1331 Encounter for screening for depression: Secondary | ICD-10-CM | POA: Diagnosis not present

## 2019-12-01 DIAGNOSIS — Z299 Encounter for prophylactic measures, unspecified: Secondary | ICD-10-CM | POA: Diagnosis not present

## 2019-12-01 DIAGNOSIS — Z6837 Body mass index (BMI) 37.0-37.9, adult: Secondary | ICD-10-CM | POA: Diagnosis not present

## 2019-12-01 DIAGNOSIS — Z79899 Other long term (current) drug therapy: Secondary | ICD-10-CM | POA: Diagnosis not present

## 2019-12-01 DIAGNOSIS — Z9884 Bariatric surgery status: Secondary | ICD-10-CM | POA: Diagnosis not present

## 2019-12-01 DIAGNOSIS — Z0000 Encounter for general adult medical examination without abnormal findings: Secondary | ICD-10-CM | POA: Diagnosis not present

## 2019-12-01 DIAGNOSIS — R5383 Other fatigue: Secondary | ICD-10-CM | POA: Diagnosis not present

## 2019-12-01 MED FILL — TORSEMIDE 10 MG TABLET: 10 | 40 days supply | Qty: 40 | Fill #0

## 2019-12-01 MED FILL — CITALOPRAM HBR 20 MG TABLET: 20 | 30 days supply | Qty: 30 | Fill #0

## 2019-12-02 MED FILL — VIT D2 1.25 MG (50,000 UNIT: 1.25 MG | 90 days supply | Qty: 25 | Fill #0

## 2019-12-10 ENCOUNTER — Encounter (HOSPITAL_COMMUNITY): Payer: Self-pay

## 2020-02-22 ENCOUNTER — Telehealth (INDEPENDENT_AMBULATORY_CARE_PROVIDER_SITE_OTHER): Payer: Self-pay | Admitting: Gastroenterology

## 2020-02-22 DIAGNOSIS — R300 Dysuria: Secondary | ICD-10-CM

## 2020-02-22 NOTE — Telephone Encounter (Signed)
Also has hx of kidney stones w/ hydronephrosis, check BMP as well.

## 2020-02-22 NOTE — Addendum Note (Signed)
Addended by: Laurine Blazer A on: 02/22/2020 12:20 PM   Modules accepted: Orders

## 2020-02-22 NOTE — Telephone Encounter (Signed)
Having urinary symptoms of lower abd discomfort, urgency, and odor to urine. Has hx of UTIs. Check UA and urine culture initially for evaluation.

## 2020-02-23 ENCOUNTER — Other Ambulatory Visit (INDEPENDENT_AMBULATORY_CARE_PROVIDER_SITE_OTHER): Payer: Self-pay | Admitting: Gastroenterology

## 2020-02-23 ENCOUNTER — Telehealth (INDEPENDENT_AMBULATORY_CARE_PROVIDER_SITE_OTHER): Payer: Self-pay | Admitting: Gastroenterology

## 2020-02-23 DIAGNOSIS — R609 Edema, unspecified: Secondary | ICD-10-CM

## 2020-02-23 LAB — BASIC METABOLIC PANEL WITH GFR
BUN: 11 mg/dL (ref 7–25)
CO2: 27 mmol/L (ref 20–32)
Calcium: 10.4 mg/dL (ref 8.6–10.4)
Chloride: 104 mmol/L (ref 98–110)
Creat: 0.86 mg/dL (ref 0.50–0.99)
GFR, Est African American: 85 mL/min/{1.73_m2} (ref >=60)
GFR, Est Non African American: 73 mL/min/{1.73_m2} (ref >=60)
Glucose, Bld: 97 mg/dL (ref 65–99)
Potassium: 4.3 mmol/L (ref 3.5–5.3)
Sodium: 142 mmol/L (ref 135–146)

## 2020-02-23 LAB — URINALYSIS
Bilirubin Urine: NEGATIVE
Glucose, UA: NEGATIVE
Hgb urine dipstick: NEGATIVE
Ketones, ur: NEGATIVE
Nitrite: NEGATIVE
Protein, ur: NEGATIVE
Specific Gravity, Urine: 1.006 (ref 1.001–1.03)
pH: 6 (ref 5.0–8.0)

## 2020-02-23 MED ORDER — TORSEMIDE 20 MG PO TABS: 20.0000 mg | ORAL_TABLET | Freq: Every day | ORAL | 3 refills | Status: DC

## 2020-02-23 NOTE — Telephone Encounter (Signed)
Kidney function returned normal and patient having mild lower extremity edema - she will increase to full dose of toresmide instead of half dose. Rx sent to pharmacy.

## 2020-02-24 MED FILL — TORSEMIDE 20 MG TABLET: 20 | 90 days supply | Qty: 90 | Fill #0

## 2020-02-25 ENCOUNTER — Telehealth (INDEPENDENT_AMBULATORY_CARE_PROVIDER_SITE_OTHER): Payer: Self-pay | Admitting: Gastroenterology

## 2020-02-25 DIAGNOSIS — R300 Dysuria: Secondary | ICD-10-CM

## 2020-02-25 MED ORDER — CIPROFLOXACIN HCL 500 MG PO TABS: 500.0000 mg | ORAL_TABLET | Freq: Two times a day (BID) | ORAL | 0 refills | Status: DC

## 2020-03-07 NOTE — Telephone Encounter (Signed)
Patient treated w/ Cipro for dysuria and foul odor to urine.

## 2020-04-15 DIAGNOSIS — Z1231 Encounter for screening mammogram for malignant neoplasm of breast: Secondary | ICD-10-CM | POA: Diagnosis not present

## 2020-04-15 DIAGNOSIS — Z01419 Encounter for gynecological examination (general) (routine) without abnormal findings: Secondary | ICD-10-CM | POA: Diagnosis not present

## 2020-04-15 DIAGNOSIS — Z6838 Body mass index (BMI) 38.0-38.9, adult: Secondary | ICD-10-CM | POA: Diagnosis not present

## 2020-04-26 ENCOUNTER — Telehealth (INDEPENDENT_AMBULATORY_CARE_PROVIDER_SITE_OTHER): Payer: Self-pay | Admitting: Gastroenterology

## 2020-04-26 ENCOUNTER — Other Ambulatory Visit (INDEPENDENT_AMBULATORY_CARE_PROVIDER_SITE_OTHER): Payer: Self-pay | Admitting: Gastroenterology

## 2020-04-26 MED ORDER — NYSTATIN 100000 UNIT/GM EX POWD: Freq: Two times a day (BID) | CUTANEOUS | 2 refills | Status: DC | PRN

## 2020-04-26 MED FILL — NYSTATIN 100,000 UNIT/GM PO: 100000 | 7 days supply | Qty: 30 | Fill #0

## 2020-04-26 NOTE — Telephone Encounter (Signed)
Refill sent to pharmacy. Patient sees improvment w/ nystatin cream PRN use

## 2020-06-02 ENCOUNTER — Ambulatory Visit: Payer: 59 | Attending: Internal Medicine

## 2020-06-02 DIAGNOSIS — Z23 Encounter for immunization: Secondary | ICD-10-CM

## 2020-06-02 NOTE — Progress Notes (Signed)
Covid-19 Vaccination Clinic  Name:  MAELENA HAUSHALTER    MRN: [id number] DOB: [birth year]  06/02/2020  Ms. Earnheart was observed post Covid-19 immunization for 15 minutes without incident. She was provided with Vaccine Information Sheet and instruction to access the V-Safe system.   Ms. Obenauf was instructed to call 911 with any severe reactions post vaccine: [name] Kitchen Difficulty breathing  . Swelling of face and throat  . A fast heartbeat  . A bad rash all over body  . Dizziness and weakness   Immunizations Administered    Name Date Dose VIS Date Route   Pfizer COVID-19 Vaccine 06/02/2020  1:11 PM 0.3 mL 03/23/2020 Intramuscular   Manufacturer: [employer], [employer]   Lot: [id number]   NDC: [address]-6

## 2020-06-23 MED FILL — TORSEMIDE 20 MG TABLET: 20 | 90 days supply | Qty: 90 | Fill #1

## 2020-08-26 DIAGNOSIS — Z299 Encounter for prophylactic measures, unspecified: Secondary | ICD-10-CM | POA: Diagnosis not present

## 2020-08-26 DIAGNOSIS — R300 Dysuria: Secondary | ICD-10-CM | POA: Diagnosis not present

## 2020-08-26 DIAGNOSIS — N390 Urinary tract infection, site not specified: Secondary | ICD-10-CM | POA: Diagnosis not present

## 2020-08-26 DIAGNOSIS — Z6841 Body Mass Index (BMI) 40.0 and over, adult: Secondary | ICD-10-CM | POA: Diagnosis not present

## 2020-08-26 DIAGNOSIS — R21 Rash and other nonspecific skin eruption: Secondary | ICD-10-CM | POA: Diagnosis not present

## 2020-08-26 DIAGNOSIS — Z87891 Personal history of nicotine dependence: Secondary | ICD-10-CM | POA: Diagnosis not present

## 2020-09-13 ENCOUNTER — Other Ambulatory Visit (HOSPITAL_COMMUNITY): Payer: Self-pay

## 2020-09-22 ENCOUNTER — Other Ambulatory Visit (HOSPITAL_COMMUNITY): Payer: Self-pay

## 2020-09-22 MED FILL — Torsemide Tab 20 MG: ORAL | 90 days supply | Qty: 90 | Fill #0 | Status: CN

## 2020-09-30 ENCOUNTER — Other Ambulatory Visit (HOSPITAL_COMMUNITY): Payer: Self-pay

## 2020-10-10 ENCOUNTER — Other Ambulatory Visit (HOSPITAL_COMMUNITY): Payer: Self-pay

## 2020-10-10 MED FILL — Torsemide Tab 20 MG: ORAL | 90 days supply | Qty: 90 | Fill #0 | Status: AC

## 2020-11-25 DIAGNOSIS — Z79899 Other long term (current) drug therapy: Secondary | ICD-10-CM | POA: Diagnosis not present

## 2020-11-25 DIAGNOSIS — Z9884 Bariatric surgery status: Secondary | ICD-10-CM | POA: Diagnosis not present

## 2020-11-25 DIAGNOSIS — Z6841 Body Mass Index (BMI) 40.0 and over, adult: Secondary | ICD-10-CM | POA: Diagnosis not present

## 2020-11-25 DIAGNOSIS — Z299 Encounter for prophylactic measures, unspecified: Secondary | ICD-10-CM | POA: Diagnosis not present

## 2020-11-25 DIAGNOSIS — Z0000 Encounter for general adult medical examination without abnormal findings: Secondary | ICD-10-CM | POA: Diagnosis not present

## 2020-11-25 DIAGNOSIS — R5383 Other fatigue: Secondary | ICD-10-CM | POA: Diagnosis not present

## 2020-11-25 DIAGNOSIS — N390 Urinary tract infection, site not specified: Secondary | ICD-10-CM | POA: Diagnosis not present

## 2020-11-25 DIAGNOSIS — Z1331 Encounter for screening for depression: Secondary | ICD-10-CM | POA: Diagnosis not present

## 2020-11-25 DIAGNOSIS — Z87891 Personal history of nicotine dependence: Secondary | ICD-10-CM | POA: Diagnosis not present

## 2020-11-29 ENCOUNTER — Other Ambulatory Visit (HOSPITAL_COMMUNITY): Payer: Self-pay

## 2020-11-29 MED ORDER — VITAMIN D (ERGOCALCIFEROL) 1.25 MG (50000 UNIT) PO CAPS
50000.0000 [IU] | ORAL_CAPSULE | ORAL | 4 refills | Status: DC
  Filled 2020-11-29: qty 25, 88d supply, fill #0

## 2020-12-08 ENCOUNTER — Encounter (HOSPITAL_COMMUNITY): Payer: Self-pay | Admitting: *Deleted

## 2020-12-16 ENCOUNTER — Other Ambulatory Visit (HOSPITAL_COMMUNITY): Payer: Self-pay

## 2020-12-16 DIAGNOSIS — N390 Urinary tract infection, site not specified: Secondary | ICD-10-CM | POA: Diagnosis not present

## 2020-12-16 DIAGNOSIS — Z6841 Body Mass Index (BMI) 40.0 and over, adult: Secondary | ICD-10-CM | POA: Diagnosis not present

## 2020-12-16 DIAGNOSIS — Z713 Dietary counseling and surveillance: Secondary | ICD-10-CM | POA: Diagnosis not present

## 2020-12-16 DIAGNOSIS — R21 Rash and other nonspecific skin eruption: Secondary | ICD-10-CM | POA: Diagnosis not present

## 2020-12-16 DIAGNOSIS — Z299 Encounter for prophylactic measures, unspecified: Secondary | ICD-10-CM | POA: Diagnosis not present

## 2020-12-16 MED ORDER — PREDNISONE 5 MG (21) PO TBPK: ORAL_TABLET | ORAL | 0 refills | Status: AC

## 2021-01-16 ENCOUNTER — Other Ambulatory Visit (HOSPITAL_COMMUNITY): Payer: Self-pay

## 2021-01-16 MED FILL — Torsemide Tab 20 MG: ORAL | 90 days supply | Qty: 90 | Fill #1 | Status: AC

## 2021-01-24 DIAGNOSIS — Z87891 Personal history of nicotine dependence: Secondary | ICD-10-CM | POA: Diagnosis not present

## 2021-01-24 DIAGNOSIS — Z6841 Body Mass Index (BMI) 40.0 and over, adult: Secondary | ICD-10-CM | POA: Diagnosis not present

## 2021-01-24 DIAGNOSIS — J329 Chronic sinusitis, unspecified: Secondary | ICD-10-CM | POA: Diagnosis not present

## 2021-01-24 DIAGNOSIS — Z713 Dietary counseling and surveillance: Secondary | ICD-10-CM | POA: Diagnosis not present

## 2021-01-24 DIAGNOSIS — Z299 Encounter for prophylactic measures, unspecified: Secondary | ICD-10-CM | POA: Diagnosis not present

## 2021-04-04 DIAGNOSIS — F418 Other specified anxiety disorders: Secondary | ICD-10-CM | POA: Diagnosis not present

## 2021-04-04 DIAGNOSIS — L0390 Cellulitis, unspecified: Secondary | ICD-10-CM | POA: Diagnosis not present

## 2021-04-04 DIAGNOSIS — E2839 Other primary ovarian failure: Secondary | ICD-10-CM | POA: Diagnosis not present

## 2021-04-04 DIAGNOSIS — Z2821 Immunization not carried out because of patient refusal: Secondary | ICD-10-CM | POA: Diagnosis not present

## 2021-04-04 DIAGNOSIS — Z299 Encounter for prophylactic measures, unspecified: Secondary | ICD-10-CM | POA: Diagnosis not present

## 2021-04-04 DIAGNOSIS — R5383 Other fatigue: Secondary | ICD-10-CM | POA: Diagnosis not present

## 2021-04-18 DIAGNOSIS — Z299 Encounter for prophylactic measures, unspecified: Secondary | ICD-10-CM | POA: Diagnosis not present

## 2021-04-18 DIAGNOSIS — L03119 Cellulitis of unspecified part of limb: Secondary | ICD-10-CM | POA: Diagnosis not present

## 2021-04-18 DIAGNOSIS — R109 Unspecified abdominal pain: Secondary | ICD-10-CM | POA: Diagnosis not present

## 2021-04-18 DIAGNOSIS — F419 Anxiety disorder, unspecified: Secondary | ICD-10-CM | POA: Diagnosis not present

## 2021-04-18 DIAGNOSIS — L719 Rosacea, unspecified: Secondary | ICD-10-CM | POA: Diagnosis not present

## 2021-05-01 DIAGNOSIS — Z1231 Encounter for screening mammogram for malignant neoplasm of breast: Secondary | ICD-10-CM | POA: Diagnosis not present

## 2021-05-01 DIAGNOSIS — Z13820 Encounter for screening for osteoporosis: Secondary | ICD-10-CM | POA: Diagnosis not present

## 2021-05-01 DIAGNOSIS — Z6841 Body Mass Index (BMI) 40.0 and over, adult: Secondary | ICD-10-CM | POA: Diagnosis not present

## 2021-05-01 DIAGNOSIS — Z01419 Encounter for gynecological examination (general) (routine) without abnormal findings: Secondary | ICD-10-CM | POA: Diagnosis not present

## 2021-06-23 DIAGNOSIS — M8580 Other specified disorders of bone density and structure, unspecified site: Secondary | ICD-10-CM | POA: Diagnosis not present

## 2021-07-27 DIAGNOSIS — Z299 Encounter for prophylactic measures, unspecified: Secondary | ICD-10-CM | POA: Diagnosis not present

## 2021-07-27 DIAGNOSIS — Z20822 Contact with and (suspected) exposure to covid-19: Secondary | ICD-10-CM | POA: Diagnosis not present

## 2021-07-27 DIAGNOSIS — J329 Chronic sinusitis, unspecified: Secondary | ICD-10-CM | POA: Diagnosis not present

## 2021-07-27 DIAGNOSIS — R059 Cough, unspecified: Secondary | ICD-10-CM | POA: Diagnosis not present

## 2021-08-10 DIAGNOSIS — J339 Nasal polyp, unspecified: Secondary | ICD-10-CM | POA: Diagnosis not present

## 2021-08-10 DIAGNOSIS — M2550 Pain in unspecified joint: Secondary | ICD-10-CM | POA: Diagnosis not present

## 2021-08-10 DIAGNOSIS — Z299 Encounter for prophylactic measures, unspecified: Secondary | ICD-10-CM | POA: Diagnosis not present

## 2021-08-10 DIAGNOSIS — Z6841 Body Mass Index (BMI) 40.0 and over, adult: Secondary | ICD-10-CM | POA: Diagnosis not present

## 2021-10-17 ENCOUNTER — Other Ambulatory Visit (HOSPITAL_COMMUNITY): Payer: Self-pay

## 2021-10-17 DIAGNOSIS — Z299 Encounter for prophylactic measures, unspecified: Secondary | ICD-10-CM | POA: Diagnosis not present

## 2021-10-17 DIAGNOSIS — R42 Dizziness and giddiness: Secondary | ICD-10-CM | POA: Diagnosis not present

## 2021-10-17 DIAGNOSIS — J320 Chronic maxillary sinusitis: Secondary | ICD-10-CM | POA: Diagnosis not present

## 2021-10-17 DIAGNOSIS — Z789 Other specified health status: Secondary | ICD-10-CM | POA: Diagnosis not present

## 2021-10-17 MED ORDER — TORSEMIDE 10 MG PO TABS
10.0000 mg | ORAL_TABLET | Freq: Every day | ORAL | 1 refills | Status: DC | PRN
  Filled 2021-10-17: qty 90, 90d supply, fill #0
  Filled 2022-09-25: qty 90, 90d supply, fill #1
  Filled 2022-09-26 – 2022-10-11 (×2): qty 90, 90d supply, fill #0

## 2021-10-31 ENCOUNTER — Other Ambulatory Visit (HOSPITAL_COMMUNITY): Payer: Self-pay

## 2021-11-28 DIAGNOSIS — Z79899 Other long term (current) drug therapy: Secondary | ICD-10-CM | POA: Diagnosis not present

## 2021-11-28 DIAGNOSIS — E559 Vitamin D deficiency, unspecified: Secondary | ICD-10-CM | POA: Diagnosis not present

## 2021-11-28 DIAGNOSIS — Z299 Encounter for prophylactic measures, unspecified: Secondary | ICD-10-CM | POA: Diagnosis not present

## 2021-11-28 DIAGNOSIS — F419 Anxiety disorder, unspecified: Secondary | ICD-10-CM | POA: Diagnosis not present

## 2021-11-28 DIAGNOSIS — Z0000 Encounter for general adult medical examination without abnormal findings: Secondary | ICD-10-CM | POA: Diagnosis not present

## 2021-11-28 DIAGNOSIS — Z1331 Encounter for screening for depression: Secondary | ICD-10-CM | POA: Diagnosis not present

## 2021-11-28 DIAGNOSIS — Z789 Other specified health status: Secondary | ICD-10-CM | POA: Diagnosis not present

## 2021-12-08 ENCOUNTER — Encounter (HOSPITAL_COMMUNITY): Payer: Self-pay | Admitting: *Deleted

## 2021-12-28 DIAGNOSIS — Z0289 Encounter for other administrative examinations: Secondary | ICD-10-CM

## 2022-01-11 ENCOUNTER — Encounter (INDEPENDENT_AMBULATORY_CARE_PROVIDER_SITE_OTHER): Payer: Self-pay | Admitting: Bariatrics

## 2022-01-11 ENCOUNTER — Ambulatory Visit (INDEPENDENT_AMBULATORY_CARE_PROVIDER_SITE_OTHER): Payer: 59 | Admitting: Bariatrics

## 2022-01-11 VITALS — BP 113/72 | HR 74 | Temp 97.9°F | Ht 64.0 in | Wt 241.0 lb

## 2022-01-11 DIAGNOSIS — Z1331 Encounter for screening for depression: Secondary | ICD-10-CM

## 2022-01-11 DIAGNOSIS — Z0000 Encounter for general adult medical examination without abnormal findings: Secondary | ICD-10-CM | POA: Insufficient documentation

## 2022-01-11 DIAGNOSIS — R7309 Other abnormal glucose: Secondary | ICD-10-CM

## 2022-01-11 DIAGNOSIS — G4733 Obstructive sleep apnea (adult) (pediatric): Secondary | ICD-10-CM

## 2022-01-11 DIAGNOSIS — Z9884 Bariatric surgery status: Secondary | ICD-10-CM | POA: Diagnosis not present

## 2022-01-11 DIAGNOSIS — K582 Mixed irritable bowel syndrome: Secondary | ICD-10-CM | POA: Insufficient documentation

## 2022-01-11 DIAGNOSIS — R5383 Other fatigue: Secondary | ICD-10-CM | POA: Insufficient documentation

## 2022-01-11 DIAGNOSIS — E669 Obesity, unspecified: Secondary | ICD-10-CM | POA: Diagnosis not present

## 2022-01-11 DIAGNOSIS — R0602 Shortness of breath: Secondary | ICD-10-CM | POA: Insufficient documentation

## 2022-01-11 DIAGNOSIS — K760 Fatty (change of) liver, not elsewhere classified: Secondary | ICD-10-CM | POA: Diagnosis not present

## 2022-01-11 DIAGNOSIS — Z6841 Body Mass Index (BMI) 40.0 and over, adult: Secondary | ICD-10-CM | POA: Insufficient documentation

## 2022-01-12 LAB — COMPREHENSIVE METABOLIC PANEL
ALT: 23 IU/L (ref 0–32)
AST: 17 IU/L (ref 0–40)
Albumin/Globulin Ratio: 1.8 (ref 1.2–2.2)
Albumin: 4.6 g/dL (ref 3.9–4.9)
Alkaline Phosphatase: 74 IU/L (ref 44–121)
BUN/Creatinine Ratio: 22 (ref 12–28)
BUN: 17 mg/dL (ref 8–27)
Bilirubin Total: 0.4 mg/dL (ref 0.0–1.2)
CO2: 22 mmol/L (ref 20–29)
Calcium: 9.9 mg/dL (ref 8.7–10.3)
Chloride: 104 mmol/L (ref 96–106)
Creatinine, Ser: 0.79 mg/dL (ref 0.57–1.00)
Globulin, Total: 2.5 g/dL (ref 1.5–4.5)
Glucose: 98 mg/dL (ref 70–99)
Potassium: 4.3 mmol/L (ref 3.5–5.2)
Sodium: 143 mmol/L (ref 134–144)
Total Protein: 7.1 g/dL (ref 6.0–8.5)
eGFR: 85 mL/min/{1.73_m2} (ref >59)

## 2022-01-12 LAB — INSULIN, RANDOM: INSULIN: 37.4 u[IU]/mL — ABNORMAL HIGH (ref 2.6–24.9)

## 2022-01-12 LAB — HEMOGLOBIN A1C
Est. average glucose Bld gHb Est-mCnc: 111 mg/dL
Hgb A1c MFr Bld: 5.5 % (ref 4.8–5.6)

## 2022-01-15 ENCOUNTER — Encounter (INDEPENDENT_AMBULATORY_CARE_PROVIDER_SITE_OTHER): Payer: Self-pay | Admitting: Bariatrics

## 2022-01-15 DIAGNOSIS — E8881 Metabolic syndrome: Secondary | ICD-10-CM | POA: Insufficient documentation

## 2022-01-23 NOTE — Progress Notes (Signed)
Chief Complaint:   OBESITY Marilyn Martin (MR# [id number]) is a 61 y.o. female who presents for evaluation and treatment of obesity and related comorbidities. Current BMI is Body mass index is 41.37 kg/m. Marilyn Martin has been struggling with her weight for many years and has been unsuccessful in either losing weight, maintaining weight loss, or reaching her healthy weight goal.  Marilyn Martin does like to cook, but she notes time as an obstacle.  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  She is a stress eater.  Marilyn Martin is currently in the action stage of change and ready to dedicate time achieving and maintaining a healthier weight. Marilyn Martin is interested in becoming our patient and working on intensive lifestyle modifications including (but not limited to) diet and exercise for weight loss.  Marilyn Martin's habits were reviewed today and are as follows: Her family eats meals together, she thinks her family will eat healthier with her, she struggles with family and or coworkers weight loss sabotage, her desired weight loss is 81-91 lbs, she has been heavy most of her life, she started gaining weight after her miscarriage and C-section, her heaviest weight ever was 275 pounds, she has significant food cravings issues, she snacks frequently in the evenings, she skips meals frequently, she is frequently drinking liquids with calories, she frequently makes poor food choices, and she struggles with emotional eating.  Depression Screen Marilyn Martin's Food and Mood (modified PHQ-9) score was 10.     01/11/2022    7:44 AM  Depression screen PHQ 2/9  Decreased Interest 1  Down, Depressed, Hopeless 1  PHQ - 2 Score 2  Altered sleeping 2  Tired, decreased energy 2  Change in appetite 1  Feeling bad or failure about yourself  1  Trouble concentrating 1  Moving slowly or fidgety/restless 1  Suicidal thoughts 0  PHQ-9 Score 10  Difficult doing work/chores Not difficult at all   Subjective:   1. Other fatigue Marilyn Martin admits to daytime  somnolence and admits to waking up still tired. Patient has a history of symptoms of daytime fatigue, morning fatigue, and morning headache. Marilyn Martin generally gets 6 or 7 hours of sleep per night, and states that she has nightime awakenings. Snoring is present. Apneic episodes are not present. Epworth Sleepiness Score is 12.   2. SOB (shortness of breath) on exertion Marilyn Martin notes increasing shortness of breath with exercising and seems to be worsening over time with weight gain. She notes getting out of breath sooner with activity than she used to. This has not gotten worse recently. Marilyn Martin denies shortness of breath at rest or orthopnea.  3. Obstructive sleep apnea Marilyn Martin has a CPAP machine.   4. Fatty liver Marilyn Martin's fatty liver was found on an ultrasound when she had gallbladder issues.   5. Health care maintenance Given obesity.   6. S/P laparoscopic sleeve gastrectomy Dec 2017 Marilyn Martin is status post laparoscopic sleeve gastrectomy done by Dr. Rodman Key.  Her heaviest weight was 275 pounds, and her lowest weight was 195 pounds.  7. Irritable bowel syndrome with both constipation and diarrhea Marilyn Martin notes both diarrhea and constipation.  8. Elevated glucose Marilyn Martin is not on medications currently.  Assessment/Plan:   1. Other fatigue Marilyn Martin does feel that her weight is causing her energy to be lower than it should be. Fatigue may be related to obesity, depression or many other causes. Labs will be ordered, and in the meanwhile, Marilyn Martin will focus on self care including making healthy food choices, increasing physical  activity and focusing on stress reduction.  - EKG 12-Lead  2. SOB (shortness of breath) on exertion Marilyn Martin does feel that she gets out of breath more easily that she used to when she exercises. Marilyn Martin's shortness of breath appears to be obesity related and exercise induced. She has agreed to work on weight loss and gradually increase exercise to treat her exercise induced shortness of breath. Will  continue to monitor closely.  3. Obstructive sleep apnea Marilyn Martin will continue with her CPAP nightly.  We discussed sleep hygiene today.  4. Fatty liver We will check labs today, and we will follow-up at Marilyn Martin's next visit.  - Comprehensive metabolic panel - Hemoglobin A1c - Insulin, random  5. Health care maintenance We will check labs today.  EKG and IC were done today and you with the patient.  - Comprehensive metabolic panel - Hemoglobin A1c - Insulin, random  6. S/P laparoscopic sleeve gastrectomy Dec 2017 We will check labs today.  Marilyn Martin will eat small frequent meals, and keep her protein intake high.  - Comprehensive metabolic panel - Hemoglobin A1c - Insulin, random  7. Irritable bowel syndrome with both constipation and diarrhea We discussed increasing water and fiber.  Marilyn Martin will use MiraLAX as needed.  8. Elevated glucose We will check labs today, and we will follow-up at Marilyn Martin's next visit.  - Hemoglobin A1c - Insulin, random  9. Depression screening Marilyn Martin had a positive depression screening. Depression is commonly associated with obesity and often results in emotional eating behaviors. We will monitor this closely and work on CBT to help improve the non-hunger eating patterns. Referral to Psychology may be required if no improvement is seen as she continues in our clinic.  10. Obesity, Current BMI 41.5 Marilyn Martin is currently in the action stage of change and her goal is to continue with weight loss efforts. I recommend Marilyn Martin begin the structured treatment plan as follows:  She has agreed to the Category 3 Plan.  No planning and intentional eating were discussed.  She will minimize meal skipping.  She will have a shake in the morning.  Exercise goals: No exercise has been prescribed at this time.   Behavioral modification strategies: increasing lean protein intake, decreasing simple carbohydrates, increasing vegetables, increasing water intake, decreasing eating out, no  skipping meals, meal planning and cooking strategies, keeping healthy foods in the home, and planning for success.  She was informed of the importance of frequent follow-up visits to maximize her success with intensive lifestyle modifications for her multiple health conditions. She was informed we would discuss her lab results at her next visit unless there is a critical issue that needs to be addressed sooner. Marilyn Martin agreed to keep her next visit at the agreed upon time to discuss these results.  Objective:   Blood pressure 113/72, pulse 74, temperature 97.9 F (36.6 C), height '5\' 4"'$  (1.626 m), weight 241 lb (109.3 kg), SpO2 96 %. Body mass index is 41.37 kg/m.  EKG: Normal sinus rhythm, rate 75 BPM.  Indirect Calorimeter completed today shows a VO2 of 290 and a REE of 2002.  Her calculated basal metabolic rate is 1324 thus her basal metabolic rate is better than expected.  General: Cooperative, alert, well developed, in no acute distress. HEENT: Conjunctivae and lids unremarkable. Cardiovascular: Regular rhythm.  Lungs: Normal work of breathing. Neurologic: No focal deficits.   Lab Results  Component Value Date   CREATININE 0.79 01/11/2022   BUN 17 01/11/2022   NA 143 01/11/2022  K 4.3 01/11/2022   CL 104 01/11/2022   CO2 22 01/11/2022   Lab Results  Component Value Date   ALT 23 01/11/2022   AST 17 01/11/2022   ALKPHOS 74 01/11/2022   BILITOT 0.4 01/11/2022   Lab Results  Component Value Date   HGBA1C 5.5 01/11/2022   Lab Results  Component Value Date   INSULIN 37.4 (H) 01/11/2022   Lab Results  Component Value Date   TSH 2.141 04/09/2011   Lab Results  Component Value Date   CHOL 136 04/09/2011   HDL 40 04/09/2011   LDLCALC 82 04/09/2011   TRIG 72 04/09/2011   CHOLHDL 3.4 04/09/2011   Lab Results  Component Value Date   WBC 7.9 05/15/2016   HGB 13.5 05/15/2016   HCT 38.6 05/15/2016   MCV 88.3 05/15/2016   PLT 208 05/15/2016   No results found for:  "IRON", "TIBC", "FERRITIN"  Attestation Statements:   Reviewed by clinician on day of visit: allergies, medications, problem list, medical history, surgical history, family history, social history, and previous encounter notes.   Wilhemena Durie, am acting as Location manager for [employer], DO.  I have reviewed the above documentation for accuracy and completeness, and I agree with the above. Jearld Lesch, DO

## 2022-01-25 ENCOUNTER — Encounter (INDEPENDENT_AMBULATORY_CARE_PROVIDER_SITE_OTHER): Payer: Self-pay | Admitting: Bariatrics

## 2022-01-25 ENCOUNTER — Ambulatory Visit (INDEPENDENT_AMBULATORY_CARE_PROVIDER_SITE_OTHER): Payer: 59 | Admitting: Bariatrics

## 2022-01-25 VITALS — BP 125/80 | HR 79 | Temp 97.7°F | Ht 64.0 in | Wt 238.0 lb

## 2022-01-25 DIAGNOSIS — E669 Obesity, unspecified: Secondary | ICD-10-CM

## 2022-01-25 DIAGNOSIS — E8881 Metabolic syndrome: Secondary | ICD-10-CM

## 2022-01-25 DIAGNOSIS — K760 Fatty (change of) liver, not elsewhere classified: Secondary | ICD-10-CM

## 2022-01-25 DIAGNOSIS — Z6841 Body Mass Index (BMI) 40.0 and over, adult: Secondary | ICD-10-CM | POA: Diagnosis not present

## 2022-01-30 ENCOUNTER — Encounter (INDEPENDENT_AMBULATORY_CARE_PROVIDER_SITE_OTHER): Payer: Self-pay | Admitting: Bariatrics

## 2022-01-30 NOTE — Progress Notes (Signed)
Chief Complaint:   OBESITY Marilyn Martin is here to discuss her progress with her obesity treatment plan along with follow-up of her obesity related diagnoses. Marilyn Martin is on the Category 3 Plan and states she is following her eating plan approximately 80% of the time. Marilyn Martin states she is has not been exercising.  Today's visit was #: 2 Starting weight: 241 lbs Starting date: 01/11/22 Today's weight: 238 lbs Today's date: 01/25/22 Total lbs lost to date: 3 Total lbs lost since last in-office visit: -3  Interim History: She is down 3 pounds after her first visit.  She has been on vacation.  She is trying to get in her water.  Subjective:   1. Insulin resistance No medications. A1c 5.5, insulin is 37.4.  2. Fatty liver Normal liver enzymes.  No abdominal pain.  Assessment/Plan:   1. Insulin resistance 1.  Handout-insulin resistance and prediabetes 2.  Will keep carbohydrates low.  2. Fatty liver 1.  We will continue to work on diet and exercise.  3. Obesity, Current BMI 40.9 1.  Meal planning 2.  Intentional eating 3.  Reviewed labs 01/11/2022-CMP, A1c, insulin 4.  Increase water.  Marilyn Martin is currently in the action stage of change. As such, her goal is to continue with weight loss efforts. She has agreed to the Category 3 Plan.   Exercise goals: No exercise has been prescribed at this time.  Behavioral modification strategies: increasing lean protein intake, decreasing simple carbohydrates, increasing vegetables, increasing water intake, decreasing eating out, no skipping meals, meal planning and cooking strategies, keeping healthy foods in the home, and planning for success.  Marilyn Martin has agreed to follow-up with our clinic in 2 weeks with Dr. Valetta Close or Valetta Fuller. She was informed of the importance of frequent follow-up visits to maximize her success with intensive lifestyle modifications for her multiple health conditions.   Objective:   Blood pressure 125/80, pulse 79, temperature 97.7  F (36.5 C), height '5\' 4"'$  (1.626 m), weight 238 lb (108 kg), SpO2 97 %. Body mass index is 40.85 kg/m.  General: Cooperative, alert, well developed, in no acute distress. HEENT: Conjunctivae and lids unremarkable. Cardiovascular: Regular rhythm.  Lungs: Normal work of breathing. Neurologic: No focal deficits.   Lab Results  Component Value Date   CREATININE 0.79 01/11/2022   BUN 17 01/11/2022   NA 143 01/11/2022   K 4.3 01/11/2022   CL 104 01/11/2022   CO2 22 01/11/2022   Lab Results  Component Value Date   ALT 23 01/11/2022   AST 17 01/11/2022   ALKPHOS 74 01/11/2022   BILITOT 0.4 01/11/2022   Lab Results  Component Value Date   HGBA1C 5.5 01/11/2022   Lab Results  Component Value Date   INSULIN 37.4 (H) 01/11/2022   Lab Results  Component Value Date   TSH 2.141 04/09/2011   Lab Results  Component Value Date   CHOL 136 04/09/2011   HDL 40 04/09/2011   LDLCALC 82 04/09/2011   TRIG 72 04/09/2011   CHOLHDL 3.4 04/09/2011   No results found for: "VD25OH" Lab Results  Component Value Date   WBC 7.9 05/15/2016   HGB 13.5 05/15/2016   HCT 38.6 05/15/2016   MCV 88.3 05/15/2016   PLT 208 05/15/2016   No results found for: "IRON", "TIBC", "FERRITIN"    Attestation Statements:   Reviewed by clinician on day of visit: allergies, medications, problem list, medical history, surgical history, family history, social history, and previous encounter notes.  I,  Dawn [employer], FNP-C, am acting as Location manager for Dr. Jearld Lesch.  I have reviewed the above documentation for accuracy and completeness, and I agree with the above. Jearld Lesch, DO

## 2022-02-15 ENCOUNTER — Ambulatory Visit (INDEPENDENT_AMBULATORY_CARE_PROVIDER_SITE_OTHER): Payer: 59 | Admitting: Nurse Practitioner

## 2022-02-15 ENCOUNTER — Encounter (INDEPENDENT_AMBULATORY_CARE_PROVIDER_SITE_OTHER): Payer: Self-pay | Admitting: Nurse Practitioner

## 2022-02-15 VITALS — BP 114/65 | HR 77 | Temp 98.3°F | Ht 64.0 in | Wt 240.0 lb

## 2022-02-15 DIAGNOSIS — Z6841 Body Mass Index (BMI) 40.0 and over, adult: Secondary | ICD-10-CM | POA: Diagnosis not present

## 2022-02-15 DIAGNOSIS — E8881 Metabolic syndrome: Secondary | ICD-10-CM

## 2022-02-15 DIAGNOSIS — E669 Obesity, unspecified: Secondary | ICD-10-CM

## 2022-02-19 NOTE — Progress Notes (Signed)
Chief Complaint:   OBESITY Abbagayle is here to discuss her progress with her obesity treatment plan along with follow-up of her obesity related diagnoses. Mariena is on the Category 3 Plan and states she is following her eating plan approximately 75% of the time. Genia states she is exercising 0 minutes 0 times per week.  Today's visit was #: 3 Starting weight: 241 lbs Starting date: 01/11/2022 Today's weight: 240 lbs Today's date: 02/15/2022 Total lbs lost to date: 1 lb Total lbs lost since last in-office visit: 0  Interim History:  Nyeshia is working 12 hour days. Notes a lot of stress at work and has been stress eating. She does not eat a lot during the day. Eating more when she get home. Missing lunch at work. She is s/p SG in 2017 with Dr. Hassell Done. Highest weight 275 lbs and Nadir weight 200 lbs. She is drinking a protein shake, water, coffee and tea. Denies sodas. She has a hard time following plan due to work schedule.    Subjective:   1. Insulin resistance Amy has never been on medication. Struggles with cravings.  Assessment/Plan:   1. Insulin resistance Information given for IR and Metformin.  Skylin will continue to work on weight loss, exercise, and decreasing simple carbohydrates to help decrease the risk of diabetes. Leora agreed to follow-up with [place] as directed to closely monitor her progress.   2. Obesity, Current BMI 41.2 Jonte is currently in the action stage of change. As such, her goal is to continue with weight loss efforts. She has agreed to keeping a food journal and adhering to recommended goals of 1500 calories and 90+ grams of protein.   Exercise goals: All adults should avoid inactivity. Some physical activity is better than none, and adults who participate in any amount of physical activity gain some health benefits.  Behavioral modification strategies: increasing lean protein intake, increasing vegetables, and increasing water intake.  Chonita has agreed to  follow-up with our clinic in 4 weeks. She was informed of the importance of frequent follow-up visits to maximize her success with intensive lifestyle modifications for her multiple health conditions.   Objective:   Blood pressure 114/65, pulse 77, temperature 98.3 F (36.8 C), height '5\' 4"'$  (1.626 m), weight 240 lb (108.9 kg), SpO2 98 %. Body mass index is 41.2 kg/m.  General: Cooperative, alert, well developed, in no acute distress. HEENT: Conjunctivae and lids unremarkable. Cardiovascular: Regular rhythm.  Lungs: Normal work of breathing. Neurologic: No focal deficits.   Lab Results  Component Value Date   CREATININE 0.79 01/11/2022   BUN 17 01/11/2022   NA 143 01/11/2022   K 4.3 01/11/2022   CL 104 01/11/2022   CO2 22 01/11/2022   Lab Results  Component Value Date   ALT 23 01/11/2022   AST 17 01/11/2022   ALKPHOS 74 01/11/2022   BILITOT 0.4 01/11/2022   Lab Results  Component Value Date   HGBA1C 5.5 01/11/2022   Lab Results  Component Value Date   INSULIN 37.4 (H) 01/11/2022   Lab Results  Component Value Date   TSH 2.141 04/09/2011   Lab Results  Component Value Date   CHOL 136 04/09/2011   HDL 40 04/09/2011   LDLCALC 82 04/09/2011   TRIG 72 04/09/2011   CHOLHDL 3.4 04/09/2011   No results found for: "VD25OH" Lab Results  Component Value Date   WBC 7.9 05/15/2016   HGB 13.5 05/15/2016   HCT 38.6 05/15/2016  MCV 88.3 05/15/2016   PLT 208 05/15/2016   No results found for: "IRON", "TIBC", "FERRITIN"  Attestation Statements:   Reviewed by clinician on day of visit: allergies, medications, problem list, medical history, surgical history, family history, social history, and previous encounter notes.  Time spent on visit including pre-visit chart review and post-visit care and charting was 30 minutes.   I, Brendell Tyus, RMA, am acting as transcriptionist for Everardo Pacific, FNP.  I have reviewed the above documentation for accuracy and  completeness, and I agree with the above. Everardo Pacific, FNP

## 2022-03-15 ENCOUNTER — Ambulatory Visit (INDEPENDENT_AMBULATORY_CARE_PROVIDER_SITE_OTHER): Payer: 59 | Admitting: Family Medicine

## 2022-03-27 DIAGNOSIS — Z789 Other specified health status: Secondary | ICD-10-CM | POA: Diagnosis not present

## 2022-03-27 DIAGNOSIS — L0390 Cellulitis, unspecified: Secondary | ICD-10-CM | POA: Diagnosis not present

## 2022-03-27 DIAGNOSIS — R21 Rash and other nonspecific skin eruption: Secondary | ICD-10-CM | POA: Diagnosis not present

## 2022-03-27 DIAGNOSIS — Z299 Encounter for prophylactic measures, unspecified: Secondary | ICD-10-CM | POA: Diagnosis not present

## 2022-03-28 ENCOUNTER — Encounter (INDEPENDENT_AMBULATORY_CARE_PROVIDER_SITE_OTHER): Payer: Self-pay | Admitting: *Deleted

## 2022-05-02 ENCOUNTER — Ambulatory Visit (INDEPENDENT_AMBULATORY_CARE_PROVIDER_SITE_OTHER): Payer: 59 | Admitting: Family Medicine

## 2022-05-16 ENCOUNTER — Ambulatory Visit (INDEPENDENT_AMBULATORY_CARE_PROVIDER_SITE_OTHER): Payer: 59 | Admitting: Family Medicine

## 2022-06-07 DIAGNOSIS — M542 Cervicalgia: Secondary | ICD-10-CM | POA: Diagnosis not present

## 2022-06-07 DIAGNOSIS — R519 Headache, unspecified: Secondary | ICD-10-CM | POA: Diagnosis not present

## 2022-06-14 DIAGNOSIS — R0609 Other forms of dyspnea: Secondary | ICD-10-CM | POA: Diagnosis not present

## 2022-06-14 DIAGNOSIS — U099 Post covid-19 condition, unspecified: Secondary | ICD-10-CM | POA: Diagnosis not present

## 2022-06-14 DIAGNOSIS — R0600 Dyspnea, unspecified: Secondary | ICD-10-CM | POA: Diagnosis not present

## 2022-07-03 ENCOUNTER — Ambulatory Visit (HOSPITAL_COMMUNITY)
Admission: RE | Admit: 2022-07-03 | Discharge: 2022-07-03 | Disposition: A | Discharge status: Home / Self Care | Payer: 59 | Source: Ambulatory Visit | Attending: Internal Medicine | Admitting: Internal Medicine

## 2022-07-03 ENCOUNTER — Other Ambulatory Visit (HOSPITAL_COMMUNITY): Payer: Self-pay | Admitting: Internal Medicine

## 2022-07-03 DIAGNOSIS — R059 Cough, unspecified: Secondary | ICD-10-CM | POA: Diagnosis not present

## 2022-07-10 ENCOUNTER — Other Ambulatory Visit (HOSPITAL_BASED_OUTPATIENT_CLINIC_OR_DEPARTMENT_OTHER): Payer: Self-pay | Admitting: Internal Medicine

## 2022-07-10 DIAGNOSIS — R9389 Abnormal findings on diagnostic imaging of other specified body structures: Secondary | ICD-10-CM

## 2022-07-11 ENCOUNTER — Other Ambulatory Visit (HOSPITAL_COMMUNITY)
Admission: RE | Admit: 2022-07-11 | Discharge: 2022-07-11 | Disposition: A | Discharge status: Home / Self Care | Payer: 59 | Source: Ambulatory Visit | Attending: Cardiology | Admitting: Cardiology

## 2022-07-11 DIAGNOSIS — R918 Other nonspecific abnormal finding of lung field: Secondary | ICD-10-CM | POA: Diagnosis not present

## 2022-07-11 LAB — COMPREHENSIVE METABOLIC PANEL
ALT: 32 U/L (ref 0–44)
AST: 28 U/L (ref 15–41)
Albumin: 4.1 g/dL (ref 3.5–5.0)
Alkaline Phosphatase: 70 U/L (ref 38–126)
Anion gap: 8 (ref 5–15)
BUN: 17 mg/dL (ref 8–23)
CO2: 25 mmol/L (ref 22–32)
Calcium: 9.1 mg/dL (ref 8.9–10.3)
Chloride: 106 mmol/L (ref 98–111)
Creatinine, Ser: 0.86 mg/dL (ref 0.44–1.00)
GFR, Estimated: >60 mL/min (ref >60)
Glucose, Bld: 101 mg/dL — ABNORMAL HIGH (ref 70–99)
Potassium: 3.8 mmol/L (ref 3.5–5.1)
Sodium: 139 mmol/L (ref 135–145)
Total Bilirubin: 0.6 mg/dL (ref 0.3–1.2)
Total Protein: 7.1 g/dL (ref 6.5–8.1)

## 2022-07-12 ENCOUNTER — Ambulatory Visit (HOSPITAL_COMMUNITY)
Admission: RE | Admit: 2022-07-12 | Discharge: 2022-07-12 | Disposition: A | Discharge status: Home / Self Care | Payer: 59 | Source: Ambulatory Visit | Attending: Internal Medicine | Admitting: Internal Medicine

## 2022-07-12 DIAGNOSIS — R9389 Abnormal findings on diagnostic imaging of other specified body structures: Secondary | ICD-10-CM | POA: Insufficient documentation

## 2022-07-12 DIAGNOSIS — J9811 Atelectasis: Secondary | ICD-10-CM | POA: Diagnosis not present

## 2022-07-12 MED ORDER — IOHEXOL 300 MG/ML  SOLN
75.0000 mL | Freq: Once | INTRAMUSCULAR | Status: AC | PRN
  Administered 2022-07-12: 75 mL via INTRAVENOUS

## 2022-07-25 ENCOUNTER — Encounter: Payer: Self-pay | Admitting: Pulmonary Disease

## 2022-07-25 ENCOUNTER — Ambulatory Visit: Payer: 59 | Admitting: Pulmonary Disease

## 2022-07-25 VITALS — BP 122/68 | HR 92 | Ht 64.0 in | Wt 239.4 lb

## 2022-07-25 DIAGNOSIS — G4733 Obstructive sleep apnea (adult) (pediatric): Secondary | ICD-10-CM

## 2022-07-25 DIAGNOSIS — J986 Disorders of diaphragm: Secondary | ICD-10-CM

## 2022-07-25 DIAGNOSIS — R0609 Other forms of dyspnea: Secondary | ICD-10-CM

## 2022-07-25 NOTE — Patient Instructions (Signed)
Will arrange for overnight oximetry   Follow up in 6 weeks

## 2022-07-25 NOTE — Progress Notes (Signed)
Pulmonary, Critical Care, and Sleep Medicine  Chief Complaint  Patient presents with   Consult    Had covid in January and has had issues with her breathing since then.     Past Surgical History:  She  has a past surgical history that includes Cesarean section (1987); Colonoscopy with esophagogastroduodenoscopy (egd) (04/25/2012); maloney dilation (04/25/2012); Cholecystectomy (N/A, 07/31/2013); Liver biopsy (N/A, 07/31/2013); Cystoscopy with retrograde pyelogram, ureteroscopy and stent placement (Left, 07/25/2015); Holmium laser application (Left, 123456); transthoracic echocardiogram (12-26-2012); Cardiovascular stress test (12-26-2012); Dilation and curettage of uterus (1986); Tonsillectomy (1967); Cystoscopy/retrograde/ureteroscopy/stone extraction with basket (Left, 08/08/2015); Cystoscopy w/ ureteral stent placement (Left, 08/08/2015); Holmium laser application (Left, 123XX123); Tubal ligation; and Laparoscopic gastric sleeve resection with hiatal hernia repair (N/A, 05/14/2016).  Past Medical History:  Anxiety, OA, Fatty liver, GERD, HA, HLD, Colon polyp, Nephrolithiasis, IBS, Vertigo, Vit D deficiency  Constitutional:  BP 122/68 (BP Location: Left Arm, Patient Position: Sitting)   Pulse 92   Ht 5' 4"$  (1.626 m)   Wt 239 lb 6.4 oz (108.6 kg)   SpO2 96% Comment: ra  BMI 41.09 kg/m   Brief Summary:  Marilyn Martin is a 62 y.o. female with dyspnea.      Subjective:   She was previously seen by Dr. Annamaria Boots for sleep apnea.  She tried CPAP, but was not able to tolerate this.  She hasn't used CPAP for about 4 or 5 years.  She also tried an oral appliance, but this started to cause teeth misalignment and TMJ discomfort.  She had COVID infection at the beginning of January 2024.  Prior to this she didn't have any trouble with her breathing.  About a week after having COVID she felt like she could get enough air into her lungs.  This would happen when walking, bending, going up  stairs, or laying down.  She tried several inhalers w/o improvement and was treated with antibiotics for possible pneumonia.  She had a chest xray and then CT chest that showed marked elevation of her right diaphragm with compressive atelectasis.    She has very remote history of smoking.  No prior history of asthma, or pneumonia.  She has not had any recent neck injury, neck/chest surgery, or chiropractic manipulation.  Her only other chest imaging in out system from August 2017 showed normal right diaphragm position.  Physical Exam:   Appearance - well kempt   ENMT - no sinus tenderness, no oral exudate, no LAN, Mallampati 3 airway, no stridor  Respiratory - decreased breath sounds right base, no wheezing or rales  CV - s1s2 regular rate and rhythm, no murmurs  Ext - no clubbing, no edema  Skin - no rashes  Psych - normal mood and affect   Pulmonary testing:    Chest Imaging:  CT chest 07/14/22 >> elevated Rt hemidiaphragm, fatty liver  Sleep Tests:  PSG 04/06/16 >> AHI 17.4, SpO2 low 85%  Cardiac Tests:    Social History:  She  reports that she quit smoking about 41 years ago. Her smoking use included cigarettes. She has a 2.00 pack-year smoking history. She has never used smokeless tobacco. She reports current alcohol use. She reports that she does not use drugs.  Family History:  Her family history includes Alzheimer's disease in her mother and sister; Cancer in her mother; Colon cancer in her paternal uncle; Diabetes in her brother and father; Heart attack in her father; Heart disease in her father; High Cholesterol in her father; High  blood pressure in her father; Stroke in her father and mother; Sudden death in her mother; Thyroid disease in her mother.    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  She reports slow subjective  improvement over the past couple of weeks, but clinical exam findings are consistent with her right diaphragm still being elevated.  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 - discussed how this can impact her activity level and function - reviewed how this can impact her breathing while asleep especially with prior history of sleep apnea - discussed how is some situations the phrenic nerve can regenerate function, but in other situations this can be a permanent issue - will arrange for overnight oximetry room air  - discussed arrange for Sniff test and PFT; decided to give more time to see if she has improvement before proceeding with these tests  Time Spent Involved in Patient Care on Day of Examination:  47 minutes  Follow up:   Patient Instructions  Will arrange for overnight oximetry   Follow up in 6 weeks  Medication List:   Allergies as of 07/25/2022       Reactions   Bee Venom Swelling   Macrobid [nitrofurantoin] Rash        Medication List        Accurate as of July 25, 2022  3:57 PM. If you have any questions, ask your nurse or doctor.          STOP taking these medications    ciprofloxacin 500 MG tablet Commonly known as: CIPRO Stopped by: Chesley Mires, MD   Klor-Con M10 10 MEQ tablet Generic drug: potassium chloride Stopped by: Chesley Mires, MD   levofloxacin 500 MG tablet Commonly known as: Levaquin Stopped by: Chesley Mires, MD   raloxifene 60 MG tablet Commonly known as: EVISTA Stopped by: Chesley Mires, MD       TAKE these medications    acetaminophen 500 MG tablet Commonly known as: TYLENOL Take 1,000 mg by mouth every 6 (six) hours as needed for headache.   BIOTIN PO Take 1 tablet by mouth daily.   Calcium 500 MG Chew Chew 1,500 mg by mouth daily.   loratadine 10 MG tablet Commonly known as: CLARITIN Take 10 mg by mouth  every morning.   torsemide 10 MG tablet Commonly known as: DEMADEX Take 1 tablet (10 mg total) by mouth daily as needed. What changed: Another medication with the same name was removed. Continue taking this medication, and follow the directions you see here. Changed by: Chesley Mires, MD   Vitamin D (Ergocalciferol) 1.25 MG (50000 UNIT) Caps capsule Commonly known as: DRISDOL Take 1 capsule (50,000 Units total) by mouth 2 (two) times a week.        Signature:  Chesley Mires, MD [place] Pager - [phone] 07/25/2022, 3:57 PM

## 2022-08-17 DIAGNOSIS — N390 Urinary tract infection, site not specified: Secondary | ICD-10-CM | POA: Diagnosis not present

## 2022-08-17 DIAGNOSIS — Z1231 Encounter for screening mammogram for malignant neoplasm of breast: Secondary | ICD-10-CM | POA: Diagnosis not present

## 2022-08-17 DIAGNOSIS — Z01419 Encounter for gynecological examination (general) (routine) without abnormal findings: Secondary | ICD-10-CM | POA: Diagnosis not present

## 2022-08-17 DIAGNOSIS — Z6841 Body Mass Index (BMI) 40.0 and over, adult: Secondary | ICD-10-CM | POA: Diagnosis not present

## 2022-08-21 DIAGNOSIS — R0602 Shortness of breath: Secondary | ICD-10-CM | POA: Diagnosis not present

## 2022-08-22 ENCOUNTER — Telehealth: Payer: Self-pay | Admitting: Pulmonary Disease

## 2022-08-22 NOTE — Telephone Encounter (Signed)
ONO with RA 08/19/22 >> test time 7 hrs 25 min.  Basal SpO2 92.7%, low SpO2 84%.  Spent 3 min 19 sec with SpO2 < 88%.   Please let her know her oxygen test looks okay.  She doesn't need supplemental oxygen at night.

## 2022-08-22 NOTE — Telephone Encounter (Signed)
ATC patient  Left detailed vm (ok per dpr) with results and advised on vm to call back for any questions or concerns.  Nothing further needed at this time.

## 2022-09-04 ENCOUNTER — Encounter: Payer: Self-pay | Admitting: Pulmonary Disease

## 2022-09-04 ENCOUNTER — Ambulatory Visit: Payer: 59 | Admitting: Pulmonary Disease

## 2022-09-04 ENCOUNTER — Other Ambulatory Visit (HOSPITAL_COMMUNITY): Payer: Self-pay

## 2022-09-04 VITALS — BP 130/68 | HR 96 | Ht 64.0 in | Wt 237.4 lb

## 2022-09-04 DIAGNOSIS — R0609 Other forms of dyspnea: Secondary | ICD-10-CM

## 2022-09-04 DIAGNOSIS — R0683 Snoring: Secondary | ICD-10-CM | POA: Diagnosis not present

## 2022-09-04 DIAGNOSIS — J986 Disorders of diaphragm: Secondary | ICD-10-CM

## 2022-09-04 MED ORDER — ALBUTEROL SULFATE HFA 108 (90 BASE) MCG/ACT IN AERS: 1.0000 | INHALATION_SPRAY | Freq: Four times a day (QID) | RESPIRATORY_TRACT | 1 refills | Status: DC | PRN

## 2022-09-04 MED ORDER — ALBUTEROL SULFATE HFA 108 (90 BASE) MCG/ACT IN AERS
1.0000 | INHALATION_SPRAY | Freq: Four times a day (QID) | RESPIRATORY_TRACT | 1 refills | Status: DC | PRN
  Filled 2022-09-04: qty 13.4, fill #0

## 2022-09-04 NOTE — Addendum Note (Signed)
Addended by: Fritzi Mandes D on: 09/04/2022 12:05 PM   Modules accepted: Orders

## 2022-09-04 NOTE — Patient Instructions (Signed)
Albuterol 1 to 2 puffs every 6 hours as needed for cough, wheeze, chest congestion, or shortness of breath  Will schedule Sniff test and pulmonary function test at [facility]  Will arrange for a home sleep study  Will call to arrange for follow up after sleep study reviewed

## 2022-09-04 NOTE — Progress Notes (Signed)
[facility], Critical [facility], and Sleep [facility]  Chief Complaint  Patient presents with   Follow-up    Feeling a little better  Is aware of ONO results left on vm     Past Surgical History:  She  has a past surgical history that includes Cesarean section (1987); Colonoscopy with esophagogastroduodenoscopy (egd) (04/25/2012); maloney dilation (04/25/2012); Cholecystectomy (N/A, 07/31/2013); Liver biopsy (N/A, 07/31/2013); Cystoscopy with retrograde pyelogram, ureteroscopy and stent placement (Left, 07/25/2015); Holmium laser application (Left, 123456); transthoracic echocardiogram (12-26-2012); Cardiovascular stress test (12-26-2012); Dilation and curettage of uterus (1986); Tonsillectomy (1967); Cystoscopy/retrograde/ureteroscopy/stone extraction with basket (Left, 08/08/2015); Cystoscopy w/ ureteral stent placement (Left, 08/08/2015); Holmium laser application (Left, 123XX123); Tubal ligation; and Laparoscopic gastric sleeve resection with hiatal hernia repair (N/A, 05/14/2016).  Past Medical History:  Anxiety, OA, Fatty liver, GERD, HA, HLD, Colon polyp, Nephrolithiasis, IBS, Vertigo, Vit D deficiency  Constitutional:  BP 130/68 (BP Location: Left Arm, Patient Position: Sitting)   Pulse 96   Ht 5\' 4"  (1.626 m)   Wt 237 lb 6.4 oz (107.7 kg)   SpO2 95% Comment: ra  BMI 40.75 kg/m   Brief Summary:  Marilyn Martin is a 62 y.o. female with dyspnea.  She has right diaphragm dysfunction after COVID infection in January 2024 and obstructive sleep apnea.      Subjective:   She continues to struggle with activity.  She had trouble walking up a small hill at work, and had to go around the building to more level ground.  She still gets very short of breath when she bends over.  She has some sinus congestion and chest congestion, and gets a cough intermittently.  Physical Exam:   Appearance - well kempt   ENMT - no sinus tenderness, no oral exudate, no LAN, Mallampati 3 airway, no  stridor  Respiratory - decreased breath sounds right base, no wheezing or rales  CV - s1s2 regular rate and rhythm, no murmurs  Ext - no clubbing, no edema  Skin - no rashes  Psych - normal mood and affect   Pulmonary testing:    Chest Imaging:  CT chest 07/14/22 >> elevated Rt hemidiaphragm, fatty liver  Sleep Tests:  PSG 04/06/16 >> AHI 17.4, SpO2 low 85% ONO with RA 08/19/22 >> test time 7 hrs 25 min. Basal SpO2 92.7%, low SpO2 84%. Spent 3 min 19 sec with SpO2 < 88%.   Cardiac Tests:    Social History:  She  reports that she quit smoking about 41 years ago. Her smoking use included cigarettes. She has a 2.00 pack-year smoking history. She has never used smokeless tobacco. She reports current alcohol use. She reports that she does not use drugs.  Family History:  Her family history includes Alzheimer's disease in her mother and sister; Cancer in her mother; Colon cancer in her paternal uncle; Diabetes in her brother and father; Heart attack in her father; Heart disease in her father; High Cholesterol in her father; High blood pressure in her father; Stroke in her father and mother; Sudden death in her mother; Thyroid disease in her mother.     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 - will arrange for Sniff test and PFT to assess further - she might have a component of asthma/allergies >> will have her try albuterol prn  Snoring with history of obstructive sleep apnea. - explained how this can progress with weight gain and  sleep disordered breathing can be exacerbated by diaphragm dysfunction  - will arrange for a home sleep study to assess further  Time Spent Involved in Patient Care on Day of Examination:  36 minutes  Follow up:   Patient Instructions  Albuterol 1 to 2 puffs every 6 hours as needed for cough, wheeze, chest congestion, or shortness of  breath  Will schedule Sniff test and pulmonary function test at [facility]  Will arrange for a home sleep study  Will call to arrange for follow up after sleep study reviewed   Medication List:   Allergies as of 09/04/2022       Reactions   Bee Venom Swelling   Macrobid [nitrofurantoin] Rash        Medication List        Accurate as of September 04, 2022 11:59 AM. If you have any questions, ask your nurse or doctor.          STOP taking these medications    BIOTIN PO Stopped by: Chesley Mires, MD   Calcium 500 MG Chew Stopped by: Chesley Mires, MD   Vitamin D (Ergocalciferol) 1.25 MG (50000 UNIT) Caps capsule Commonly known as: DRISDOL Stopped by: Chesley Mires, MD       TAKE these medications    acetaminophen 500 MG tablet Commonly known as: TYLENOL Take 1,000 mg by mouth every 6 (six) hours as needed for headache.   albuterol 108 (90 Base) MCG/ACT inhaler Commonly known as: VENTOLIN HFA Inhale 1-2 puffs into the lungs every 6 (six) hours as needed for wheezing or shortness of breath. Started by: Chesley Mires, MD   loratadine 10 MG tablet Commonly known as: CLARITIN Take 10 mg by mouth every morning.   torsemide 10 MG tablet Commonly known as: DEMADEX Take 1 tablet (10 mg total) by mouth daily as needed.        Signature:  Chesley Mires, MD [place] Pager - [phone] 09/04/2022, 11:59 AM

## 2022-09-05 ENCOUNTER — Encounter: Payer: Self-pay | Admitting: Pulmonary Disease

## 2022-09-25 ENCOUNTER — Other Ambulatory Visit: Payer: Self-pay

## 2022-09-26 ENCOUNTER — Other Ambulatory Visit (HOSPITAL_COMMUNITY): Payer: Self-pay

## 2022-09-27 ENCOUNTER — Other Ambulatory Visit (HOSPITAL_COMMUNITY): Payer: Self-pay

## 2022-10-01 ENCOUNTER — Ambulatory Visit (HOSPITAL_COMMUNITY)
Admission: RE | Admit: 2022-10-01 | Discharge: 2022-10-01 | Disposition: A | Discharge status: Home / Self Care | Payer: 59 | Source: Ambulatory Visit | Attending: Pulmonary Disease | Admitting: Pulmonary Disease

## 2022-10-01 DIAGNOSIS — J986 Disorders of diaphragm: Secondary | ICD-10-CM

## 2022-10-05 ENCOUNTER — Other Ambulatory Visit (HOSPITAL_COMMUNITY): Payer: Self-pay

## 2022-10-11 ENCOUNTER — Other Ambulatory Visit (HOSPITAL_COMMUNITY): Payer: Self-pay

## 2022-10-12 ENCOUNTER — Ambulatory Visit (HOSPITAL_COMMUNITY): Admission: RE | Admit: 2022-10-12 | Payer: 59 | Source: Ambulatory Visit

## 2022-10-12 ENCOUNTER — Encounter (HOSPITAL_COMMUNITY): Payer: Self-pay

## 2022-10-12 ENCOUNTER — Other Ambulatory Visit: Payer: Self-pay

## 2022-10-12 ENCOUNTER — Ambulatory Visit (HOSPITAL_COMMUNITY)
Admission: RE | Admit: 2022-10-12 | Discharge: 2022-10-12 | Disposition: A | Discharge status: Home / Self Care | Payer: 59 | Source: Ambulatory Visit | Attending: Pulmonary Disease | Admitting: Pulmonary Disease

## 2022-10-12 ENCOUNTER — Other Ambulatory Visit (HOSPITAL_COMMUNITY): Payer: Self-pay

## 2022-10-12 DIAGNOSIS — R0609 Other forms of dyspnea: Secondary | ICD-10-CM | POA: Diagnosis not present

## 2022-10-12 LAB — PULMONARY FUNCTION TEST
DL/VA % pred: 143 %
DL/VA: 5.95 ml/min/mmHg/L
DLCO unc % pred: 61 %
DLCO unc: 12.96 ml/min/mmHg
FEF 25-75 Post: 1.67 L/sec
FEF 25-75 Pre: 1.93 L/sec
FEF2575-%Change-Post: -13 %
FEF2575-%Pred-Post: 72 %
FEF2575-%Pred-Pre: 83 %
FEV1-%Change-Post: 5 %
FEV1-%Pred-Post: 49 %
FEV1-%Pred-Pre: 47 %
FEV1-Post: 1.31 L
FEV1-Pre: 1.24 L
FEV1FVC-%Change-Post: 3 %
FEV1FVC-%Pred-Pre: 114 %
FEV6-%Change-Post: 2 %
FEV6-%Pred-Post: 43 %
FEV6-%Pred-Pre: 42 %
FEV6-Post: 1.43 L
FEV6-Pre: 1.39 L
FEV6FVC-%Pred-Post: 103 %
FEV6FVC-%Pred-Pre: 103 %
FVC-%Change-Post: 2 %
FVC-%Pred-Post: 41 %
FVC-%Pred-Pre: 40 %
FVC-Post: 1.43 L
FVC-Pre: 1.39 L
Post FEV1/FVC ratio: 91 %
Post FEV6/FVC ratio: 100 %
Pre FEV1/FVC ratio: 89 %
Pre FEV6/FVC Ratio: 100 %
RV % pred: 126 %
RV: 2.68 L
TLC % pred: 75 %
TLC: 3.97 L

## 2022-10-12 MED ORDER — ALBUTEROL SULFATE (2.5 MG/3ML) 0.083% IN NEBU
2.5000 mg | INHALATION_SOLUTION | Freq: Once | RESPIRATORY_TRACT | Status: AC
  Administered 2022-10-12: 2.5 mg via RESPIRATORY_TRACT

## 2022-10-17 ENCOUNTER — Other Ambulatory Visit (HOSPITAL_COMMUNITY): Payer: Self-pay

## 2022-10-17 DIAGNOSIS — Z299 Encounter for prophylactic measures, unspecified: Secondary | ICD-10-CM | POA: Diagnosis not present

## 2022-10-17 DIAGNOSIS — G4730 Sleep apnea, unspecified: Secondary | ICD-10-CM | POA: Diagnosis not present

## 2022-10-17 DIAGNOSIS — F419 Anxiety disorder, unspecified: Secondary | ICD-10-CM | POA: Diagnosis not present

## 2022-10-17 DIAGNOSIS — J984 Other disorders of lung: Secondary | ICD-10-CM | POA: Diagnosis not present

## 2022-10-17 DIAGNOSIS — J986 Disorders of diaphragm: Secondary | ICD-10-CM | POA: Diagnosis not present

## 2022-10-17 MED ORDER — BUSPIRONE HCL 5 MG PO TABS
5.0000 mg | ORAL_TABLET | Freq: Two times a day (BID) | ORAL | 3 refills | Status: DC
  Filled 2022-10-17 – 2022-10-19 (×2): qty 60, 30d supply, fill #0
  Filled 2022-12-27: qty 60, 30d supply, fill #1
  Filled 2023-03-26: qty 60, 30d supply, fill #2
  Filled 2023-04-23: qty 60, 30d supply, fill #3

## 2022-10-19 ENCOUNTER — Other Ambulatory Visit: Payer: Self-pay

## 2022-10-19 ENCOUNTER — Other Ambulatory Visit (HOSPITAL_COMMUNITY): Payer: Self-pay

## 2022-10-26 DIAGNOSIS — Z6841 Body Mass Index (BMI) 40.0 and over, adult: Secondary | ICD-10-CM | POA: Diagnosis not present

## 2022-10-26 DIAGNOSIS — Z299 Encounter for prophylactic measures, unspecified: Secondary | ICD-10-CM | POA: Diagnosis not present

## 2022-10-26 DIAGNOSIS — H5711 Ocular pain, right eye: Secondary | ICD-10-CM | POA: Diagnosis not present

## 2022-10-31 ENCOUNTER — Telehealth: Payer: Self-pay | Admitting: Pulmonary Disease

## 2022-10-31 NOTE — Telephone Encounter (Signed)
Pt states she missed a call from [place] in regard to her PFT results

## 2022-11-02 NOTE — Telephone Encounter (Signed)
ATC patient. Per DPR, left detailed vm letting pt know what her results were from the PFT. Advised patient to give [place] a call if she had any questions.   Nothing further needed.

## 2022-11-14 ENCOUNTER — Telehealth: Payer: Self-pay

## 2022-11-14 NOTE — Telephone Encounter (Signed)
Noted  

## 2022-11-14 NOTE — Telephone Encounter (Signed)
Called and spoke w/ pt she verbalized that she is trying to catch up on her medical bills currently and is wanting to hold off on the HST until she can afford it.  Pt reports that she wanted Dr.Sood to know she has not ignored his recommendations and will call us to schedule HST when she is financially able.   Routing to VS as FYI.

## 2022-11-29 ENCOUNTER — Other Ambulatory Visit (HOSPITAL_COMMUNITY): Payer: Self-pay

## 2022-11-29 DIAGNOSIS — E559 Vitamin D deficiency, unspecified: Secondary | ICD-10-CM | POA: Diagnosis not present

## 2022-11-29 DIAGNOSIS — Z79899 Other long term (current) drug therapy: Secondary | ICD-10-CM | POA: Diagnosis not present

## 2022-11-29 DIAGNOSIS — R739 Hyperglycemia, unspecified: Secondary | ICD-10-CM | POA: Diagnosis not present

## 2022-11-29 DIAGNOSIS — Z0000 Encounter for general adult medical examination without abnormal findings: Secondary | ICD-10-CM | POA: Diagnosis not present

## 2022-11-29 DIAGNOSIS — Z1331 Encounter for screening for depression: Secondary | ICD-10-CM | POA: Diagnosis not present

## 2022-11-29 DIAGNOSIS — F419 Anxiety disorder, unspecified: Secondary | ICD-10-CM | POA: Diagnosis not present

## 2022-11-29 DIAGNOSIS — Z299 Encounter for prophylactic measures, unspecified: Secondary | ICD-10-CM | POA: Diagnosis not present

## 2022-11-29 DIAGNOSIS — E7800 Pure hypercholesterolemia, unspecified: Secondary | ICD-10-CM | POA: Diagnosis not present

## 2022-11-29 MED ORDER — TORSEMIDE 10 MG PO TABS
10.0000 mg | ORAL_TABLET | Freq: Every day | ORAL | 1 refills | Status: AC | PRN
  Filled 2022-11-29 – 2022-12-27 (×2): qty 90, 90d supply, fill #0

## 2022-12-04 ENCOUNTER — Other Ambulatory Visit (HOSPITAL_COMMUNITY): Payer: Self-pay

## 2022-12-04 MED ORDER — VITAMIN D (ERGOCALCIFEROL) 1.25 MG (50000 UNIT) PO CAPS
50000.0000 [IU] | ORAL_CAPSULE | ORAL | 4 refills | Status: AC
  Filled 2022-12-04 – 2022-12-27 (×2): qty 25, 88d supply, fill #0
  Filled 2023-03-26: qty 25, 88d supply, fill #1
  Filled 2023-06-19: qty 25, 88d supply, fill #2
  Filled 2023-09-14: qty 25, 88d supply, fill #3

## 2022-12-05 DIAGNOSIS — R110 Nausea: Secondary | ICD-10-CM | POA: Diagnosis not present

## 2022-12-05 DIAGNOSIS — Z299 Encounter for prophylactic measures, unspecified: Secondary | ICD-10-CM | POA: Diagnosis not present

## 2022-12-05 DIAGNOSIS — R519 Headache, unspecified: Secondary | ICD-10-CM | POA: Diagnosis not present

## 2022-12-11 ENCOUNTER — Encounter (HOSPITAL_COMMUNITY): Payer: 59

## 2022-12-17 ENCOUNTER — Other Ambulatory Visit (HOSPITAL_COMMUNITY): Payer: Self-pay

## 2022-12-17 ENCOUNTER — Other Ambulatory Visit: Payer: Self-pay | Admitting: Pulmonary Disease

## 2022-12-27 ENCOUNTER — Encounter: Payer: Self-pay | Admitting: Pulmonary Disease

## 2022-12-27 ENCOUNTER — Other Ambulatory Visit (HOSPITAL_COMMUNITY): Payer: Self-pay

## 2022-12-27 ENCOUNTER — Encounter (HOSPITAL_COMMUNITY): Payer: Self-pay | Admitting: *Deleted

## 2023-01-09 ENCOUNTER — Other Ambulatory Visit: Payer: Self-pay | Admitting: Gastroenterology

## 2023-01-09 MED ORDER — AMOXICILLIN-POT CLAVULANATE 875-125 MG PO TABS: 1.0000 | ORAL_TABLET | Freq: Two times a day (BID) | ORAL | 0 refills | Status: AC

## 2023-03-20 ENCOUNTER — Inpatient Hospital Stay (HOSPITAL_COMMUNITY)
Admission: EM | Admit: 2023-03-20 | Discharge: 2023-03-24 | DRG: 854 | Disposition: A | Discharge status: Home / Self Care | Payer: 59 | Attending: Internal Medicine | Admitting: Internal Medicine

## 2023-03-20 ENCOUNTER — Encounter (HOSPITAL_COMMUNITY): Payer: Self-pay

## 2023-03-20 ENCOUNTER — Other Ambulatory Visit: Payer: Self-pay

## 2023-03-20 DIAGNOSIS — Z860102 Personal history of hyperplastic colon polyps: Secondary | ICD-10-CM

## 2023-03-20 DIAGNOSIS — Z91030 Bee allergy status: Secondary | ICD-10-CM

## 2023-03-20 DIAGNOSIS — E7800 Pure hypercholesterolemia, unspecified: Secondary | ICD-10-CM | POA: Diagnosis present

## 2023-03-20 DIAGNOSIS — E559 Vitamin D deficiency, unspecified: Secondary | ICD-10-CM | POA: Diagnosis present

## 2023-03-20 DIAGNOSIS — Z883 Allergy status to other anti-infective agents status: Secondary | ICD-10-CM

## 2023-03-20 DIAGNOSIS — F419 Anxiety disorder, unspecified: Secondary | ICD-10-CM | POA: Diagnosis present

## 2023-03-20 DIAGNOSIS — N200 Calculus of kidney: Secondary | ICD-10-CM | POA: Diagnosis not present

## 2023-03-20 DIAGNOSIS — M159 Polyosteoarthritis, unspecified: Secondary | ICD-10-CM | POA: Diagnosis present

## 2023-03-20 DIAGNOSIS — Z713 Dietary counseling and surveillance: Secondary | ICD-10-CM

## 2023-03-20 DIAGNOSIS — N136 Pyonephrosis: Secondary | ICD-10-CM | POA: Diagnosis present

## 2023-03-20 DIAGNOSIS — I959 Hypotension, unspecified: Secondary | ICD-10-CM | POA: Diagnosis not present

## 2023-03-20 DIAGNOSIS — Z79899 Other long term (current) drug therapy: Secondary | ICD-10-CM

## 2023-03-20 DIAGNOSIS — A4151 Sepsis due to Escherichia coli [E. coli]: Secondary | ICD-10-CM | POA: Diagnosis not present

## 2023-03-20 DIAGNOSIS — E872 Acidosis, unspecified: Secondary | ICD-10-CM | POA: Diagnosis present

## 2023-03-20 DIAGNOSIS — Z9049 Acquired absence of other specified parts of digestive tract: Secondary | ICD-10-CM

## 2023-03-20 DIAGNOSIS — Z87442 Personal history of urinary calculi: Secondary | ICD-10-CM

## 2023-03-20 DIAGNOSIS — A419 Sepsis, unspecified organism: Secondary | ICD-10-CM | POA: Diagnosis present

## 2023-03-20 DIAGNOSIS — R000 Tachycardia, unspecified: Secondary | ICD-10-CM | POA: Diagnosis not present

## 2023-03-20 DIAGNOSIS — K760 Fatty (change of) liver, not elsewhere classified: Secondary | ICD-10-CM | POA: Diagnosis present

## 2023-03-20 DIAGNOSIS — I10 Essential (primary) hypertension: Secondary | ICD-10-CM | POA: Diagnosis present

## 2023-03-20 DIAGNOSIS — R6520 Severe sepsis without septic shock: Secondary | ICD-10-CM | POA: Diagnosis present

## 2023-03-20 DIAGNOSIS — Z8249 Family history of ischemic heart disease and other diseases of the circulatory system: Secondary | ICD-10-CM

## 2023-03-20 DIAGNOSIS — K219 Gastro-esophageal reflux disease without esophagitis: Secondary | ICD-10-CM | POA: Diagnosis present

## 2023-03-20 DIAGNOSIS — Z6841 Body Mass Index (BMI) 40.0 and over, adult: Secondary | ICD-10-CM

## 2023-03-20 DIAGNOSIS — Z87891 Personal history of nicotine dependence: Secondary | ICD-10-CM

## 2023-03-20 DIAGNOSIS — G4730 Sleep apnea, unspecified: Secondary | ICD-10-CM | POA: Diagnosis present

## 2023-03-20 LAB — COMPREHENSIVE METABOLIC PANEL
ALT: 20 U/L (ref 0–44)
AST: 17 U/L (ref 15–41)
Albumin: 4.2 g/dL (ref 3.5–5.0)
Alkaline Phosphatase: 66 U/L (ref 38–126)
Anion gap: 12 (ref 5–15)
BUN: 16 mg/dL (ref 8–23)
CO2: 24 mmol/L (ref 22–32)
Calcium: 9.1 mg/dL (ref 8.9–10.3)
Chloride: 101 mmol/L (ref 98–111)
Creatinine, Ser: 1 mg/dL (ref 0.44–1.00)
GFR, Estimated: >60 mL/min (ref >60)
Glucose, Bld: 128 mg/dL — ABNORMAL HIGH (ref 70–99)
Potassium: 3.9 mmol/L (ref 3.5–5.1)
Sodium: 137 mmol/L (ref 135–145)
Total Bilirubin: 0.8 mg/dL (ref 0.3–1.2)
Total Protein: 7.8 g/dL (ref 6.5–8.1)

## 2023-03-20 LAB — LIPASE, BLOOD: Lipase: 34 U/L (ref 11–51)

## 2023-03-20 LAB — CBC
HCT: 46.8 % — ABNORMAL HIGH (ref 36.0–46.0)
Hemoglobin: 15.8 g/dL — ABNORMAL HIGH (ref 12.0–15.0)
MCH: 30.7 pg (ref 26.0–34.0)
MCHC: 33.8 g/dL (ref 30.0–36.0)
MCV: 90.9 fL (ref 80.0–100.0)
Platelets: 229 10*3/uL (ref 150–400)
RBC: 5.15 MIL/uL — ABNORMAL HIGH (ref 3.87–5.11)
RDW: 13 % (ref 11.5–15.5)
WBC: 12.6 10*3/uL — ABNORMAL HIGH (ref 4.0–10.5)
nRBC: 0 % (ref 0.0–0.2)

## 2023-03-20 NOTE — ED Triage Notes (Signed)
Pt c/o left flank pain that radiates to back. Hx of kidney stones. Endorses N/V.

## 2023-03-21 ENCOUNTER — Inpatient Hospital Stay (HOSPITAL_COMMUNITY): Payer: 59

## 2023-03-21 ENCOUNTER — Encounter (HOSPITAL_COMMUNITY)
Admission: EM | Disposition: A | Discharge status: Home / Self Care | Payer: Self-pay | Source: Home / Self Care | Attending: Internal Medicine

## 2023-03-21 ENCOUNTER — Encounter (HOSPITAL_COMMUNITY): Payer: Self-pay | Admitting: Internal Medicine

## 2023-03-21 ENCOUNTER — Emergency Department (HOSPITAL_COMMUNITY): Payer: 59

## 2023-03-21 ENCOUNTER — Inpatient Hospital Stay (HOSPITAL_COMMUNITY): Payer: 59 | Admitting: Certified Registered"

## 2023-03-21 ENCOUNTER — Other Ambulatory Visit: Payer: Self-pay

## 2023-03-21 DIAGNOSIS — A419 Sepsis, unspecified organism: Secondary | ICD-10-CM

## 2023-03-21 DIAGNOSIS — B9620 Unspecified Escherichia coli [E. coli] as the cause of diseases classified elsewhere: Secondary | ICD-10-CM | POA: Diagnosis not present

## 2023-03-21 DIAGNOSIS — G4730 Sleep apnea, unspecified: Secondary | ICD-10-CM | POA: Diagnosis not present

## 2023-03-21 DIAGNOSIS — Z87442 Personal history of urinary calculi: Secondary | ICD-10-CM | POA: Diagnosis not present

## 2023-03-21 DIAGNOSIS — R9389 Abnormal findings on diagnostic imaging of other specified body structures: Secondary | ICD-10-CM | POA: Diagnosis not present

## 2023-03-21 DIAGNOSIS — M159 Polyosteoarthritis, unspecified: Secondary | ICD-10-CM | POA: Diagnosis present

## 2023-03-21 DIAGNOSIS — Z91030 Bee allergy status: Secondary | ICD-10-CM | POA: Diagnosis not present

## 2023-03-21 DIAGNOSIS — K760 Fatty (change of) liver, not elsewhere classified: Secondary | ICD-10-CM | POA: Diagnosis not present

## 2023-03-21 DIAGNOSIS — N390 Urinary tract infection, site not specified: Secondary | ICD-10-CM

## 2023-03-21 DIAGNOSIS — E559 Vitamin D deficiency, unspecified: Secondary | ICD-10-CM | POA: Diagnosis present

## 2023-03-21 DIAGNOSIS — Z860102 Personal history of hyperplastic colon polyps: Secondary | ICD-10-CM | POA: Diagnosis not present

## 2023-03-21 DIAGNOSIS — Z9049 Acquired absence of other specified parts of digestive tract: Secondary | ICD-10-CM | POA: Diagnosis not present

## 2023-03-21 DIAGNOSIS — Z8249 Family history of ischemic heart disease and other diseases of the circulatory system: Secondary | ICD-10-CM | POA: Diagnosis not present

## 2023-03-21 DIAGNOSIS — R1032 Left lower quadrant pain: Secondary | ICD-10-CM | POA: Diagnosis not present

## 2023-03-21 DIAGNOSIS — D72829 Elevated white blood cell count, unspecified: Secondary | ICD-10-CM | POA: Diagnosis not present

## 2023-03-21 DIAGNOSIS — N200 Calculus of kidney: Secondary | ICD-10-CM | POA: Diagnosis not present

## 2023-03-21 DIAGNOSIS — F419 Anxiety disorder, unspecified: Secondary | ICD-10-CM | POA: Diagnosis present

## 2023-03-21 DIAGNOSIS — N136 Pyonephrosis: Secondary | ICD-10-CM | POA: Diagnosis not present

## 2023-03-21 DIAGNOSIS — R509 Fever, unspecified: Secondary | ICD-10-CM | POA: Diagnosis not present

## 2023-03-21 DIAGNOSIS — K219 Gastro-esophageal reflux disease without esophagitis: Secondary | ICD-10-CM | POA: Diagnosis present

## 2023-03-21 DIAGNOSIS — E872 Acidosis, unspecified: Secondary | ICD-10-CM | POA: Diagnosis not present

## 2023-03-21 DIAGNOSIS — I10 Essential (primary) hypertension: Secondary | ICD-10-CM

## 2023-03-21 DIAGNOSIS — N201 Calculus of ureter: Secondary | ICD-10-CM | POA: Diagnosis not present

## 2023-03-21 DIAGNOSIS — Z6841 Body Mass Index (BMI) 40.0 and over, adult: Secondary | ICD-10-CM | POA: Diagnosis not present

## 2023-03-21 DIAGNOSIS — E7800 Pure hypercholesterolemia, unspecified: Secondary | ICD-10-CM | POA: Diagnosis not present

## 2023-03-21 DIAGNOSIS — A4151 Sepsis due to Escherichia coli [E. coli]: Secondary | ICD-10-CM | POA: Diagnosis not present

## 2023-03-21 DIAGNOSIS — R6520 Severe sepsis without septic shock: Secondary | ICD-10-CM | POA: Diagnosis not present

## 2023-03-21 DIAGNOSIS — Z79899 Other long term (current) drug therapy: Secondary | ICD-10-CM | POA: Diagnosis not present

## 2023-03-21 DIAGNOSIS — I959 Hypotension, unspecified: Secondary | ICD-10-CM | POA: Diagnosis not present

## 2023-03-21 DIAGNOSIS — N132 Hydronephrosis with renal and ureteral calculous obstruction: Secondary | ICD-10-CM | POA: Diagnosis not present

## 2023-03-21 DIAGNOSIS — Z87891 Personal history of nicotine dependence: Secondary | ICD-10-CM | POA: Diagnosis not present

## 2023-03-21 DIAGNOSIS — Z713 Dietary counseling and surveillance: Secondary | ICD-10-CM | POA: Diagnosis not present

## 2023-03-21 DIAGNOSIS — Z883 Allergy status to other anti-infective agents status: Secondary | ICD-10-CM | POA: Diagnosis not present

## 2023-03-21 HISTORY — PX: CYSTOSCOPY W/ URETERAL STENT PLACEMENT: SHX1429

## 2023-03-21 LAB — URINALYSIS, ROUTINE W REFLEX MICROSCOPIC
Bacteria, UA: NONE SEEN
Bilirubin Urine: NEGATIVE
Glucose, UA: NEGATIVE mg/dL
Ketones, ur: NEGATIVE mg/dL
Nitrite: NEGATIVE
Protein, ur: 100 mg/dL — AB
Specific Gravity, Urine: 1.018 (ref 1.005–1.030)
WBC, UA: >50 WBC/hpf (ref 0–5)
pH: 5 (ref 5.0–8.0)

## 2023-03-21 LAB — LACTIC ACID, PLASMA
Lactic Acid, Venous: 3.4 mmol/L (ref 0.5–1.9)
Lactic Acid, Venous: 1.8 mmol/L (ref 0.5–1.9)

## 2023-03-21 SURGERY — CYSTOSCOPY, WITH RETROGRADE PYELOGRAM AND URETERAL STENT INSERTION
Anesthesia: General | Site: Ureter | Laterality: Left

## 2023-03-21 MED ORDER — ONDANSETRON HCL 4 MG/2ML IJ SOLN
INTRAMUSCULAR | Status: DC | PRN
  Administered 2023-03-21: 4 mg via INTRAVENOUS

## 2023-03-21 MED ORDER — ACETAMINOPHEN 325 MG PO TABS
650.0000 mg | ORAL_TABLET | Freq: Four times a day (QID) | ORAL | Status: DC | PRN
  Administered 2023-03-21 – 2023-03-23 (×3): 650 mg via ORAL
  Filled 2023-03-21 (×3): qty 2

## 2023-03-21 MED ORDER — CHLORHEXIDINE GLUCONATE 0.12 % MT SOLN: 15.0000 mL | Freq: Once | OROMUCOSAL | Status: DC

## 2023-03-21 MED ORDER — ONDANSETRON HCL 4 MG/2ML IJ SOLN
INTRAMUSCULAR | Status: AC
  Filled 2023-03-21: qty 2

## 2023-03-21 MED ORDER — SUCCINYLCHOLINE CHLORIDE 200 MG/10ML IV SOSY
PREFILLED_SYRINGE | INTRAVENOUS | Status: AC
  Filled 2023-03-21: qty 10

## 2023-03-21 MED ORDER — FENTANYL CITRATE PF 50 MCG/ML IJ SOSY: 25.0000 ug | PREFILLED_SYRINGE | INTRAMUSCULAR | Status: DC | PRN

## 2023-03-21 MED ORDER — FENTANYL CITRATE (PF) 250 MCG/5ML IJ SOLN
INTRAMUSCULAR | Status: DC | PRN
  Administered 2023-03-21 (×2): 50 ug via INTRAVENOUS

## 2023-03-21 MED ORDER — PIPERACILLIN-TAZOBACTAM 3.375 G IVPB
3.3750 g | Freq: Three times a day (TID) | INTRAVENOUS | Status: DC
  Administered 2023-03-21 – 2023-03-22 (×4): 3.375 g via INTRAVENOUS
  Filled 2023-03-21 (×8): qty 50

## 2023-03-21 MED ORDER — DEXMEDETOMIDINE HCL IN NACL 80 MCG/20ML IV SOLN
INTRAVENOUS | Status: DC | PRN
Start: 2023-03-21 — End: 2023-03-21
  Administered 2023-03-21: 8 ug via INTRAVENOUS

## 2023-03-21 MED ORDER — MIDAZOLAM HCL 2 MG/2ML IJ SOLN
INTRAMUSCULAR | Status: AC
  Filled 2023-03-21: qty 2

## 2023-03-21 MED ORDER — SUCCINYLCHOLINE CHLORIDE 200 MG/10ML IV SOSY
PREFILLED_SYRINGE | INTRAVENOUS | Status: DC | PRN
  Administered 2023-03-21: 100 mg via INTRAVENOUS

## 2023-03-21 MED ORDER — LACTATED RINGERS IV BOLUS
1000.0000 mL | Freq: Once | INTRAVENOUS | Status: AC
  Administered 2023-03-21: 1000 mL via INTRAVENOUS

## 2023-03-21 MED ORDER — FENTANYL CITRATE PF 50 MCG/ML IJ SOSY
50.0000 ug | PREFILLED_SYRINGE | Freq: Once | INTRAMUSCULAR | Status: AC
  Administered 2023-03-21: 50 ug via INTRAVENOUS
  Filled 2023-03-21: qty 1

## 2023-03-21 MED ORDER — DIATRIZOATE MEGLUMINE 30 % UR SOLN
URETHRAL | Status: DC | PRN
  Administered 2023-03-21: 10 mL via URETHRAL

## 2023-03-21 MED ORDER — LACTATED RINGERS IV SOLN: INTRAVENOUS | Status: DC

## 2023-03-21 MED ORDER — DEXAMETHASONE SODIUM PHOSPHATE 4 MG/ML IJ SOLN
INTRAMUSCULAR | Status: DC | PRN
  Administered 2023-03-21: 5 mg via INTRAVENOUS

## 2023-03-21 MED ORDER — ONDANSETRON HCL 4 MG/2ML IJ SOLN: 4.0000 mg | Freq: Four times a day (QID) | INTRAMUSCULAR | Status: DC | PRN

## 2023-03-21 MED ORDER — PIPERACILLIN-TAZOBACTAM 3.375 G IVPB 30 MIN
3.3750 g | Freq: Once | INTRAVENOUS | Status: AC
  Administered 2023-03-21: 3.375 g via INTRAVENOUS
  Filled 2023-03-21: qty 50

## 2023-03-21 MED ORDER — PROPOFOL 10 MG/ML IV BOLUS
INTRAVENOUS | Status: DC | PRN
  Administered 2023-03-21: 200 mg via INTRAVENOUS

## 2023-03-21 MED ORDER — KETOROLAC TROMETHAMINE 15 MG/ML IJ SOLN
15.0000 mg | Freq: Once | INTRAMUSCULAR | Status: AC
  Administered 2023-03-21: 15 mg via INTRAVENOUS
  Filled 2023-03-21: qty 1

## 2023-03-21 MED ORDER — PHENYLEPHRINE 80 MCG/ML (10ML) SYRINGE FOR IV PUSH (FOR BLOOD PRESSURE SUPPORT)
PREFILLED_SYRINGE | INTRAVENOUS | Status: DC | PRN
  Administered 2023-03-21 (×3): 160 ug via INTRAVENOUS

## 2023-03-21 MED ORDER — FENTANYL CITRATE PF 50 MCG/ML IJ SOSY
25.0000 ug | PREFILLED_SYRINGE | INTRAMUSCULAR | Status: DC | PRN
  Administered 2023-03-21 – 2023-03-24 (×6): 25 ug via INTRAVENOUS
  Filled 2023-03-21 (×6): qty 1

## 2023-03-21 MED ORDER — SODIUM CHLORIDE 0.9 % IR SOLN
Status: DC | PRN
  Administered 2023-03-21: 3000 mL

## 2023-03-21 MED ORDER — MIDAZOLAM HCL 2 MG/2ML IJ SOLN
INTRAMUSCULAR | Status: DC | PRN
  Administered 2023-03-21: 2 mg via INTRAVENOUS

## 2023-03-21 MED ORDER — STERILE WATER FOR IRRIGATION IR SOLN
Status: DC | PRN
  Administered 2023-03-21: 1000 mL

## 2023-03-21 MED ORDER — ONDANSETRON HCL 4 MG/2ML IJ SOLN
4.0000 mg | Freq: Once | INTRAMUSCULAR | Status: AC
  Administered 2023-03-21: 4 mg via INTRAVENOUS
  Filled 2023-03-21: qty 2

## 2023-03-21 MED ORDER — ACETAMINOPHEN 650 MG RE SUPP: 650.0000 mg | Freq: Four times a day (QID) | RECTAL | Status: DC | PRN

## 2023-03-21 MED ORDER — IOHEXOL 300 MG/ML  SOLN
100.0000 mL | Freq: Once | INTRAMUSCULAR | Status: AC | PRN
  Administered 2023-03-21: 100 mL via INTRAVENOUS

## 2023-03-21 MED ORDER — ORAL CARE MOUTH RINSE: 15.0000 mL | Freq: Once | OROMUCOSAL | Status: DC

## 2023-03-21 MED ORDER — LACTATED RINGERS IV BOLUS
500.0000 mL | Freq: Once | INTRAVENOUS | Status: AC
  Administered 2023-03-21: 500 mL via INTRAVENOUS

## 2023-03-21 MED ORDER — DEXMEDETOMIDINE HCL IN NACL 80 MCG/20ML IV SOLN
INTRAVENOUS | Status: AC
  Filled 2023-03-21: qty 20

## 2023-03-21 MED ORDER — OXYCODONE HCL 5 MG/5ML PO SOLN: 5.0000 mg | Freq: Once | ORAL | Status: DC | PRN

## 2023-03-21 MED ORDER — FENTANYL CITRATE PF 50 MCG/ML IJ SOSY
25.0000 ug | PREFILLED_SYRINGE | Freq: Once | INTRAMUSCULAR | Status: AC
  Administered 2023-03-21: 25 ug via INTRAVENOUS
  Filled 2023-03-21: qty 1

## 2023-03-21 MED ORDER — PHENYLEPHRINE 80 MCG/ML (10ML) SYRINGE FOR IV PUSH (FOR BLOOD PRESSURE SUPPORT)
PREFILLED_SYRINGE | INTRAVENOUS | Status: AC
  Filled 2023-03-21: qty 10

## 2023-03-21 MED ORDER — FENTANYL CITRATE (PF) 250 MCG/5ML IJ SOLN
INTRAMUSCULAR | Status: AC
  Filled 2023-03-21: qty 5

## 2023-03-21 MED ORDER — ROCURONIUM BROMIDE 10 MG/ML (PF) SYRINGE
PREFILLED_SYRINGE | INTRAVENOUS | Status: AC
  Filled 2023-03-21: qty 10

## 2023-03-21 MED ORDER — ACETAMINOPHEN 500 MG PO TABS
1000.0000 mg | ORAL_TABLET | Freq: Once | ORAL | Status: AC
  Administered 2023-03-21: 1000 mg via ORAL
  Filled 2023-03-21: qty 2

## 2023-03-21 MED ORDER — ONDANSETRON HCL 4 MG PO TABS: 4.0000 mg | ORAL_TABLET | Freq: Four times a day (QID) | ORAL | Status: DC | PRN

## 2023-03-21 MED ORDER — EPHEDRINE SULFATE-NACL 50-0.9 MG/10ML-% IV SOSY
PREFILLED_SYRINGE | INTRAVENOUS | Status: DC | PRN
Start: 2023-03-21 — End: 2023-03-21
  Administered 2023-03-21: 5 mg via INTRAVENOUS

## 2023-03-21 MED ORDER — SODIUM CHLORIDE 0.9 % IV SOLN: INTRAVENOUS | Status: DC

## 2023-03-21 MED ORDER — DIATRIZOATE MEGLUMINE 30 % UR SOLN
URETHRAL | Status: AC
  Filled 2023-03-21: qty 100

## 2023-03-21 MED ORDER — CHLORHEXIDINE GLUCONATE 0.12 % MT SOLN
OROMUCOSAL | Status: AC
  Administered 2023-03-21: 15 mL
  Filled 2023-03-21: qty 15

## 2023-03-21 MED ORDER — DEXAMETHASONE SODIUM PHOSPHATE 10 MG/ML IJ SOLN
INTRAMUSCULAR | Status: AC
  Filled 2023-03-21: qty 1

## 2023-03-21 MED ORDER — EPHEDRINE 5 MG/ML INJ
INTRAVENOUS | Status: AC
  Filled 2023-03-21: qty 5

## 2023-03-21 MED ORDER — OXYCODONE HCL 5 MG PO TABS: 5.0000 mg | ORAL_TABLET | Freq: Once | ORAL | Status: DC | PRN

## 2023-03-21 SURGICAL SUPPLY — 23 items
BAG DRAIN URO TABLE W/ADPT NS (BAG) ×2 IMPLANT
BAG DRN 8 ADPR NS SKTRN CSTL (BAG) ×1
BAG HAMPER (MISCELLANEOUS) ×2 IMPLANT
CATH INTERMIT  6FR 70CM (CATHETERS) ×2 IMPLANT
CLOTH BEACON ORANGE TIMEOUT ST (SAFETY) ×2 IMPLANT
DECANTER SPIKE VIAL GLASS SM (MISCELLANEOUS) ×2 IMPLANT
GLOVE BIO SURGEON STRL SZ8 (GLOVE) ×2 IMPLANT
GLOVE BIOGEL PI IND STRL 7.0 (GLOVE) ×4 IMPLANT
GOWN STRL REUS W/TWL LRG LVL3 (GOWN DISPOSABLE) ×2 IMPLANT
GOWN STRL REUS W/TWL XL LVL3 (GOWN DISPOSABLE) ×2 IMPLANT
GUIDEWIRE STR ZIPWIRE 035X150 (MISCELLANEOUS) ×2 IMPLANT
IV NS IRRIG 3000ML ARTHROMATIC (IV SOLUTION) ×2 IMPLANT
KIT TURNOVER CYSTO (KITS) ×2 IMPLANT
MANIFOLD NEPTUNE II (INSTRUMENTS) ×2 IMPLANT
PACK CYSTO (CUSTOM PROCEDURE TRAY) ×2 IMPLANT
PAD ARMBOARD 7.5X6 YLW CONV (MISCELLANEOUS) ×2 IMPLANT
POSITIONER HEAD 8X9X4 ADT (SOFTGOODS) ×2 IMPLANT
STENT URET 6FRX26 CONTOUR (STENTS) IMPLANT
SYR 10ML LL (SYRINGE) ×2 IMPLANT
TOWEL OR 17X26 4PK STRL BLUE (TOWEL DISPOSABLE) ×2 IMPLANT
TRAY FOLEY W/BAG SLVR 16FR (SET/KITS/TRAYS/PACK) ×1
TRAY FOLEY W/BAG SLVR 16FR ST (SET/KITS/TRAYS/PACK) IMPLANT
WATER STERILE IRR 500ML POUR (IV SOLUTION) ×2 IMPLANT

## 2023-03-21 NOTE — ED Provider Notes (Signed)
[facility] Provider Note   CSN: [id number] Arrival date & time: 03/20/23  2025     History  Chief Complaint  Patient presents with   Flank Pain    Loree AYLANIE CUBILLOS is a 62 y.o. female.  62 year old female with history of kidney stones that presents the ER today secondary to left abdominal pain.  Patient states that she started having pain in the left abdomen on Saturday.  Improved on Sunday.  Then Monday Tuesday she is felt unwell but only little bit of pain nothing severe.  Had some chills at home but no measured fevers.  However when she went to work yesterday she had significantly worse.  Felt weak, nauseous, pain.  Had some nausea vomiting.  No history of this with her kidney stones in the past.  Here for further evaluation.   Flank Pain       Home Medications Prior to Admission medications   Medication Sig Start Date End Date Taking? Authorizing Provider  acetaminophen (TYLENOL) 500 MG tablet Take 1,000 mg by mouth every 6 (six) hours as needed for headache.     [provider]  albuterol (VENTOLIN HFA) 108 (90 Base) MCG/ACT inhaler INHALE 1-2 puffs INTO THE LUNGS EVERY 6 HOURS AS NEEDED FOR WHEEZING OR SHORTNESS OF BREATH 12/18/22   Coralyn Helling, MD  busPIRone (BUSPAR) 5 MG tablet Take 1 tablet (5 mg total) by mouth 2 (two) times daily. 10/17/22     loratadine (CLARITIN) 10 MG tablet Take 10 mg by mouth every morning.    [provider]  torsemide (DEMADEX) 10 MG tablet Take 1 tablet (10 mg total) by mouth daily as needed. 11/29/22     Vitamin D, Ergocalciferol, (DRISDOL) 1.25 MG (50000 UNIT) CAPS capsule Take 1 capsule (50,000 Units total) by mouth 2 (two) times a week. 12/06/22         Allergies    Bee venom and Macrobid [nitrofurantoin]    Review of Systems   Review of Systems  Genitourinary:  Positive for flank pain.    Physical Exam Updated Vital Signs BP 109/62   Pulse 91   Temp 98.7 F (37.1 C)  (Oral)   Resp (!) 24   Wt 107 kg   SpO2 90%   BMI 40.51 kg/m  Physical Exam Vitals and nursing note reviewed.  Constitutional:      Appearance: She is well-developed.  HENT:     Head: Normocephalic and atraumatic.  Eyes:     Pupils: Pupils are equal, round, and reactive to light.  Cardiovascular:     Rate and Rhythm: Regular rhythm. Tachycardia present.  Pulmonary:     Effort: No respiratory distress.     Breath sounds: No stridor.  Abdominal:     General: There is no distension.     Tenderness: There is no abdominal tenderness.  Musculoskeletal:     Cervical back: Normal range of motion.  Skin:    General: Skin is warm and dry.  Neurological:     General: No focal deficit present.     Mental Status: She is alert.     ED Results / Procedures / Treatments   Labs (all labs ordered are listed, but only abnormal results are displayed) Labs Reviewed  COMPREHENSIVE METABOLIC PANEL - Abnormal; Notable for the following components:      Result Value   Glucose, Bld 128 (*)    All other components within normal limits  CBC - Abnormal;  Notable for the following components:   WBC 12.6 (*)    RBC 5.15 (*)    Hemoglobin 15.8 (*)    HCT 46.8 (*)    All other components within normal limits  URINALYSIS, ROUTINE W REFLEX MICROSCOPIC - Abnormal; Notable for the following components:   Hgb urine dipstick MODERATE (*)    Protein, ur 100 (*)    Leukocytes,Ua MODERATE (*)    All other components within normal limits  LACTIC ACID, PLASMA - Abnormal; Notable for the following components:   Lactic Acid, Venous 3.4 (*)    All other components within normal limits  CULTURE, BLOOD (ROUTINE X 2)  CULTURE, BLOOD (ROUTINE X 2)  LIPASE, BLOOD  LACTIC ACID, PLASMA    EKG None  Radiology CT ABDOMEN PELVIS W CONTRAST  Result Date: 03/21/2023 CLINICAL DATA:  Left lower abdominal pain radiating to groin EXAM: CT ABDOMEN AND PELVIS WITH CONTRAST TECHNIQUE: Multidetector CT imaging of the  abdomen and pelvis was performed using the standard protocol following bolus administration of intravenous contrast. RADIATION DOSE REDUCTION: This exam was performed according to the departmental dose-optimization program which includes automated exposure control, adjustment of the mA and/or kV according to patient size and/or use of iterative reconstruction technique. CONTRAST:  OMNIPAQUE IOHEXOL 300 MG/ML  SOLN COMPARISON:  06/22/2015 FINDINGS: Lower chest: Elevation of the right hemidiaphragm. Right base atelectasis. No effusions. Hepatobiliary: Prior cholecystectomy. Diffuse fatty infiltration of the liver. No focal hepatic abnormality. Pancreas: No focal abnormality or ductal dilatation. Spleen: No focal abnormality.  Normal size. Adrenals/Urinary Tract: Moderate left hydronephrosis and perinephric stranding due to 9 mm proximal left ureteral stone. No stones or hydronephrosis on the right. Adrenal glands and urinary bladder unremarkable. Stomach/Bowel: Normal appendix. Prior gastric sleeve. Stomach, large and small bowel grossly unremarkable. Vascular/Lymphatic: No evidence of aneurysm or adenopathy. Reproductive: Uterus and adnexa unremarkable.  No mass. Other: No free fluid or free air. Musculoskeletal: No acute bony abnormality. IMPRESSION: 9 mm proximal left ureteral stone with moderate left hydronephrosis and perinephric stranding. Several other small nonobstructing left renal stones noted. Hepatic steatosis. Elevated right hemidiaphragm with right base atelectasis. Electronically Signed   By: Charlett Nose M.D.   On: 03/21/2023 02:08    Procedures .Critical Care  Performed by: Marily Memos, MD Authorized by: Marily Memos, MD   Critical care provider statement:    Critical care time (minutes):  30   Critical care was necessary to treat or prevent imminent or life-threatening deterioration of the following conditions:  Sepsis   Critical care was time spent personally by me on the  following activities:  Development of treatment plan with patient or surrogate, discussions with consultants, evaluation of patient's response to treatment, examination of patient, ordering and review of laboratory studies, ordering and review of radiographic studies, ordering and performing treatments and interventions, pulse oximetry, re-evaluation of patient's condition and review of old charts     Medications Ordered in ED Medications  ondansetron (ZOFRAN) injection 4 mg (4 mg Intravenous Given 03/21/23 0103)  fentaNYL (SUBLIMAZE) injection 50 mcg (50 mcg Intravenous Given 03/21/23 0105)  iohexol (OMNIPAQUE) 300 MG/ML solution 100 mL (100 mLs Intravenous Contrast Given 03/21/23 0113)  piperacillin-tazobactam (ZOSYN) IVPB 3.375 g (0 g Intravenous Stopped 03/21/23 0208)  acetaminophen (TYLENOL) tablet 1,000 mg (1,000 mg Oral Given 03/21/23 0134)  lactated ringers bolus 1,000 mL (1,000 mLs Intravenous New Bag/Given 03/21/23 0136)  fentaNYL (SUBLIMAZE) injection 25 mcg (25 mcg Intravenous Given 03/21/23 0327)    ED Course/ Medical  Decision Making/ A&P                                 Medical Decision Making Amount and/or Complexity of Data Reviewed Labs: ordered. Radiology: ordered. ECG/medicine tests: ordered.  Risk OTC drugs. Prescription drug management. Decision regarding hospitalization.  Initial vitals were reassuring however ultimately before seeing the patient she became febrile with tachycardia.  Sepsis labs ordered.  Lactic acid being elevated to 3.4.  Blood cultures already been drawn antibiotics and started for possible abdominal infection considering possible diverticulitis or colitis however her CT scan is interpreted myself shows a large proximal stone with some perinephric stranding.  I am worried that she might have an infected stone as her urine does show leukocytes leuk esterase but is negative for nitrites or bacteria.  Zosyn should cover even if she does have  infection but without large stone, white count, fever and everything else going on I will discuss with urology.  Pain seems to be overall well-controlled by suspect will need to be admitted anyway the question is can she stay here or does she need an urgent procedure.  Discussed with Dr. Mena Goes with urology.  States that she needs to be stented.  However secondary to anesthesia shortage will need to go to [facility] where they do not have great anesthesia coverage either.  States that they will be able to come get her stented this morning at [facility] and to admit to the hospitalist service.  She decompensates in any way to read triage and probably send her down to [facility]  This will only be a delay of about 1 to 2 hours for the procedure and she is stable at this time with appropriate treatments already instituted so for like this is appropriate.  Will discuss with hospitalist. D/w Dr. Thomes Dinning for admit.   Final Clinical Impression(s) / ED Diagnoses Final diagnoses:  Kidney stone    Rx / DC Orders ED Discharge Orders     None         Azarian Starace, Barbara Cower, MD 03/21/23 [phone]

## 2023-03-21 NOTE — Plan of Care (Signed)
  Problem: Education: Goal: Knowledge of General Education information will improve Description: Including pain rating scale, medication(s)/side effects and non-pharmacologic comfort measures Outcome: Progressing   Problem: Health Behavior/Discharge Planning: Goal: Ability to manage health-related needs will improve Outcome: Progressing   Problem: Clinical Measurements: Goal: Ability to maintain clinical measurements within normal limits will improve Outcome: Progressing Goal: Will remain free from infection Outcome: Progressing Goal: Diagnostic test results will improve Outcome: Progressing Goal: Respiratory complications will improve Outcome: Progressing Goal: Cardiovascular complication will be avoided Outcome: Progressing   Problem: Activity: Goal: Risk for activity intolerance will decrease Outcome: Progressing   Problem: Nutrition: Goal: Adequate nutrition will be maintained Outcome: Progressing   Problem: Coping: Goal: Level of anxiety will decrease Outcome: Progressing   Problem: Elimination: Goal: Will not experience complications related to bowel motility Outcome: Progressing Goal: Will not experience complications related to urinary retention Outcome: Progressing   Problem: Pain Managment: Goal: General experience of comfort will improve Outcome: Progressing   Problem: Safety: Goal: Ability to remain free from injury will improve Outcome: Progressing   Problem: Skin Integrity: Goal: Risk for impaired skin integrity will decrease Outcome: Progressing   Problem: Education: Goal: Understanding of post-operative needs will improve Outcome: Progressing Goal: Individualized Educational Video(s) Outcome: Progressing   Problem: Clinical Measurements: Goal: Postoperative complications will be avoided or minimized Outcome: Progressing   Problem: Respiratory: Goal: Will regain and/or maintain adequate ventilation Outcome: Progressing   Problem:  Education: Goal: Knowledge of General Education information will improve Description: Including pain rating scale, medication(s)/side effects and non-pharmacologic comfort measures Outcome: Progressing   Problem: Health Behavior/Discharge Planning: Goal: Ability to manage health-related needs will improve Outcome: Progressing   Problem: Clinical Measurements: Goal: Ability to maintain clinical measurements within normal limits will improve Outcome: Progressing Goal: Will remain free from infection Outcome: Progressing Goal: Diagnostic test results will improve Outcome: Progressing Goal: Respiratory complications will improve Outcome: Progressing Goal: Cardiovascular complication will be avoided Outcome: Progressing   Problem: Activity: Goal: Risk for activity intolerance will decrease Outcome: Progressing   Problem: Nutrition: Goal: Adequate nutrition will be maintained Outcome: Progressing   Problem: Coping: Goal: Level of anxiety will decrease Outcome: Progressing   Problem: Elimination: Goal: Will not experience complications related to bowel motility Outcome: Progressing Goal: Will not experience complications related to urinary retention Outcome: Progressing   Problem: Pain Managment: Goal: General experience of comfort will improve Outcome: Progressing   Problem: Safety: Goal: Ability to remain free from injury will improve Outcome: Progressing   Problem: Skin Integrity: Goal: Risk for impaired skin integrity will decrease Outcome: Progressing

## 2023-03-21 NOTE — H&P (View-Only) (Signed)
Urology Consult  Referring physician: Dr. Thomes Dinning Reason for referral: left ureteral stone, fever  Chief Complaint: left flank pain  History of Present Illness:  Marilyn Martin is a 61yo with a history of nephrolithiasis who presented to the ER this morning with a 5 day history of worsening abdominal pain. The pain started on Saturday and is locate din her lower left abdomen. She developed nausea and vomiting yesterday. She had low grade fevers at home. She denies any significant LUTS. No hematuria or dysuria. CT obtained int he ER shows a 9mm left proximal ureteral calculus and a 3-76mm left renal calculus with mild to moderate left hydronephrosis. WBC count 12.8. Lactic acid was initially 3.4 but has decreased to 1.8 with antibiotic and hydration. Temp 101.4 in the ER  Past Medical History:  Diagnosis Date   Anxiety    Arthritis    osteo in knees and ankles    Constipation    Edema, lower extremity    Fatty liver    GERD (gastroesophageal reflux disease)    Headache    occ    High cholesterol    History of colon polyps    2013  hyperplastic   History of esophagitis    2013-- gerd   History of kidney stones    Hypertension    IBS (irritable bowel syndrome)    Joint pain    Left ureteral stone    Nephrolithiasis    left non-obstuctive per ct 06-22-2015   Osteoarthritis    Sleep apnea    cpap  auto starts at 2 may go up to 13    Vertigo    Vitamin D deficiency    Past Surgical History:  Procedure Laterality Date   CARDIOVASCULAR STRESS TEST  12-26-2012   normal perfusion study/  no ischemia or scar   CESAREAN SECTION  1987   w/  Bilateral Tubal Ligation   CHOLECYSTECTOMY N/A 07/31/2013   Procedure: LAPAROSCOPIC CHOLECYSTECTOMY;  Surgeon: Dalia Heading, MD;  Location: [facility];  Service: General;  Laterality: N/A;   COLONOSCOPY WITH ESOPHAGOGASTRODUODENOSCOPY (EGD)  04/25/2012   Procedure: COLONOSCOPY WITH ESOPHAGOGASTRODUODENOSCOPY (EGD);  Surgeon: Malissa Hippo, MD;   Location: [facility];  Service: Endoscopy;  Laterality: N/A;  730   CYSTOSCOPY W/ URETERAL STENT PLACEMENT Left 08/08/2015   Procedure: LEFT STENT REPLACEMENT;  Surgeon: Malen Gauze, MD;  Location: [facility];  Service: Urology;  Laterality: Left;   CYSTOSCOPY WITH RETROGRADE PYELOGRAM, URETEROSCOPY AND STENT PLACEMENT Left 07/25/2015   Procedure: CYSTOSCOPY WITH LEFT RETROGRADE,;  Surgeon: Malen Gauze, MD;  Location: [facility];  Service: Urology;  Laterality: Left;   CYSTOSCOPY/RETROGRADE/URETEROSCOPY/STONE EXTRACTION WITH BASKET Left 08/08/2015   Procedure: CYSTOSCOPY LEFT RETROGRADE/URETEROSCOPY/STONE EXTRACTION WITH BASKET;  Surgeon: Malen Gauze, MD;  Location: [facility];  Service: Urology;  Laterality: Left;   DILATION AND CURETTAGE OF UTERUS  1986   HOLMIUM LASER APPLICATION Left 07/25/2015   Procedure: LEFT HOLMIUM LASER STONE EXTRACTION  AND LEFT STENT PLACEMENT;  Surgeon: Malen Gauze, MD;  Location: [facility];  Service: Urology;  Laterality: Left;   HOLMIUM LASER APPLICATION Left 08/08/2015   Procedure: HOLMIUM LASER APPLICATION;  Surgeon: Malen Gauze, MD;  Location: [facility];  Service: Urology;  Laterality: Left;   LAPAROSCOPIC GASTRIC SLEEVE RESECTION WITH HIATAL HERNIA REPAIR N/A 05/14/2016   Procedure: LAPAROSCOPIC GASTRIC SLEEVE RESECTION WITH HIATAL HERNIA REPAIR;  Surgeon: Luretha Murphy, MD;  Location: [facility];  Service: General;  Laterality: N/A;   LIVER BIOPSY N/A 07/31/2013   Procedure: LIVER BIOPSY;  Surgeon: Dalia Heading, MD;  Location: [facility];  Service: General;  Laterality: N/A;   MALONEY DILATION  04/25/2012   Procedure: Elease Hashimoto DILATION;  Surgeon: Malissa Hippo, MD;  Location: [facility];  Service: Endoscopy;  Laterality: N/A;   TONSILLECTOMY  1967   TRANSTHORACIC ECHOCARDIOGRAM  12-26-2012   mild LVH,  ef 55-60%,  grade 1 diastolic dysfunction   TUBAL LIGATION      Medications: I have  reviewed the patient's current medications. Allergies:  Allergies  Allergen Reactions   Bee Venom Swelling   Macrobid [Nitrofurantoin] Rash    Family History  Problem Relation Age of Onset   Alzheimer's disease Mother    Thyroid disease Mother    Cancer Mother    Sudden death Mother    Stroke Mother    Stroke Father    Heart attack Father    Diabetes Father    High blood pressure Father    High Cholesterol Father    Heart disease Father    Alzheimer's disease Sister    Diabetes Brother    Colon cancer Paternal Uncle    Social History:  reports that she quit smoking about 41 years ago. Her smoking use included cigarettes. She started smoking about 45 years ago. She has a 2 pack-year smoking history. She has never used smokeless tobacco. She reports current alcohol use. She reports that she does not use drugs.  Review of Systems  Constitutional:  Positive for chills and fever.  Gastrointestinal:  Positive for abdominal pain.  All other systems reviewed and are negative.   Physical Exam:  Vital signs in last 24 hours: Temp:  [97.8 F (36.6 C)-101.4 F (38.6 C)] 98.5 F (36.9 C) (10/17 0929) Pulse Rate:  [78-119] 85 (10/17 0929) Resp:  [18-37] 28 (10/17 0929) BP: (94-144)/(46-79) 112/79 (10/17 0929) SpO2:  [87 %-99 %] 95 % (10/17 0929) Weight:  [284 kg] 107 kg (10/17 0929) Physical Exam Vitals reviewed.  Constitutional:      Appearance: Normal appearance.  HENT:     Head: Normocephalic and atraumatic.     Nose: Nose normal. No congestion or rhinorrhea.  Eyes:     Extraocular Movements: Extraocular movements intact.     Pupils: Pupils are equal, round, and reactive to light.  Cardiovascular:     Rate and Rhythm: Normal rate and regular rhythm.  Pulmonary:     Effort: Pulmonary effort is normal. No respiratory distress.  Abdominal:     General: Abdomen is flat. There is no distension.  Musculoskeletal:        General: No swelling. Normal range of motion.      Cervical back: Normal range of motion and neck supple.  Skin:    General: Skin is warm and dry.  Neurological:     General: No focal deficit present.     Mental Status: She is alert and oriented to person, place, and time.  Psychiatric:        Mood and Affect: Mood normal.        Behavior: Behavior normal.        Thought Content: Thought content normal.        Judgment: Judgment normal.     Laboratory Data:  Results for orders placed or performed during the hospital encounter of 03/20/23 (from the past 72 hour(s))  Urinalysis, Routine w reflex microscopic -Urine, Clean Catch     Status: Abnormal  Collection Time: 03/20/23 12:13 AM  Result Value Ref Range   Color, Urine YELLOW YELLOW   APPearance CLEAR CLEAR   Specific Gravity, Urine 1.018 1.005 - 1.030   pH 5.0 5.0 - 8.0   Glucose, UA NEGATIVE NEGATIVE mg/dL   Hgb urine dipstick MODERATE (A) NEGATIVE   Bilirubin Urine NEGATIVE NEGATIVE   Ketones, ur NEGATIVE NEGATIVE mg/dL   Protein, ur 546 (A) NEGATIVE mg/dL   Nitrite NEGATIVE NEGATIVE   Leukocytes,Ua MODERATE (A) NEGATIVE   RBC / HPF 21-50 0 - 5 RBC/hpf   WBC, UA >50 0 - 5 WBC/hpf   Bacteria, UA NONE SEEN NONE SEEN   Squamous Epithelial / HPF 0-5 0 - 5 /HPF    Comment: Performed at [facility], [address], [place], [place] [address]  Lipase, blood     Status: None   Collection Time: 03/20/23  8:49 PM  Result Value Ref Range   Lipase 34 11 - 51 U/L    Comment: Performed at [facility], [address], [place], [place] [address]  Comprehensive metabolic panel     Status: Abnormal   Collection Time: 03/20/23  8:49 PM  Result Value Ref Range   Sodium 137 135 - 145 mmol/L   Potassium 3.9 3.5 - 5.1 mmol/L   Chloride 101 98 - 111 mmol/L   CO2 24 22 - 32 mmol/L   Glucose, Bld 128 (H) 70 - 99 mg/dL    Comment: Glucose reference range applies only to samples taken after fasting for at least 8 hours.   BUN 16 8 - 23 mg/dL   Creatinine, Ser 1.82 0.44 - 1.00 mg/dL    Calcium 9.1 8.9 - 99.3 mg/dL   Total Protein 7.8 6.5 - 8.1 g/dL   Albumin 4.2 3.5 - 5.0 g/dL   AST 17 15 - 41 U/L   ALT 20 0 - 44 U/L   Alkaline Phosphatase 66 38 - 126 U/L   Total Bilirubin 0.8 0.3 - 1.2 mg/dL   GFR, Estimated >71 >69 mL/min    Comment: (NOTE) Calculated using the CKD-EPI Creatinine Equation (2021)    Anion gap 12 5 - 15    Comment: Performed at [facility], [address] [place], [place] [address]  CBC     Status: Abnormal   Collection Time: 03/20/23  8:49 PM  Result Value Ref Range   WBC 12.6 (H) 4.0 - 10.5 K/uL   RBC 5.15 (H) 3.87 - 5.11 MIL/uL   Hemoglobin 15.8 (H) 12.0 - 15.0 g/dL   HCT 81.0 (H) 17.5 - 10.2 %   MCV 90.9 80.0 - 100.0 fL   MCH 30.7 26.0 - 34.0 pg   MCHC 33.8 30.0 - 36.0 g/dL   RDW 58.5 27.7 - 82.4 %   Platelets 229 150 - 400 K/uL   nRBC 0.0 0.0 - 0.2 %    Comment: Performed at [facility], [address] [place], [place] [address]  Lactic acid, plasma     Status: Abnormal   Collection Time: 03/21/23  1:00 AM  Result Value Ref Range   Lactic Acid, Venous 3.4 (HH) 0.5 - 1.9 mmol/L    Comment: CRITICAL RESULT CALLED TO, READ BACK BY AND VERIFIED WITH K. NICHOLS AT 0139 ON 10.17.24 BY ADGER J Performed at [facility], [address] [place], [place] [address]   Blood culture (routine x 2)     Status: None (Preliminary result)   Collection Time: 03/21/23  1:00 AM   Specimen:  Left Antecubital; Blood  Result Value Ref Range   Specimen Description LEFT ANTECUBITAL    Special Requests BOTTLES DRAWN AEROBIC AND ANAEROBIC    Culture      NO GROWTH < 12 HOURS Performed at [facility], [address], [place], [place] [address]    Report Status PENDING   Blood culture (routine x 2)     Status: None (Preliminary result)   Collection Time: 03/21/23  1:34 AM   Specimen: BLOOD  Result Value Ref Range   Specimen Description BLOOD RIGHT ANTECUBITAL    Special Requests      BOTTLES DRAWN AEROBIC AND ANAEROBIC Blood Culture results may not be  optimal due to an excessive volume of blood received in culture bottles   Culture      NO GROWTH < 12 HOURS Performed at [facility], [address] [place], [place] [address]    Report Status PENDING   Lactic acid, plasma     Status: None   Collection Time: 03/21/23  3:04 AM  Result Value Ref Range   Lactic Acid, Venous 1.8 0.5 - 1.9 mmol/L    Comment: Performed at [facility], [address], [place], [place] [address]   Recent Results (from the past 240 hour(s))  Blood culture (routine x 2)     Status: None (Preliminary result)   Collection Time: 03/21/23  1:00 AM   Specimen: Left Antecubital; Blood  Result Value Ref Range Status   Specimen Description LEFT ANTECUBITAL  Final   Special Requests BOTTLES DRAWN AEROBIC AND ANAEROBIC  Final   Culture   Final    NO GROWTH < 12 HOURS Performed at [facility], [address], [place], [place] [address]    Report Status PENDING  Incomplete  Blood culture (routine x 2)     Status: None (Preliminary result)   Collection Time: 03/21/23  1:34 AM   Specimen: BLOOD  Result Value Ref Range Status   Specimen Description BLOOD RIGHT ANTECUBITAL  Final   Special Requests   Final    BOTTLES DRAWN AEROBIC AND ANAEROBIC Blood Culture results may not be optimal due to an excessive volume of blood received in culture bottles   Culture   Final    NO GROWTH < 12 HOURS Performed at [facility], [address] [place], [place] [address]    Report Status PENDING  Incomplete   Creatinine: Recent Labs    03/20/23 2049  CREATININE 1.00   Baseline Creatinine: 1  Impression/Assessment:  61yo with left ureteral stone, fever  Plan:  -We discussed the management of kidney stones. These options include observation, ureteroscopy, shockwave lithotripsy ([facility]) and percutaneous nephrolithotomy (PCNL). We discussed which options are relevant to the patient's stone(s). We discussed the natural history of kidney stones as well as the complications of  untreated stones and the impact on quality of life without treatment as well as with each of the above listed treatments. We also discussed the efficacy of each treatment in its ability to clear the stone burden. With any of these management options I discussed the signs and symptoms of infection and the need for emergent treatment should these be experienced. For each option we discussed the ability of each procedure to clear the patient of their stone burden.   For observation I described the risks which include but are not limited to silent renal damage, life-threatening infection, need for emergent surgery, failure to pass stone and pain.   For ureteroscopy I described the risks  which include bleeding, infection, damage to contiguous structures, positioning injury, ureteral stricture, ureteral avulsion, ureteral injury, need for prolonged ureteral stent, inability to perform ureteroscopy, need for an interval procedure, inability to clear stone burden, stent discomfort/pain, heart attack, stroke, pulmonary embolus and the inherent risks with general anesthesia.   For shockwave lithotripsy I described the risks which include arrhythmia, kidney contusion, kidney hemorrhage, need for transfusion, pain, inability to adequately break up stone, inability to pass stone fragments, Steinstrasse, infection associated with obstructing stones, need for alternate surgical procedure, need for repeat shockwave lithotripsy, MI, CVA, PE and the inherent risks with anesthesia/conscious sedation.   For PCNL I described the risks including positioning injury, pneumothorax, hydrothorax, need for chest tube, inability to clear stone burden, renal laceration, arterial venous fistula or malformation, need for embolization of kidney, loss of kidney or renal function, need for repeat procedure, need for prolonged nephrostomy tube, ureteral avulsion, MI, CVA, PE and the inherent risks of general anesthesia.   - The patient would  like to proceed with left ureteral stent placement  Wilkie Aye 03/21/2023, 9:43 AM

## 2023-03-21 NOTE — H&P (Addendum)
History and Physical    Patient: Marilyn Martin [id number] DOB: Jul 16, 1960 DOA: 03/20/2023 DOS: the patient was seen and examined on 03/21/2023 PCP: Ignatius Specking, MD  Patient coming from: Home  Chief Complaint:  Chief Complaint  Patient presents with   Flank Pain   HPI: Marilyn Martin is a 62 y.o. female with medical history significant of nephrolithiasis and anxiety who presents to the emergency department due to left flank pain which started on Saturday (10/12), pain improved on Sunday and she still continued to have some pain and chills without fever at home on Monday and Tuesday.  Symptoms worsened yesterday after work, she complained of nausea, vomiting, weakness with left lower abdominal pain and radiation to her groin was different from prior kidney stones, so she presented to the ED for further evaluation and management.  ED Course:  In the emergency department, patient was febrile with a temperature of 101.41F, tachycardic, BP was 121/66, O2 sat was 92% on room air and respiratory rate was 19.  Workup in the ED showed WBC 12.6, hemoglobin 15.8, hematocrit 46.8, MCV 90.9, platelets 229.  BMP was normal except for blood glucose of 128.  Lipase 34, urinalysis showed moderate leukocytes and greater than 50 WBC, lactic acid 3.4 > 1.8.  Blood culture showed pending. CT abdomen and pelvis with contrast showed 9 mm proximal left ureteral stone with moderate left hydronephrosis and perinephric stranding She was treated with pain medication, IV hydration was provided and IV Zosyn was given. Urologist on-call (Dr. Mena Goes) was consulted and recommended that patient can be stented here at [facility] and that she can be admitted to the hospitalist service. Hospitalist was asked to admit patient for further evaluation and management.   Review of Systems: Review of systems as noted in the HPI. All other systems reviewed and are negative.   Past Medical History:  Diagnosis Date    Anxiety    Arthritis    osteo in knees and ankles    Constipation    Edema, lower extremity    Fatty liver    GERD (gastroesophageal reflux disease)    Headache    occ    High cholesterol    History of colon polyps    2013  hyperplastic   History of esophagitis    2013-- gerd   History of kidney stones    Hypertension    IBS (irritable bowel syndrome)    Joint pain    Left ureteral stone    Nephrolithiasis    left non-obstuctive per ct 06-22-2015   Osteoarthritis    Sleep apnea    cpap  auto starts at 2 may go up to 13    Vertigo    Vitamin D deficiency    Past Surgical History:  Procedure Laterality Date   CARDIOVASCULAR STRESS TEST  12-26-2012   normal perfusion study/  no ischemia or scar   CESAREAN SECTION  1987   w/  Bilateral Tubal Ligation   CHOLECYSTECTOMY N/A 07/31/2013   Procedure: LAPAROSCOPIC CHOLECYSTECTOMY;  Surgeon: Dalia Heading, MD;  Location: [facility];  Service: General;  Laterality: N/A;   COLONOSCOPY WITH ESOPHAGOGASTRODUODENOSCOPY (EGD)  04/25/2012   Procedure: COLONOSCOPY WITH ESOPHAGOGASTRODUODENOSCOPY (EGD);  Surgeon: Malissa Hippo, MD;  Location: [facility];  Service: Endoscopy;  Laterality: N/A;  730   CYSTOSCOPY W/ URETERAL STENT PLACEMENT Left 08/08/2015   Procedure: LEFT STENT REPLACEMENT;  Surgeon: Malen Gauze, MD;  Location: [facility];  Service:  Urology;  Laterality: Left;   CYSTOSCOPY WITH RETROGRADE PYELOGRAM, URETEROSCOPY AND STENT PLACEMENT Left 07/25/2015   Procedure: CYSTOSCOPY WITH LEFT RETROGRADE,;  Surgeon: Malen Gauze, MD;  Location: [facility];  Service: Urology;  Laterality: Left;   CYSTOSCOPY/RETROGRADE/URETEROSCOPY/STONE EXTRACTION WITH BASKET Left 08/08/2015   Procedure: CYSTOSCOPY LEFT RETROGRADE/URETEROSCOPY/STONE EXTRACTION WITH BASKET;  Surgeon: Malen Gauze, MD;  Location: [facility];  Service: Urology;  Laterality: Left;   DILATION AND CURETTAGE OF UTERUS  1986   HOLMIUM  LASER APPLICATION Left 07/25/2015   Procedure: LEFT HOLMIUM LASER STONE EXTRACTION  AND LEFT STENT PLACEMENT;  Surgeon: Malen Gauze, MD;  Location: [facility];  Service: Urology;  Laterality: Left;   HOLMIUM LASER APPLICATION Left 08/08/2015   Procedure: HOLMIUM LASER APPLICATION;  Surgeon: Malen Gauze, MD;  Location: [facility];  Service: Urology;  Laterality: Left;   LAPAROSCOPIC GASTRIC SLEEVE RESECTION WITH HIATAL HERNIA REPAIR N/A 05/14/2016   Procedure: LAPAROSCOPIC GASTRIC SLEEVE RESECTION WITH HIATAL HERNIA REPAIR;  Surgeon: Luretha Murphy, MD;  Location: [facility];  Service: General;  Laterality: N/A;   LIVER BIOPSY N/A 07/31/2013   Procedure: LIVER BIOPSY;  Surgeon: Dalia Heading, MD;  Location: [facility];  Service: General;  Laterality: N/A;   MALONEY DILATION  04/25/2012   Procedure: Elease Hashimoto DILATION;  Surgeon: Malissa Hippo, MD;  Location: [facility];  Service: Endoscopy;  Laterality: N/A;   TONSILLECTOMY  1967   TRANSTHORACIC ECHOCARDIOGRAM  12-26-2012   mild LVH,  ef 55-60%,  grade 1 diastolic dysfunction   TUBAL LIGATION      Social History:  reports that she quit smoking about 41 years ago. Her smoking use included cigarettes. She started smoking about 45 years ago. She has a 2 pack-year smoking history. She has never used smokeless tobacco. She reports current alcohol use. She reports that she does not use drugs.   Allergies  Allergen Reactions   Bee Venom Swelling   Macrobid [Nitrofurantoin] Rash    Family History  Problem Relation Age of Onset   Alzheimer's disease Mother    Thyroid disease Mother    Cancer Mother    Sudden death Mother    Stroke Mother    Stroke Father    Heart attack Father    Diabetes Father    High blood pressure Father    High Cholesterol Father    Heart disease Father    Alzheimer's disease Sister    Diabetes Brother    Colon cancer Paternal Uncle      Prior to Admission medications   Medication Sig Start  Date End Date Taking? Authorizing Provider  acetaminophen (TYLENOL) 500 MG tablet Take 1,000 mg by mouth every 6 (six) hours as needed for headache.     [provider]  albuterol (VENTOLIN HFA) 108 (90 Base) MCG/ACT inhaler INHALE 1-2 puffs INTO THE LUNGS EVERY 6 HOURS AS NEEDED FOR WHEEZING OR SHORTNESS OF BREATH 12/18/22   Coralyn Helling, MD  busPIRone (BUSPAR) 5 MG tablet Take 1 tablet (5 mg total) by mouth 2 (two) times daily. 10/17/22     loratadine (CLARITIN) 10 MG tablet Take 10 mg by mouth every morning.    [provider]  torsemide (DEMADEX) 10 MG tablet Take 1 tablet (10 mg total) by mouth daily as needed. 11/29/22     Vitamin D, Ergocalciferol, (DRISDOL) 1.25 MG (50000 UNIT) CAPS capsule Take 1 capsule (50,000 Units total) by mouth 2 (two) times a week. 12/06/22  Physical Exam: BP (!) 94/46   Pulse 78   Temp 97.8 F (36.6 C) (Oral)   Resp (!) 25   Wt 107 kg   SpO2 99%   BMI 40.51 kg/m   General: 62 y.o. year-old female well developed well nourished in no acute distress.  Alert and oriented x3. HEENT: NCAT, EOMI Neck: Supple, trachea medial Cardiovascular: Regular rate and rhythm with no rubs or gallops.  No thyromegaly or JVD noted.  No lower extremity edema. 2/4 pulses in all 4 extremities. Respiratory: Clear to auscultation with no wheezes or rales. Good inspiratory effort. Abdomen: Soft, nontender nondistended with normal bowel sounds x4 quadrants. Muskuloskeletal: No cyanosis, clubbing or edema noted bilaterally Neuro: CN II-XII intact, strength 5/5 x 4, sensation, reflexes intact Skin: No ulcerative lesions noted or rashes Psychiatry: Judgement and insight appear normal. Mood is appropriate for condition and setting          Labs on Admission:  Basic Metabolic Panel: Recent Labs  Lab 03/20/23 2049  NA 137  K 3.9  CL 101  CO2 24  GLUCOSE 128*  BUN 16  CREATININE 1.00  CALCIUM 9.1   Liver Function Tests: Recent Labs  Lab 03/20/23 2049   AST 17  ALT 20  ALKPHOS 66  BILITOT 0.8  PROT 7.8  ALBUMIN 4.2   Recent Labs  Lab 03/20/23 2049  LIPASE 34   No results for input(s): "AMMONIA" in the last 168 hours. CBC: Recent Labs  Lab 03/20/23 2049  WBC 12.6*  HGB 15.8*  HCT 46.8*  MCV 90.9  PLT 229   Cardiac Enzymes: No results for input(s): "CKTOTAL", "CKMB", "CKMBINDEX", "TROPONINI" in the last 168 hours.  BNP (last 3 results) No results for input(s): "BNP" in the last 8760 hours.  ProBNP (last 3 results) No results for input(s): "PROBNP" in the last 8760 hours.  CBG: No results for input(s): "GLUCAP" in the last 168 hours.  Radiological Exams on Admission: CT ABDOMEN PELVIS W CONTRAST  Result Date: 03/21/2023 CLINICAL DATA:  Left lower abdominal pain radiating to groin EXAM: CT ABDOMEN AND PELVIS WITH CONTRAST TECHNIQUE: Multidetector CT imaging of the abdomen and pelvis was performed using the standard protocol following bolus administration of intravenous contrast. RADIATION DOSE REDUCTION: This exam was performed according to the departmental dose-optimization program which includes automated exposure control, adjustment of the mA and/or kV according to patient size and/or use of iterative reconstruction technique. CONTRAST:  OMNIPAQUE IOHEXOL 300 MG/ML  SOLN COMPARISON:  06/22/2015 FINDINGS: Lower chest: Elevation of the right hemidiaphragm. Right base atelectasis. No effusions. Hepatobiliary: Prior cholecystectomy. Diffuse fatty infiltration of the liver. No focal hepatic abnormality. Pancreas: No focal abnormality or ductal dilatation. Spleen: No focal abnormality.  Normal size. Adrenals/Urinary Tract: Moderate left hydronephrosis and perinephric stranding due to 9 mm proximal left ureteral stone. No stones or hydronephrosis on the right. Adrenal glands and urinary bladder unremarkable. Stomach/Bowel: Normal appendix. Prior gastric sleeve. Stomach, large and small bowel grossly unremarkable.  Vascular/Lymphatic: No evidence of aneurysm or adenopathy. Reproductive: Uterus and adnexa unremarkable.  No mass. Other: No free fluid or free air. Musculoskeletal: No acute bony abnormality. IMPRESSION: 9 mm proximal left ureteral stone with moderate left hydronephrosis and perinephric stranding. Several other small nonobstructing left renal stones noted. Hepatic steatosis. Elevated right hemidiaphragm with right base atelectasis. Electronically Signed   By: Charlett Nose M.D.   On: 03/21/2023 02:08    EKG: I independently viewed the EKG done and my findings are as followed:  Sinus tachycardia at rate of 112 bpm  Assessment/Plan Present on Admission:  Sepsis due to urinary tract infection (HCC)  Principal Problem:   Sepsis due to urinary tract infection (HCC) Active Problems:   Nephrolithiasis  Severe sepsis due to urinary tract infection Patient met sepsis criteria due to being febrile, tachycardic, tachypneic and positive for leukocytosis (met SIRS criteria).  UTI was source of infection (met sepsis criteria).  Lactic acid was greater than 2.0 thereby qualifying the patient as having severe sepsis. IV hydration was provided Patient was started on IV Zosyn, we shall continue with same at this time  Acute nephrolithiasis CT abdomen and pelvis showed 9 mm proximal left ureteral stone with moderate left hydronephrosis Urologist was consulted by EDP and will consult on patient in the morning Patient will be kept n.p.o. Continue IV fentanyl 25 mcg every 2 hours as needed for moderate/severe pain  Morbid obesity (BMI of 40.51) Diet and lifestyle modification Patient may need to follow-up with outpatient PCP for weight loss program   DVT prophylaxis: SCDs  Advance Care Planning: CODE STATUS: Full code  Consults: Urology  Family Communication: Husband at bedside (all questions answered to satisfaction)  Severity of Illness: The appropriate patient status for this patient is INPATIENT.  Inpatient status is judged to be reasonable and necessary in order to provide the required intensity of service to ensure the patient's safety. The patient's presenting symptoms, physical exam findings, and initial radiographic and laboratory data in the context of their chronic comorbidities is felt to place them at high risk for further clinical deterioration. Furthermore, it is not anticipated that the patient will be medically stable for discharge from the hospital within 2 midnights of admission.   * I certify that at the point of admission it is my clinical judgment that the patient will require inpatient hospital care spanning beyond 2 midnights from the point of admission due to high intensity of service, high risk for further deterioration and high frequency of surveillance required.*  Author: Frankey Shown, DO 03/21/2023 9:16 AM  For on call review [email or web address].

## 2023-03-21 NOTE — ED Notes (Signed)
Patient reports lower L abd pain that was radiating to her groin earlier in the night. States the pain doesn't feel like her previous kidney stones. Patient also reports N/V; denies any burning with urination.

## 2023-03-21 NOTE — Anesthesia Procedure Notes (Signed)
Procedure Name: Intubation Date/Time: 03/21/2023 10:08 AM  Performed by: Julian Reil, CRNAPre-anesthesia Checklist: Patient identified, Emergency Drugs available, Suction available and Patient being monitored Patient Re-evaluated:Patient Re-evaluated prior to induction Oxygen Delivery Method: Circle system utilized Preoxygenation: Pre-oxygenation with 100% oxygen Induction Type: IV induction, Rapid sequence and Cricoid Pressure applied Laryngoscope Size: Glidescope and 3 Grade View: Grade I Tube type: Oral Tube size: 7.5 mm Number of attempts: 1 Airway Equipment and Method: Stylet and Oral airway Placement Confirmation: ETT inserted through vocal cords under direct vision, positive ETCO2 and breath sounds checked- equal and bilateral Secured at: 23 cm Tube secured with: Tape Dental Injury: Teeth and Oropharynx as per pre-operative assessment

## 2023-03-21 NOTE — Progress Notes (Signed)
Transition of Care Department [facility]) has reviewed patient and no TOC needs have been identified at this time. We will continue to monitor patient advancement through interdisciplinary progression rounds. If new patient transition needs arise, please place a TOC consult.   03/21/23 0811  TOC Brief Assessment  Insurance and Status Reviewed  Patient has primary care physician Yes  Home environment has been reviewed Lives with family.  Prior level of function: Independent.  Prior/Current Home Services No current home services  Social Determinants of Health Reivew SDOH reviewed no interventions necessary  Readmission risk has been reviewed Yes  Transition of care needs no transition of care needs at this time

## 2023-03-21 NOTE — Progress Notes (Signed)
[facility]                                                                               Marilyn Martin, is a 62 y.o. female, DOB - 01/03/1961, NFA:[id number] Admit date - 03/20/2023    Outpatient Primary MD for the patient is Ignatius Specking, MD  LOS - 0  days    Brief summary   Marilyn Martin is a 63 y.o. female with medical history significant of nephrolithiasis and anxiety who presents to the emergency department due to left flank pain which started on Saturday (10/12), pain improved on Sunday and she still continued to have some pain and chills without fever at home on Monday and Tuesday.  Symptoms worsened yesterday after work, she complained of nausea, vomiting, weakness with left lower abdominal pain and radiation to her groin was different from prior kidney stones, so she presented to the ED for further evaluation and management.   CT abdomen and pelvis with contrast showed 9 mm proximal left ureteral stone with moderate left hydronephrosis and perinephric stranding. Several other small nonobstructing left renal stones noted.  Urologist on-call (Dr. Eskridge) was consulted and recommended that patient can be stented here at [facility] and that she can be admitted to the hospitalist service. Hospitalist was asked to admit patient for further evaluation and management.  Assessment & Plan    Assessment and Plan:  Severe Sepsis from obstructive stone causing UTI Pt febrile, tachycardic, tachypnea, leukocytosis, hypotensive,  elevated lactic acid and UTI .  She was started on IV antibiotics and IV fluids.  Trend lactic acid.  Leukocytosis on admission.  Moderate leukocytosis on UA with pyuria, .  Please follow up urine and blood cultures .    Acute nephrolithiasis with obstructing stone, leading to left mod hydronephrosis Urology consulted, and she underwent cystoscopy and JJ stent placement.  Pain control .  Renal parameters are wnl.   Estimated body mass  index is 39.27 kg/m as calculated from the following:   Height as of this encounter: 5\' 5" (1.651 m).   Weight as of this encounter: 107 kg.  Code Status: full code.  DVT Prophylaxis:  SCDs Start: 03/21/23 0913 Place and maintain sequential compression device Start: 03/21/23 0742   Level of Care: Level of care: Med-Surg Family Communication: none at bedside.   Disposition Plan:     Remains inpatient appropriate:  IV antibiotics and IV fluids  Procedures:  cystoscopy  with left retrograde pyelography, and left JJ stent placement.   Consultants:   urology  Antimicrobials:   Anti-infectives (From admission, onward)    Start     Dose/Rate Route Frequency Ordered Stop   03/21/23 1000  piperacillin-tazobactam (ZOSYN) IVPB 3.375 g        3.375 g 12.5 mL/hr over 240 Minutes Intravenous Every 8 hours 03/21/23 0914     10 /17/24 0115  piperacillin-tazobactam (ZOSYN) IVPB 3.375 g        3.375 g 100 mL/hr over 30 Minutes Intravenous  Once 03/21/23 0108 03/21/23 0208        Medications  Scheduled Meds: Continuous Infusions:  piperacillin-tazobactam (ZOSYN)  IV Stopped (03/21/23 1330)  PRN Meds:.acetaminophen **OR** acetaminophen, fentaNYL (SUBLIMAZE) injection, ondansetron **OR** ondansetron (ZOFRAN) IV    Subjective:   Marilyn Martin was seen and examined today. Pain not well controlled.   Objective:   Vitals:   03/21/23 1045 03/21/23 1047 03/21/23 1103 03/21/23 1206  BP: (!) 82/43 (!) 88/55 (!) 98/44 (!) 103/59  Pulse: 90 85 79 75  Resp: (!) 21 (!) 29 (!) 28 19  Temp:    97.6 F (36.4 C)  TempSrc:      SpO2: 93% 95% 93% 95%  Weight:      Height:        Intake/Output Summary (Last 24 hours) at 03/21/2023 1520 Last data filed at 03/21/2023 1511 Gross per 24 hour  Intake 1450 ml  Output --  Net 1450 ml   Filed Weights   03/20/23 2035 03/21/23 0929  Weight: 107 kg 107 kg     Exam General: Alert and oriented x 3, NAD Cardiovascular: S1 S2 auscultated,  no murmurs, RRR Respiratory: Clear to auscultation bilaterally, no wheezing, rales or rhonchi Gastrointestinal: Soft, nontender, nondistended, + bowel sounds Ext: no pedal edema bilaterally Neuro: AAOx3, Cr N's II- XII. Strength 5/5 upper and lower extremities bilaterally Skin: No rashes Psych: Normal affect and demeanor, alert and oriented x3    Data Reviewed:  I have personally reviewed following labs and imaging studies   CBC Lab Results  Component Value Date   WBC 12.6 (H) 03/20/2023   RBC 5.15 (H) 03/20/2023   HGB 15.8 (H) 03/20/2023   HCT 46.8 (H) 03/20/2023   MCV 90.9 03/20/2023   MCH 30.7 03/20/2023   PLT 229 03/20/2023   MCHC 33.8 03/20/2023   RDW 13.0 03/20/2023   LYMPHSABS 0.8 05/15/2016   MONOABS 0.4 05/15/2016   EOSABS 0.0 05/15/2016   BASOSABS 0.0 05/15/2016     Last metabolic panel Lab Results  Component Value Date   NA 137 03/20/2023   K 3.9 03/20/2023   CL 101 03/20/2023   CO2 24 03/20/2023   BUN 16 03/20/2023   CREATININE 1.00 03/20/2023   GLUCOSE 128 (H) 03/20/2023   GFRNONAA >60 03/20/2023   GFRAA 85 02/22/2020   CALCIUM 9.1 03/20/2023   PROT 7.8 03/20/2023   ALBUMIN 4.2 03/20/2023   LABGLOB 2.5 01/11/2022   AGRATIO 1.8 01/11/2022   BILITOT 0.8 03/20/2023   ALKPHOS 66 03/20/2023   AST 17 03/20/2023   ALT 20 03/20/2023   ANIONGAP 12 03/20/2023    CBG (last 3)  No results for input(s): "GLUCAP" in the last 72 hours.    Coagulation Profile: No results for input(s): "INR", "PROTIME" in the last 168 hours.   Radiology Studies: DG C-Arm 1-60 Min-No Report  Result Date: 03/21/2023 Fluoroscopy was utilized by the requesting physician.  No radiographic interpretation.   CT ABDOMEN PELVIS W CONTRAST  Result Date: 03/21/2023 CLINICAL DATA:  Left lower abdominal pain radiating to groin EXAM: CT ABDOMEN AND PELVIS WITH CONTRAST TECHNIQUE: Multidetector CT imaging of the abdomen and pelvis was performed using the standard protocol  following bolus administration of intravenous contrast. RADIATION DOSE REDUCTION: This exam was performed according to the departmental dose-optimization program which includes automated exposure control, adjustment of the mA and/or kV according to patient size and/or use of iterative reconstruction technique. CONTRAST:  OMNIPAQUE IOHEXOL 300 MG/ML  SOLN COMPARISON:  06/22/2015 FINDINGS: Lower chest: Elevation of the right hemidiaphragm. Right base atelectasis. No effusions. Hepatobiliary: Prior cholecystectomy. Diffuse fatty infiltration of the liver. No focal hepatic abnormality. Pancreas:  No focal abnormality or ductal dilatation. Spleen: No focal abnormality.  Normal size. Adrenals/Urinary Tract: Moderate left hydronephrosis and perinephric stranding due to 9 mm proximal left ureteral stone. No stones or hydronephrosis on the right. Adrenal glands and urinary bladder unremarkable. Stomach/Bowel: Normal appendix. Prior gastric sleeve. Stomach, large and small bowel grossly unremarkable. Vascular/Lymphatic: No evidence of aneurysm or adenopathy. Reproductive: Uterus and adnexa unremarkable.  No mass. Other: No free fluid or free air. Musculoskeletal: No acute bony abnormality. IMPRESSION: 9 mm proximal left ureteral stone with moderate left hydronephrosis and perinephric stranding. Several other small nonobstructing left renal stones noted. Hepatic steatosis. Elevated right hemidiaphragm with right base atelectasis. Electronically Signed   By: Charlett Nose M.D.   On: 03/21/2023 02:08       Kathlen Mody M.D. [facility] 03/21/2023, 3:20 PM  Available via Epic secure chat 7am-7pm After 7 pm, please refer to night coverage provider listed on amion.

## 2023-03-21 NOTE — Consult Note (Addendum)
Urology Consult  Referring physician: Dr. Thomes Dinning Reason for referral: left ureteral stone, fever  Chief Complaint: left flank pain  History of Present Illness:  Marilyn Martin is a 61yo with a history of nephrolithiasis who presented to the ER this morning with a 5 day history of worsening abdominal pain. The pain started on Saturday and is locate din her lower left abdomen. She developed nausea and vomiting yesterday. She had low grade fevers at home. She denies any significant LUTS. No hematuria or dysuria. CT obtained int he ER shows a 9mm left proximal ureteral calculus and a 3-76mm left renal calculus with mild to moderate left hydronephrosis. WBC count 12.8. Lactic acid was initially 3.4 but has decreased to 1.8 with antibiotic and hydration. Temp 101.4 in the ER  Past Medical History:  Diagnosis Date   Anxiety    Arthritis    osteo in knees and ankles    Constipation    Edema, lower extremity    Fatty liver    GERD (gastroesophageal reflux disease)    Headache    occ    High cholesterol    History of colon polyps    2013  hyperplastic   History of esophagitis    2013-- gerd   History of kidney stones    Hypertension    IBS (irritable bowel syndrome)    Joint pain    Left ureteral stone    Nephrolithiasis    left non-obstuctive per ct 06-22-2015   Osteoarthritis    Sleep apnea    cpap  auto starts at 2 may go up to 13    Vertigo    Vitamin D deficiency    Past Surgical History:  Procedure Laterality Date   CARDIOVASCULAR STRESS TEST  12-26-2012   normal perfusion study/  no ischemia or scar   CESAREAN SECTION  1987   w/  Bilateral Tubal Ligation   CHOLECYSTECTOMY N/A 07/31/2013   Procedure: LAPAROSCOPIC CHOLECYSTECTOMY;  Surgeon: Dalia Heading, MD;  Location: [facility];  Service: General;  Laterality: N/A;   COLONOSCOPY WITH ESOPHAGOGASTRODUODENOSCOPY (EGD)  04/25/2012   Procedure: COLONOSCOPY WITH ESOPHAGOGASTRODUODENOSCOPY (EGD);  Surgeon: Malissa Hippo, MD;   Location: [facility];  Service: Endoscopy;  Laterality: N/A;  730   CYSTOSCOPY W/ URETERAL STENT PLACEMENT Left 08/08/2015   Procedure: LEFT STENT REPLACEMENT;  Surgeon: Malen Gauze, MD;  Location: [facility];  Service: Urology;  Laterality: Left;   CYSTOSCOPY WITH RETROGRADE PYELOGRAM, URETEROSCOPY AND STENT PLACEMENT Left 07/25/2015   Procedure: CYSTOSCOPY WITH LEFT RETROGRADE,;  Surgeon: Malen Gauze, MD;  Location: [facility];  Service: Urology;  Laterality: Left;   CYSTOSCOPY/RETROGRADE/URETEROSCOPY/STONE EXTRACTION WITH BASKET Left 08/08/2015   Procedure: CYSTOSCOPY LEFT RETROGRADE/URETEROSCOPY/STONE EXTRACTION WITH BASKET;  Surgeon: Malen Gauze, MD;  Location: [facility];  Service: Urology;  Laterality: Left;   DILATION AND CURETTAGE OF UTERUS  1986   HOLMIUM LASER APPLICATION Left 07/25/2015   Procedure: LEFT HOLMIUM LASER STONE EXTRACTION  AND LEFT STENT PLACEMENT;  Surgeon: Malen Gauze, MD;  Location: [facility];  Service: Urology;  Laterality: Left;   HOLMIUM LASER APPLICATION Left 08/08/2015   Procedure: HOLMIUM LASER APPLICATION;  Surgeon: Malen Gauze, MD;  Location: [facility];  Service: Urology;  Laterality: Left;   LAPAROSCOPIC GASTRIC SLEEVE RESECTION WITH HIATAL HERNIA REPAIR N/A 05/14/2016   Procedure: LAPAROSCOPIC GASTRIC SLEEVE RESECTION WITH HIATAL HERNIA REPAIR;  Surgeon: Luretha Murphy, MD;  Location: [facility];  Service: General;  Laterality: N/A;   LIVER BIOPSY N/A 07/31/2013   Procedure: LIVER BIOPSY;  Surgeon: Dalia Heading, MD;  Location: [facility];  Service: General;  Laterality: N/A;   MALONEY DILATION  04/25/2012   Procedure: Elease Hashimoto DILATION;  Surgeon: Malissa Hippo, MD;  Location: [facility];  Service: Endoscopy;  Laterality: N/A;   TONSILLECTOMY  1967   TRANSTHORACIC ECHOCARDIOGRAM  12-26-2012   mild LVH,  ef 55-60%,  grade 1 diastolic dysfunction   TUBAL LIGATION      Medications: I have  reviewed the patient's current medications. Allergies:  Allergies  Allergen Reactions   Bee Venom Swelling   Macrobid [Nitrofurantoin] Rash    Family History  Problem Relation Age of Onset   Alzheimer's disease Mother    Thyroid disease Mother    Cancer Mother    Sudden death Mother    Stroke Mother    Stroke Father    Heart attack Father    Diabetes Father    High blood pressure Father    High Cholesterol Father    Heart disease Father    Alzheimer's disease Sister    Diabetes Brother    Colon cancer Paternal Uncle    Social History:  reports that she quit smoking about 41 years ago. Her smoking use included cigarettes. She started smoking about 45 years ago. She has a 2 pack-year smoking history. She has never used smokeless tobacco. She reports current alcohol use. She reports that she does not use drugs.  Review of Systems  Constitutional:  Positive for chills and fever.  Gastrointestinal:  Positive for abdominal pain.  All other systems reviewed and are negative.   Physical Exam:  Vital signs in last 24 hours: Temp:  [97.8 F (36.6 C)-101.4 F (38.6 C)] 98.5 F (36.9 C) (10/17 0929) Pulse Rate:  [78-119] 85 (10/17 0929) Resp:  [18-37] 28 (10/17 0929) BP: (94-144)/(46-79) 112/79 (10/17 0929) SpO2:  [87 %-99 %] 95 % (10/17 0929) Weight:  [284 kg] 107 kg (10/17 0929) Physical Exam Vitals reviewed.  Constitutional:      Appearance: Normal appearance.  HENT:     Head: Normocephalic and atraumatic.     Nose: Nose normal. No congestion or rhinorrhea.  Eyes:     Extraocular Movements: Extraocular movements intact.     Pupils: Pupils are equal, round, and reactive to light.  Cardiovascular:     Rate and Rhythm: Normal rate and regular rhythm.  Pulmonary:     Effort: Pulmonary effort is normal. No respiratory distress.  Abdominal:     General: Abdomen is flat. There is no distension.  Musculoskeletal:        General: No swelling. Normal range of motion.      Cervical back: Normal range of motion and neck supple.  Skin:    General: Skin is warm and dry.  Neurological:     General: No focal deficit present.     Mental Status: She is alert and oriented to person, place, and time.  Psychiatric:        Mood and Affect: Mood normal.        Behavior: Behavior normal.        Thought Content: Thought content normal.        Judgment: Judgment normal.     Laboratory Data:  Results for orders placed or performed during the hospital encounter of 03/20/23 (from the past 72 hour(s))  Urinalysis, Routine w reflex microscopic -Urine, Clean Catch     Status: Abnormal  Collection Time: 03/20/23 12:13 AM  Result Value Ref Range   Color, Urine YELLOW YELLOW   APPearance CLEAR CLEAR   Specific Gravity, Urine 1.018 1.005 - 1.030   pH 5.0 5.0 - 8.0   Glucose, UA NEGATIVE NEGATIVE mg/dL   Hgb urine dipstick MODERATE (A) NEGATIVE   Bilirubin Urine NEGATIVE NEGATIVE   Ketones, ur NEGATIVE NEGATIVE mg/dL   Protein, ur 546 (A) NEGATIVE mg/dL   Nitrite NEGATIVE NEGATIVE   Leukocytes,Ua MODERATE (A) NEGATIVE   RBC / HPF 21-50 0 - 5 RBC/hpf   WBC, UA >50 0 - 5 WBC/hpf   Bacteria, UA NONE SEEN NONE SEEN   Squamous Epithelial / HPF 0-5 0 - 5 /HPF    Comment: Performed at [facility], [address], [place], [place] [address]  Lipase, blood     Status: None   Collection Time: 03/20/23  8:49 PM  Result Value Ref Range   Lipase 34 11 - 51 U/L    Comment: Performed at [facility], [address], [place], [place] [address]  Comprehensive metabolic panel     Status: Abnormal   Collection Time: 03/20/23  8:49 PM  Result Value Ref Range   Sodium 137 135 - 145 mmol/L   Potassium 3.9 3.5 - 5.1 mmol/L   Chloride 101 98 - 111 mmol/L   CO2 24 22 - 32 mmol/L   Glucose, Bld 128 (H) 70 - 99 mg/dL    Comment: Glucose reference range applies only to samples taken after fasting for at least 8 hours.   BUN 16 8 - 23 mg/dL   Creatinine, Ser 1.82 0.44 - 1.00 mg/dL    Calcium 9.1 8.9 - 99.3 mg/dL   Total Protein 7.8 6.5 - 8.1 g/dL   Albumin 4.2 3.5 - 5.0 g/dL   AST 17 15 - 41 U/L   ALT 20 0 - 44 U/L   Alkaline Phosphatase 66 38 - 126 U/L   Total Bilirubin 0.8 0.3 - 1.2 mg/dL   GFR, Estimated >71 >69 mL/min    Comment: (NOTE) Calculated using the CKD-EPI Creatinine Equation (2021)    Anion gap 12 5 - 15    Comment: Performed at [facility], [address] [place], [place] [address]  CBC     Status: Abnormal   Collection Time: 03/20/23  8:49 PM  Result Value Ref Range   WBC 12.6 (H) 4.0 - 10.5 K/uL   RBC 5.15 (H) 3.87 - 5.11 MIL/uL   Hemoglobin 15.8 (H) 12.0 - 15.0 g/dL   HCT 81.0 (H) 17.5 - 10.2 %   MCV 90.9 80.0 - 100.0 fL   MCH 30.7 26.0 - 34.0 pg   MCHC 33.8 30.0 - 36.0 g/dL   RDW 58.5 27.7 - 82.4 %   Platelets 229 150 - 400 K/uL   nRBC 0.0 0.0 - 0.2 %    Comment: Performed at [facility], [address] [place], [place] [address]  Lactic acid, plasma     Status: Abnormal   Collection Time: 03/21/23  1:00 AM  Result Value Ref Range   Lactic Acid, Venous 3.4 (HH) 0.5 - 1.9 mmol/L    Comment: CRITICAL RESULT CALLED TO, READ BACK BY AND VERIFIED WITH K. NICHOLS AT 0139 ON 10.17.24 BY ADGER J Performed at [facility], [address] [place], [place] [address]   Blood culture (routine x 2)     Status: None (Preliminary result)   Collection Time: 03/21/23  1:00 AM   Specimen:  Left Antecubital; Blood  Result Value Ref Range   Specimen Description LEFT ANTECUBITAL    Special Requests BOTTLES DRAWN AEROBIC AND ANAEROBIC    Culture      NO GROWTH < 12 HOURS Performed at [facility], [address], [place], [place] [address]    Report Status PENDING   Blood culture (routine x 2)     Status: None (Preliminary result)   Collection Time: 03/21/23  1:34 AM   Specimen: BLOOD  Result Value Ref Range   Specimen Description BLOOD RIGHT ANTECUBITAL    Special Requests      BOTTLES DRAWN AEROBIC AND ANAEROBIC Blood Culture results may not be  optimal due to an excessive volume of blood received in culture bottles   Culture      NO GROWTH < 12 HOURS Performed at [facility], [address] [place], [place] [address]    Report Status PENDING   Lactic acid, plasma     Status: None   Collection Time: 03/21/23  3:04 AM  Result Value Ref Range   Lactic Acid, Venous 1.8 0.5 - 1.9 mmol/L    Comment: Performed at [facility], [address], [place], [place] [address]   Recent Results (from the past 240 hour(s))  Blood culture (routine x 2)     Status: None (Preliminary result)   Collection Time: 03/21/23  1:00 AM   Specimen: Left Antecubital; Blood  Result Value Ref Range Status   Specimen Description LEFT ANTECUBITAL  Final   Special Requests BOTTLES DRAWN AEROBIC AND ANAEROBIC  Final   Culture   Final    NO GROWTH < 12 HOURS Performed at [facility], [address], [place], [place] [address]    Report Status PENDING  Incomplete  Blood culture (routine x 2)     Status: None (Preliminary result)   Collection Time: 03/21/23  1:34 AM   Specimen: BLOOD  Result Value Ref Range Status   Specimen Description BLOOD RIGHT ANTECUBITAL  Final   Special Requests   Final    BOTTLES DRAWN AEROBIC AND ANAEROBIC Blood Culture results may not be optimal due to an excessive volume of blood received in culture bottles   Culture   Final    NO GROWTH < 12 HOURS Performed at [facility], [address] [place], [place] [address]    Report Status PENDING  Incomplete   Creatinine: Recent Labs    03/20/23 2049  CREATININE 1.00   Baseline Creatinine: 1  Impression/Assessment:  61yo with left ureteral stone, fever  Plan:  -We discussed the management of kidney stones. These options include observation, ureteroscopy, shockwave lithotripsy ([facility]) and percutaneous nephrolithotomy (PCNL). We discussed which options are relevant to the patient's stone(s). We discussed the natural history of kidney stones as well as the complications of  untreated stones and the impact on quality of life without treatment as well as with each of the above listed treatments. We also discussed the efficacy of each treatment in its ability to clear the stone burden. With any of these management options I discussed the signs and symptoms of infection and the need for emergent treatment should these be experienced. For each option we discussed the ability of each procedure to clear the patient of their stone burden.   For observation I described the risks which include but are not limited to silent renal damage, life-threatening infection, need for emergent surgery, failure to pass stone and pain.   For ureteroscopy I described the risks  which include bleeding, infection, damage to contiguous structures, positioning injury, ureteral stricture, ureteral avulsion, ureteral injury, need for prolonged ureteral stent, inability to perform ureteroscopy, need for an interval procedure, inability to clear stone burden, stent discomfort/pain, heart attack, stroke, pulmonary embolus and the inherent risks with general anesthesia.   For shockwave lithotripsy I described the risks which include arrhythmia, kidney contusion, kidney hemorrhage, need for transfusion, pain, inability to adequately break up stone, inability to pass stone fragments, Steinstrasse, infection associated with obstructing stones, need for alternate surgical procedure, need for repeat shockwave lithotripsy, MI, CVA, PE and the inherent risks with anesthesia/conscious sedation.   For PCNL I described the risks including positioning injury, pneumothorax, hydrothorax, need for chest tube, inability to clear stone burden, renal laceration, arterial venous fistula or malformation, need for embolization of kidney, loss of kidney or renal function, need for repeat procedure, need for prolonged nephrostomy tube, ureteral avulsion, MI, CVA, PE and the inherent risks of general anesthesia.   - The patient would  like to proceed with left ureteral stent placement  Marilyn Martin 03/21/2023, 9:43 AM

## 2023-03-21 NOTE — Anesthesia Preprocedure Evaluation (Signed)
Anesthesia Evaluation  Patient identified by MRN, date of birth, ID band Patient awake    Reviewed: Allergy & Precautions, H&P , NPO status , Patient's Chart, lab work & pertinent test results, reviewed documented beta blocker date and time   Airway Mallampati: II  TM Distance: >3 FB Neck ROM: full    Dental no notable dental hx.    Pulmonary sleep apnea and Continuous Positive Airway Pressure Ventilation , former smoker   Pulmonary exam normal breath sounds clear to auscultation       Cardiovascular Exercise Tolerance: Good hypertension, negative cardio ROS  Rhythm:regular Rate:Normal     Neuro/Psych  Headaches  Anxiety     negative neurological ROS  negative psych ROS   GI/Hepatic negative GI ROS, Neg liver ROS,GERD  ,,  Endo/Other  negative endocrine ROS    Renal/GU Renal diseasenegative Renal ROS  negative genitourinary   Musculoskeletal   Abdominal   Peds  Hematology negative hematology ROS (+)   Anesthesia Other Findings Hemidiaphragm paralysis  Reproductive/Obstetrics negative OB ROS                             Anesthesia Physical Anesthesia Plan  ASA: 3  Anesthesia Plan: General and General ETT   Post-op Pain Management:    Induction:   PONV Risk Score and Plan: Ondansetron  Airway Management Planned:   Additional Equipment:   Intra-op Plan:   Post-operative Plan:   Informed Consent: I have reviewed the patients History and Physical, chart, labs and discussed the procedure including the risks, benefits and alternatives for the proposed anesthesia with the patient or authorized representative who has indicated his/her understanding and acceptance.     Dental Advisory Given  Plan Discussed with: CRNA  Anesthesia Plan Comments:        Anesthesia Quick Evaluation

## 2023-03-21 NOTE — Op Note (Signed)
.  Preoperative diagnosis: Left ureteral stone, fever  Postoperative diagnosis: Same  Procedure: 1 cystoscopy 2. Left retrograde pyelography 3.  Intraoperative fluoroscopy, under one hour, with interpretation 4. Left 6 x 26 JJ stent placement  Attending: Wilkie Aye  Anesthesia: General  Estimated blood loss: None  Drains: Left 6 x 26 JJ ureteral stent without tether, 16 French foley catheter  Specimens: none  Antibiotics: zosyn  Findings: left proximal ureteral stone. Moderate hydronephrosis. No masses/lesions in the bladder. Ureteral orifices in normal anatomic location.  Indications: Patient is a 62 year old female with a history of left ureteral stone and concern for sepsis.  After discussing treatment options, they decided proceed with left stent placement.  Procedure in detail: The patient was brought to the operating room and a brief timeout was done to ensure correct patient, correct procedure, correct site.  General anesthesia was administered patient was placed in dorsal lithotomy position.  Their genitalia was then prepped and draped in usual sterile fashion.  A rigid 22 French cystoscope was passed in the urethra and the bladder.  Bladder was inspected free masses or lesions.  the ureteral orifices were in the normal orthotopic locations.  a 6 french ureteral catheter was then instilled into the left ureteral orifice.  a gentle retrograde was obtained and findings noted above.  we then placed a zip wire through the ureteral catheter and advanced up to the renal pelvis.    We then placed a 6 x 26 double-j ureteral stent over the original zip wire.  We then removed the wire and good coil was noted in the the renal pelvis under fluoroscopy and the bladder under direct vision.  A foley catheter was then placed. the bladder was then drained and this concluded the procedure which was well tolerated by patient.  Complications: None  Condition: Stable, extubated, transferred to  PACU  Plan: Patient is to be admitted for IV antibiotics. Her foley can be removed tomorrow morning. She will be scheduled for ureteral stone extraction in 2 weeks after completing course of culture specific antibiotics

## 2023-03-21 NOTE — Transfer of Care (Signed)
Immediate Anesthesia Transfer of Care Note  Patient: Marilyn Martin  Procedure(s) Performed: CYSTOSCOPY WITH RETROGRADE PYELOGRAM/URETERAL STENT PLACEMENT (Left: Ureter)  Patient Location: PACU  Anesthesia Type:General  Level of Consciousness: awake, alert , and oriented  Airway & Oxygen Therapy: Patient Spontanous Breathing and Patient connected to face mask oxygen  Post-op Assessment: Report given to RN and Post -op Vital signs reviewed and stable  Post vital signs: Reviewed and stable  Last Vitals:  Vitals Value Taken Time  BP    Temp    Pulse 88 03/21/23 1040  Resp    SpO2 92 % 03/21/23 1040  Vitals shown include unfiled device data.  Last Pain:  Vitals:   03/21/23 0929  TempSrc: Oral  PainSc: 2          Complications: No notable events documented.

## 2023-03-21 NOTE — ED Notes (Signed)
Patient transported to CT 

## 2023-03-21 NOTE — Progress Notes (Signed)
Pharmacy Antibiotic Note  Marilyn Martin is a 62 y.o. female admitted on 03/20/2023 with UTI and intra-abdominal infection .  Pharmacy has been consulted for zosyn dosing.  Plan: Zosyn 3.375g IV q8h (4 hour infusion).  Height: 5\' 5"  (165.1 cm) Weight: 107 kg (236 lb) IBW/kg (Calculated) : 57  Temp (24hrs), Avg:99.2 F (37.3 C), Min:97.8 F (36.6 C), Max:101.4 F (38.6 C)  Recent Labs  Lab 03/20/23 2049 03/21/23 0100 03/21/23 0304  WBC 12.6*  --   --   CREATININE 1.00  --   --   LATICACIDVEN  --  3.4* 1.8    Estimated Creatinine Clearance: 70 mL/min (by C-G formula based on SCr of 1 mg/dL).    Allergies  Allergen Reactions   Bee Venom Swelling   Macrobid [Nitrofurantoin] Rash    Antimicrobials this admission: Zosyn 10/17 >>   Microbiology results: 10/17 BCx: pending  Thank you for allowing pharmacy to be a part of this patient's care.  Tad Moore 03/21/2023 10:22 AM

## 2023-03-22 ENCOUNTER — Encounter (HOSPITAL_COMMUNITY): Payer: Self-pay | Admitting: Urology

## 2023-03-22 DIAGNOSIS — D72829 Elevated white blood cell count, unspecified: Secondary | ICD-10-CM | POA: Diagnosis not present

## 2023-03-22 DIAGNOSIS — N200 Calculus of kidney: Secondary | ICD-10-CM | POA: Diagnosis not present

## 2023-03-22 DIAGNOSIS — B9620 Unspecified Escherichia coli [E. coli] as the cause of diseases classified elsewhere: Secondary | ICD-10-CM

## 2023-03-22 DIAGNOSIS — N390 Urinary tract infection, site not specified: Secondary | ICD-10-CM | POA: Diagnosis not present

## 2023-03-22 DIAGNOSIS — A419 Sepsis, unspecified organism: Secondary | ICD-10-CM | POA: Diagnosis not present

## 2023-03-22 LAB — CBC
HCT: 40.9 % (ref 36.0–46.0)
Hemoglobin: 13.5 g/dL (ref 12.0–15.0)
MCH: 30.3 pg (ref 26.0–34.0)
MCHC: 33 g/dL (ref 30.0–36.0)
MCV: 91.7 fL (ref 80.0–100.0)
Platelets: 178 10*3/uL (ref 150–400)
RBC: 4.46 MIL/uL (ref 3.87–5.11)
RDW: 12.9 % (ref 11.5–15.5)
WBC: 15.8 10*3/uL — ABNORMAL HIGH (ref 4.0–10.5)
nRBC: 0 % (ref 0.0–0.2)

## 2023-03-22 LAB — COMPREHENSIVE METABOLIC PANEL
ALT: 21 U/L (ref 0–44)
AST: 17 U/L (ref 15–41)
Albumin: 3.4 g/dL — ABNORMAL LOW (ref 3.5–5.0)
Alkaline Phosphatase: 54 U/L (ref 38–126)
Anion gap: 11 (ref 5–15)
BUN: 20 mg/dL (ref 8–23)
CO2: 24 mmol/L (ref 22–32)
Calcium: 8.8 mg/dL — ABNORMAL LOW (ref 8.9–10.3)
Chloride: 102 mmol/L (ref 98–111)
Creatinine, Ser: 0.99 mg/dL (ref 0.44–1.00)
GFR, Estimated: >60 mL/min (ref >60)
Glucose, Bld: 119 mg/dL — ABNORMAL HIGH (ref 70–99)
Potassium: 3.7 mmol/L (ref 3.5–5.1)
Sodium: 137 mmol/L (ref 135–145)
Total Bilirubin: 0.7 mg/dL (ref 0.3–1.2)
Total Protein: 6.8 g/dL (ref 6.5–8.1)

## 2023-03-22 LAB — BLOOD CULTURE ID PANEL (REFLEXED) - BCID2

## 2023-03-22 LAB — MAGNESIUM: Magnesium: 2.2 mg/dL (ref 1.7–2.4)

## 2023-03-22 LAB — PHOSPHORUS: Phosphorus: 3.7 mg/dL (ref 2.5–4.6)

## 2023-03-22 LAB — HIV ANTIBODY (ROUTINE TESTING W REFLEX): HIV Screen 4th Generation wRfx: NONREACTIVE

## 2023-03-22 MED ORDER — MELATONIN 3 MG PO TABS
6.0000 mg | ORAL_TABLET | Freq: Every evening | ORAL | Status: DC | PRN
  Administered 2023-03-22 – 2023-03-23 (×2): 6 mg via ORAL
  Filled 2023-03-22 (×2): qty 2

## 2023-03-22 MED ORDER — CHLORHEXIDINE GLUCONATE CLOTH 2 % EX PADS
6.0000 | MEDICATED_PAD | Freq: Every day | CUTANEOUS | Status: DC
  Administered 2023-03-22: 6 via TOPICAL

## 2023-03-22 MED ORDER — SODIUM CHLORIDE 0.9 % IV SOLN
2.0000 g | INTRAVENOUS | Status: DC
  Administered 2023-03-22 – 2023-03-24 (×3): 2 g via INTRAVENOUS
  Filled 2023-03-22 (×3): qty 20

## 2023-03-22 NOTE — Progress Notes (Signed)
   03/21/23 2340  Provider Notification  Provider Name/Title Thomes Dinning, DO  Date Provider Notified 03/21/23  Time Provider Notified 2339  Method of Notification Page  Notification Reason Critical Result  Test performed and critical result Gram negative rods  Date Critical Result Received 03/21/23  Time Critical Result Received 2335  Provider response No new orders

## 2023-03-22 NOTE — Progress Notes (Signed)
Pt had voided multiple times since foley removal

## 2023-03-22 NOTE — Progress Notes (Signed)
   03/22/23 0153  Provider Notification  Provider Name/Title Thomes Dinning, DO  Date Provider Notified 03/22/23  Time Provider Notified 0154  Method of Notification Page  Notification Reason Critical Result  Test performed and critical result BCx ID E coli and Enterobacterales  Date Critical Result Received 03/22/23  Time Critical Result Received 0150

## 2023-03-22 NOTE — Progress Notes (Signed)
[facility]                                                                               Marilyn Martin, is a 62 y.o. female, DOB - Jan 07, 1961, XBJ:[id number] Admit date - 03/20/2023    Outpatient Primary MD for the patient is Ignatius Specking, MD  LOS - 1  days    Brief summary   Marilyn Martin is a 62 y.o. female with medical history significant of nephrolithiasis and anxiety who presents to the emergency department due to left flank pain which started on Saturday (10/12), pain improved on Sunday and she still continued to have some pain and chills without fever at home on Monday and Tuesday.  Symptoms worsened yesterday after work, she complained of nausea, vomiting, weakness with left lower abdominal pain and radiation to her groin was different from prior kidney stones, so she presented to the ED for further evaluation and management.   CT abdomen and pelvis with contrast showed 9 mm proximal left ureteral stone with moderate left hydronephrosis and perinephric stranding. Several other small nonobstructing left renal stones noted.  Urologist on-call (Dr. Eskridge) was consulted and recommended that patient can be stented here at [facility] [facility] and that she can be admitted to the hospitalist service. Hospitalist was asked to admit patient for further evaluation and management.  Assessment & Plan    Assessment and Plan:  Severe Sepsis from obstructive stone causing UTI/ Ecoli bacteremia. Pt febrile, tachycardic, tachypnea, leukocytosis, hypotensive,  elevated lactic acid and UTI .  She was started on IV antibiotics and IV fluids.  Trend lactic acid.  Leukocytosis on admission.  Moderate leukocytosis on UA with pyuria, .  Blood cultures growing e coli bacteria. Follow up the sensitivities.     Hypotension  Resolved with fluid resuscitation.    Acute nephrolithiasis with obstructing stone, leading to left mod hydronephrosis Urology consulted, and she underwent  cystoscopy and JJ stent placement.  Pain control .  Renal parameters are wnl.   Persistent leukocytosis Monitor. From E coli bacteremia. Recheck cbc in am.    Estimated body mass index is 39.27 kg/m as calculated from the following:   Height as of this encounter: 5\' 5" (1.651 m).   Weight as of this encounter: 107 kg.  Code Status: full code.  DVT Prophylaxis:  SCDs Start: 03/21/23 0913 Place and maintain sequential compression device Start: 03/21/23 0742   Level of Care: Level of care: Med-Surg Family Communication: none at bedside.   Disposition Plan:     Remains inpatient appropriate:  IV antibiotics and IV fluids  Procedures:  cystoscopy  with left retrograde pyelography, and left JJ stent placement.   Consultants:   urology  Antimicrobials:   Anti-infectives (From admission, onward)    Start     Dose/Rate Route Frequency Ordered Stop   03/22/23 1200  cefTRIAXone (ROCEPHIN) 2 g in sodium chloride 0.9 % 100 mL IVPB        2 g 200 mL/hr over 30 Minutes Intravenous Every 24 hours 03/22/23 1056     10 /17/24 1000  piperacillin-tazobactam (ZOSYN) IVPB 3.375 g  Status:  Discontinued  3.375 g 12.5 mL/hr over 240 Minutes Intravenous Every 8 hours 03/21/23 0914 03/22/23 1056   03/21/23 0115  piperacillin-tazobactam (ZOSYN) IVPB 3.375 g        3.375 g 100 mL/hr over 30 Minutes Intravenous  Once 03/21/23 0108 03/21/23 0208        Medications  Scheduled Meds:  Chlorhexidine Gluconate Cloth  6 each Topical Daily   Continuous Infusions:  cefTRIAXone (ROCEPHIN)  IV     PRN Meds:.acetaminophen **OR** acetaminophen, fentaNYL (SUBLIMAZE) injection, ondansetron **OR** ondansetron (ZOFRAN) IV    Subjective:   Marilyn Martin was seen and examined today. Pain is better. No chest pain or sob. No nausea or vomiting. Some flank pain , improving. No headache or dizziness.   Objective:   Vitals:   03/21/23 1615 03/21/23 1946 03/21/23 2345 03/22/23 0530  BP: 104/67  102/65 108/68 100/74  Pulse: 80 80 76 74  Resp: 20 20 18 18   Temp: 98 F (36.7 C) 98.2 F (36.8 C) 98.3 F (36.8 C) 98 F (36.7 C)  TempSrc:   Oral   SpO2: 92% 94% 95% 94%  Weight:      Height:        Intake/Output Summary (Last 24 hours) at 03/22/2023 1134 Last data filed at 03/22/2023 0900 Gross per 24 hour  Intake 860.72 ml  Output 1200 ml  Net -339.28 ml   Filed Weights   03/20/23 2035 03/21/23 0929  Weight: 107 kg 107 kg     Exam General exam: Appears calm and comfortable  Respiratory system: Clear to auscultation. Respiratory effort normal. Cardiovascular system: S1 & S2 heard, RRR. No JVD,  Gastrointestinal system: Abdomen is nondistended, soft and nontender.  Central nervous system: Alert and oriented. Extremities: Symmetric 5 x 5 power. Skin: No rashes,  Psychiatry: Mood & affect appropriate.     Data Reviewed:  I have personally reviewed following labs and imaging studies   CBC Lab Results  Component Value Date   WBC 15.8 (H) 03/22/2023   RBC 4.46 03/22/2023   HGB 13.5 03/22/2023   HCT 40.9 03/22/2023   MCV 91.7 03/22/2023   MCH 30.3 03/22/2023   PLT 178 03/22/2023   MCHC 33.0 03/22/2023   RDW 12.9 03/22/2023   LYMPHSABS 0.8 05/15/2016   MONOABS 0.4 05/15/2016   EOSABS 0.0 05/15/2016   BASOSABS 0.0 05/15/2016     Last metabolic panel Lab Results  Component Value Date   NA 137 03/22/2023   K 3.7 03/22/2023   CL 102 03/22/2023   CO2 24 03/22/2023   BUN 20 03/22/2023   CREATININE 0.99 03/22/2023   GLUCOSE 119 (H) 03/22/2023   GFRNONAA >60 03/22/2023   GFRAA 85 02/22/2020   CALCIUM 8.8 (L) 03/22/2023   PHOS 3.7 03/22/2023   PROT 6.8 03/22/2023   ALBUMIN 3.4 (L) 03/22/2023   LABGLOB 2.5 01/11/2022   AGRATIO 1.8 01/11/2022   BILITOT 0.7 03/22/2023   ALKPHOS 54 03/22/2023   AST 17 03/22/2023   ALT 21 03/22/2023   ANIONGAP 11 03/22/2023    CBG (last 3)  No results for input(s): "GLUCAP" in the last 72 hours.    Coagulation  Profile: No results for input(s): "INR", "PROTIME" in the last 168 hours.   Radiology Studies: DG C-Arm 1-60 Min-No Report  Result Date: 03/21/2023 Fluoroscopy was utilized by the requesting physician.  No radiographic interpretation.   CT ABDOMEN PELVIS W CONTRAST  Result Date: 03/21/2023 CLINICAL DATA:  Left lower abdominal pain radiating to groin EXAM: CT ABDOMEN  AND PELVIS WITH CONTRAST TECHNIQUE: Multidetector CT imaging of the abdomen and pelvis was performed using the standard protocol following bolus administration of intravenous contrast. RADIATION DOSE REDUCTION: This exam was performed according to the departmental dose-optimization program which includes automated exposure control, adjustment of the mA and/or kV according to patient size and/or use of iterative reconstruction technique. CONTRAST:  OMNIPAQUE IOHEXOL 300 MG/ML  SOLN COMPARISON:  06/22/2015 FINDINGS: Lower chest: Elevation of the right hemidiaphragm. Right base atelectasis. No effusions. Hepatobiliary: Prior cholecystectomy. Diffuse fatty infiltration of the liver. No focal hepatic abnormality. Pancreas: No focal abnormality or ductal dilatation. Spleen: No focal abnormality.  Normal size. Adrenals/Urinary Tract: Moderate left hydronephrosis and perinephric stranding due to 9 mm proximal left ureteral stone. No stones or hydronephrosis on the right. Adrenal glands and urinary bladder unremarkable. Stomach/Bowel: Normal appendix. Prior gastric sleeve. Stomach, large and small bowel grossly unremarkable. Vascular/Lymphatic: No evidence of aneurysm or adenopathy. Reproductive: Uterus and adnexa unremarkable.  No mass. Other: No free fluid or free air. Musculoskeletal: No acute bony abnormality. IMPRESSION: 9 mm proximal left ureteral stone with moderate left hydronephrosis and perinephric stranding. Several other small nonobstructing left renal stones noted. Hepatic steatosis. Elevated right hemidiaphragm with right base  atelectasis. Electronically Signed   By: Charlett Nose M.D.   On: 03/21/2023 02:08       Kathlen Mody M.D. [facility] 03/22/2023, 11:34 AM  Available via Epic secure chat 7am-7pm After 7 pm, please refer to night coverage provider listed on amion.

## 2023-03-23 DIAGNOSIS — A419 Sepsis, unspecified organism: Secondary | ICD-10-CM | POA: Diagnosis not present

## 2023-03-23 DIAGNOSIS — N390 Urinary tract infection, site not specified: Secondary | ICD-10-CM | POA: Diagnosis not present

## 2023-03-23 DIAGNOSIS — D72829 Elevated white blood cell count, unspecified: Secondary | ICD-10-CM | POA: Diagnosis not present

## 2023-03-23 DIAGNOSIS — N200 Calculus of kidney: Secondary | ICD-10-CM | POA: Diagnosis not present

## 2023-03-23 LAB — CBC WITH DIFFERENTIAL/PLATELET
Abs Immature Granulocytes: 0.03 10*3/uL (ref 0.00–0.07)
Basophils Absolute: 0.1 10*3/uL (ref 0.0–0.1)
Basophils Relative: 1 %
Eosinophils Absolute: 0.1 10*3/uL (ref 0.0–0.5)
Eosinophils Relative: 1 %
HCT: 43 % (ref 36.0–46.0)
Hemoglobin: 14.2 g/dL (ref 12.0–15.0)
Immature Granulocytes: 0 %
Lymphocytes Relative: 18 %
Lymphs Abs: 1.8 10*3/uL (ref 0.7–4.0)
MCH: 30.3 pg (ref 26.0–34.0)
MCHC: 33 g/dL (ref 30.0–36.0)
MCV: 91.9 fL (ref 80.0–100.0)
Monocytes Absolute: 0.8 10*3/uL (ref 0.1–1.0)
Monocytes Relative: 8 %
Neutro Abs: 7.1 10*3/uL (ref 1.7–7.7)
Neutrophils Relative %: 72 %
Platelets: 226 10*3/uL (ref 150–400)
RBC: 4.68 MIL/uL (ref 3.87–5.11)
RDW: 12.8 % (ref 11.5–15.5)
WBC: 9.9 10*3/uL (ref 4.0–10.5)
nRBC: 0 % (ref 0.0–0.2)

## 2023-03-23 NOTE — Progress Notes (Signed)
1 day Post Op Subjective: Patient reports improved left flank pain. Blood cultures pending  Objective: Vital signs in last 24 hours: Temp:  [97.6 F (36.4 C)-98.2 F (36.8 C)] 97.6 F (36.4 C) (10/19 0433) Pulse Rate:  [76-83] 76 (10/19 0433) Resp:  [16] 16 (10/19 0433) BP: (97-133)/(47-78) 104/47 (10/19 0433) SpO2:  [93 %-96 %] 93 % (10/19 0433)  Intake/Output from previous day: 10/18 0701 - 10/19 0700 In: 580 [P.O.:480; IV Piggyback:100] Out: -  Intake/Output this shift: No intake/output data recorded.  Physical Exam:  General:alert, cooperative, and appears stated age GI: soft, non tender, normal bowel sounds, no palpable masses, no organomegaly, no inguinal hernia Female genitalia: not done Extremities: extremities normal, atraumatic, no cyanosis or edema  Lab Results: Recent Labs    03/20/23 2049 03/22/23 0504  HGB 15.8* 13.5  HCT 46.8* 40.9   BMET Recent Labs    03/20/23 2049 03/22/23 0504  NA 137 137  K 3.9 3.7  CL 101 102  CO2 24 24  GLUCOSE 128* 119*  BUN 16 20  CREATININE 1.00 0.99  CALCIUM 9.1 8.8*   No results for input(s): "LABPT", "INR" in the last 72 hours. No results for input(s): "LABURIN" in the last 72 hours. Results for orders placed or performed during the hospital encounter of 03/20/23  Blood culture (routine x 2)     Status: Abnormal (Preliminary result)   Collection Time: 03/21/23  1:00 AM   Specimen: Left Antecubital; Blood  Result Value Ref Range Status   Specimen Description   Final    LEFT ANTECUBITAL Performed at [facility], [address] [place], [place] [address]    Special Requests   Final    BOTTLES DRAWN AEROBIC AND ANAEROBIC Performed at [facility], [address] [place], [place] [address]    Culture  Setup Time   Final    GRAM NEGATIVE RODS ANAEROBIC BOTTLE ONLY Gram Stain Report Called to,Read Back By and Verified With: Charleston Ropes RN 2009 [id number] K FORSYTH AEROBIC BOTTLE ALSO CRITICAL RESULT CALLED TO,  READ BACK BY AND VERIFIED WITH: Charleston Ropes RN 03/22/2023  @ 0153 BY AB    Culture (A)  Final    ESCHERICHIA COLI SUSCEPTIBILITIES TO FOLLOW Performed at [facility], [name] [place], [place] [address]    Report Status PENDING  Incomplete  Blood Culture ID Panel (Reflexed)     Status: Abnormal   Collection Time: 03/21/23  1:00 AM  Result Value Ref Range Status   Enterococcus faecalis NOT DETECTED NOT DETECTED Final   Enterococcus Faecium NOT DETECTED NOT DETECTED Final   Listeria monocytogenes NOT DETECTED NOT DETECTED Final   Staphylococcus species NOT DETECTED NOT DETECTED Final   Staphylococcus aureus (BCID) NOT DETECTED NOT DETECTED Final   Staphylococcus epidermidis NOT DETECTED NOT DETECTED Final   Staphylococcus lugdunensis NOT DETECTED NOT DETECTED Final   Streptococcus species NOT DETECTED NOT DETECTED Final   Streptococcus agalactiae NOT DETECTED NOT DETECTED Final   Streptococcus pneumoniae NOT DETECTED NOT DETECTED Final   Streptococcus pyogenes NOT DETECTED NOT DETECTED Final   A.calcoaceticus-baumannii NOT DETECTED NOT DETECTED Final   Bacteroides fragilis NOT DETECTED NOT DETECTED Final   Enterobacterales DETECTED (A) NOT DETECTED Final    Comment: Enterobacterales represent a large order of gram negative bacteria, not a single organism. CRITICAL RESULT CALLED TO, READ BACK BY AND VERIFIED WITH: Charleston Ropes RN 03/22/2023  @ 0153 BY AB    Enterobacter cloacae complex NOT DETECTED NOT  DETECTED Final   Escherichia coli DETECTED (A) NOT DETECTED Final    Comment: CRITICAL RESULT CALLED TO, READ BACK BY AND VERIFIED WITH: M. REYNOLDS RN 03/22/2023  @ 0153 BY AB    Klebsiella aerogenes NOT DETECTED NOT DETECTED Final   Klebsiella oxytoca NOT DETECTED NOT DETECTED Final   Klebsiella pneumoniae NOT DETECTED NOT DETECTED Final   Proteus species NOT DETECTED NOT DETECTED Final   Salmonella species NOT DETECTED NOT DETECTED Final   Serratia marcescens NOT  DETECTED NOT DETECTED Final   Haemophilus influenzae NOT DETECTED NOT DETECTED Final   Neisseria meningitidis NOT DETECTED NOT DETECTED Final   Pseudomonas aeruginosa NOT DETECTED NOT DETECTED Final   Stenotrophomonas maltophilia NOT DETECTED NOT DETECTED Final   Candida albicans NOT DETECTED NOT DETECTED Final   Candida auris NOT DETECTED NOT DETECTED Final   Candida glabrata NOT DETECTED NOT DETECTED Final   Candida krusei NOT DETECTED NOT DETECTED Final   Candida parapsilosis NOT DETECTED NOT DETECTED Final   Candida tropicalis NOT DETECTED NOT DETECTED Final   Cryptococcus neoformans/gattii NOT DETECTED NOT DETECTED Final   CTX-M ESBL NOT DETECTED NOT DETECTED Final   Carbapenem resistance IMP NOT DETECTED NOT DETECTED Final   Carbapenem resistance KPC NOT DETECTED NOT DETECTED Final   Carbapenem resistance NDM NOT DETECTED NOT DETECTED Final   Carbapenem resist OXA 48 LIKE NOT DETECTED NOT DETECTED Final   Carbapenem resistance VIM NOT DETECTED NOT DETECTED Final    Comment: Performed at [facility], [address] [place], [place] [address]  Blood culture (routine x 2)     Status: None (Preliminary result)   Collection Time: 03/21/23  1:34 AM   Specimen: BLOOD  Result Value Ref Range Status   Specimen Description   Final    BLOOD RIGHT ANTECUBITAL Performed at [facility], [address], [place], [place] [address]    Special Requests   Final    BOTTLES DRAWN AEROBIC AND ANAEROBIC Blood Culture results may not be optimal due to an excessive volume of blood received in culture bottles Performed at [facility], [address] [place], [place] [address]    Culture  Setup Time   Final    GRAM NEGATIVE RODS IN BOTH AEROBIC AND ANAEROBIC BOTTLES CRITICAL RESULT CALLED TO, READ BACK BY AND VERIFIED WITH: Ruthine Dose [id number], VIRAY,J CRITICAL VALUE NOTED.  VALUE IS CONSISTENT WITH PREVIOUSLY REPORTED AND CALLED VALUE.    Culture   Final    GRAM NEGATIVE  RODS IDENTIFICATION TO FOLLOW Performed at [facility], [address], [place], [place] [address]    Report Status PENDING  Incomplete    Studies/Results: No results found.  Assessment/Plan: POD#1 left ureteral stent placement  COntinue broad spectrum antibiotics pending blood cultures D/c foley   Wilkie Aye

## 2023-03-23 NOTE — Progress Notes (Signed)
[facility]                                                                               Marilyn Martin, is a 62 y.o. female, DOB - [birth year], [id number]:[id number] Admit date - 03/20/2023    Outpatient Primary MD for the patient is Marilyn Specking, MD  LOS - 2  days    Brief summary   Marilyn Martin is a 62 y.o. female with medical history significant of nephrolithiasis and anxiety who presents to the emergency department due to left flank pain which started on Saturday (10/12), pain improved on Sunday and she still continued to have some pain and chills without fever at home on Monday and Tuesday.  Symptoms worsened yesterday after work, she complained of nausea, vomiting, weakness with left lower abdominal pain and radiation to her groin was different from prior kidney stones, so she presented to the ED for further evaluation and management.   CT abdomen and pelvis with contrast showed 9 mm proximal left ureteral stone with moderate left hydronephrosis and perinephric stranding. Several other small nonobstructing left renal stones noted.  Urologist on-call (Dr. Eskridge) was consulted and recommended that patient can be stented here at  and that she can be admitted to the hospitalist service. Hospitalist was asked to admit patient for further evaluation and management.  Assessment & Plan    Assessment and Plan:  Severe Sepsis from obstructive stone causing UTI/ Ecoli bacteremia. Pt febrile, tachycardic, tachypnea, leukocytosis, hypotensive,  elevated lactic acid and UTI .  She was started on IV antibiotics and IV fluids.  Lactic acid normalized.  Leukocytosis on admission. Check wbc count in am.  Moderate leukocytosis on UA with pyuria, .  Blood cultures growing e coli bacteria. Follow up the sensitivities.     Hypotension  Resolved with fluid resuscitation.  Bp parameters are normal today.    Acute nephrolithiasis with obstructing stone, leading to left  mod hydronephrosis Urology consulted, and she underwent cystoscopy and JJ stent placement.  Pain control .  Renal parameters are wnl.   Persistent leukocytosis Monitor. From E coli bacteremia. Awaiting culture final report.    Estimated body mass index is 39.27 kg/m as calculated from the following:   Height as of this encounter: 5\' 5" (1.651 m).   Weight as of this encounter: 107 kg.  Code Status: full code.  DVT Prophylaxis:  SCDs Start: 03/21/23 0913 Place and maintain sequential compression device Start: 03/21/23 0742   Level of Care: Level of care: Med-Surg Family Communication: none at bedside.   Disposition Plan:     Remains inpatient appropriate:  IV antibiotics   Procedures:  cystoscopy  with left retrograde pyelography, and left JJ stent placement.   Consultants:   urology  Antimicrobials:   Anti-infectives (From admission, onward)    Start     Dose/Rate Route Frequency Ordered Stop   03/22/23 1200  cefTRIAXone (ROCEPHIN) 2 g in sodium chloride 0.9 % 100 mL IVPB        2 g 200 mL/hr over 30 Minutes Intravenous Every 24 hours 03/22/23 1056     10 /17/24 1000  piperacillin-tazobactam (ZOSYN) IVPB 3.375 g  Status:  Discontinued        3.375 g 12.5 mL/hr over 240 Minutes Intravenous Every 8 hours 03/21/23 0914 03/22/23 1056   03/21/23 0115  piperacillin-tazobactam (ZOSYN) IVPB 3.375 g        3.375 g 100 mL/hr over 30 Minutes Intravenous  Once 03/21/23 0108 03/21/23 0208        Medications  Scheduled Meds:   Continuous Infusions:  cefTRIAXone (ROCEPHIN)  IV 2 g (03/23/23 1145)   PRN Meds:.acetaminophen **OR** acetaminophen, fentaNYL (SUBLIMAZE) injection, melatonin, ondansetron **OR** ondansetron (ZOFRAN) IV    Subjective:   Marilyn Martin was seen and examined today.  Flank is better, but reports having pain whenever she has to urinate.  No chest pain or sob.  No nausea, vomiting or abdominal pain.   Objective:   Vitals:   03/22/23 2122  03/22/23 2136 03/23/23 0433 03/23/23 1443  BP: 97/70 104/78 (!) 104/47 117/72  Pulse: 83  76 86  Resp: 16  16 18   Temp: 98.2 F (36.8 C)  97.6 F (36.4 C) 98.4 F (36.9 C)  TempSrc: Oral  Oral Oral  SpO2: 93%  93% 95%  Weight:      Height:        Intake/Output Summary (Last 24 hours) at 03/23/2023 1448 Last data filed at 03/23/2023 1315 Gross per 24 hour  Intake 820 ml  Output --  Net 820 ml   Filed Weights   03/20/23 2035 03/21/23 0929  Weight: 107 kg 107 kg     Exam General exam: Appears calm and comfortable  Respiratory system: Clear to auscultation. Respiratory effort normal. Cardiovascular system: S1 & S2 heard, RRR.  Gastrointestinal system: Abdomen is nondistended, soft and nontender.  Central nervous system: Alert and oriented. No focal neurological deficits. Extremities: Symmetric 5 x 5 power. Skin: No rashes,  Psychiatry: Mood & affect appropriate.      Data Reviewed:  I have personally reviewed following labs and imaging studies   CBC Lab Results  Component Value Date   WBC 15.8 (H) 03/22/2023   RBC 4.46 03/22/2023   HGB 13.5 03/22/2023   HCT 40.9 03/22/2023   MCV 91.7 03/22/2023   MCH 30.3 03/22/2023   PLT 178 03/22/2023   MCHC 33.0 03/22/2023   RDW 12.9 03/22/2023   LYMPHSABS 0.8 05/15/2016   MONOABS 0.4 05/15/2016   EOSABS 0.0 05/15/2016   BASOSABS 0.0 05/15/2016     Last metabolic panel Lab Results  Component Value Date   NA 137 03/22/2023   K 3.7 03/22/2023   CL 102 03/22/2023   CO2 24 03/22/2023   BUN 20 03/22/2023   CREATININE 0.99 03/22/2023   GLUCOSE 119 (H) 03/22/2023   GFRNONAA >60 03/22/2023   GFRAA 85 02/22/2020   CALCIUM 8.8 (L) 03/22/2023   PHOS 3.7 03/22/2023   PROT 6.8 03/22/2023   ALBUMIN 3.4 (L) 03/22/2023   LABGLOB 2.5 01/11/2022   AGRATIO 1.8 01/11/2022   BILITOT 0.7 03/22/2023   ALKPHOS 54 03/22/2023   AST 17 03/22/2023   ALT 21 03/22/2023   ANIONGAP 11 03/22/2023    CBG (last 3)  No results for  input(s): "GLUCAP" in the last 72 hours.    Coagulation Profile: No results for input(s): "INR", "PROTIME" in the last 168 hours.   Radiology Studies: No results found.     Kathlen Mody M.D. [facility] 03/23/2023, 2:48 PM  Available via Epic secure chat 7am-7pm After 7 pm, please refer to night coverage provider listed on amion.

## 2023-03-24 DIAGNOSIS — N200 Calculus of kidney: Secondary | ICD-10-CM | POA: Diagnosis not present

## 2023-03-24 DIAGNOSIS — A419 Sepsis, unspecified organism: Secondary | ICD-10-CM | POA: Diagnosis not present

## 2023-03-24 DIAGNOSIS — N390 Urinary tract infection, site not specified: Secondary | ICD-10-CM | POA: Diagnosis not present

## 2023-03-24 LAB — BASIC METABOLIC PANEL
Anion gap: 9 (ref 5–15)
BUN: 13 mg/dL (ref 8–23)
CO2: 27 mmol/L (ref 22–32)
Calcium: 8.7 mg/dL — ABNORMAL LOW (ref 8.9–10.3)
Chloride: 102 mmol/L (ref 98–111)
Creatinine, Ser: 0.87 mg/dL (ref 0.44–1.00)
GFR, Estimated: >60 mL/min (ref >60)
Glucose, Bld: 100 mg/dL — ABNORMAL HIGH (ref 70–99)
Potassium: 3.3 mmol/L — ABNORMAL LOW (ref 3.5–5.1)
Sodium: 138 mmol/L (ref 135–145)

## 2023-03-24 LAB — CBC WITH DIFFERENTIAL/PLATELET
Abs Immature Granulocytes: 0.03 10*3/uL (ref 0.00–0.07)
Basophils Absolute: 0.1 10*3/uL (ref 0.0–0.1)
Basophils Relative: 1 %
Eosinophils Absolute: 0.1 10*3/uL (ref 0.0–0.5)
Eosinophils Relative: 1 %
HCT: 41.7 % (ref 36.0–46.0)
Hemoglobin: 13.8 g/dL (ref 12.0–15.0)
Immature Granulocytes: 0 %
Lymphocytes Relative: 28 %
Lymphs Abs: 2.2 10*3/uL (ref 0.7–4.0)
MCH: 30.5 pg (ref 26.0–34.0)
MCHC: 33.1 g/dL (ref 30.0–36.0)
MCV: 92.3 fL (ref 80.0–100.0)
Monocytes Absolute: 0.7 10*3/uL (ref 0.1–1.0)
Monocytes Relative: 9 %
Neutro Abs: 4.8 10*3/uL (ref 1.7–7.7)
Neutrophils Relative %: 61 %
Platelets: 213 10*3/uL (ref 150–400)
RBC: 4.52 MIL/uL (ref 3.87–5.11)
RDW: 12.9 % (ref 11.5–15.5)
WBC: 7.9 10*3/uL (ref 4.0–10.5)
nRBC: 0 % (ref 0.0–0.2)

## 2023-03-24 LAB — CULTURE, BLOOD (ROUTINE X 2)

## 2023-03-24 MED ORDER — AMOXICILLIN-POT CLAVULANATE 875-125 MG PO TABS: 1.0000 | ORAL_TABLET | Freq: Two times a day (BID) | ORAL | 0 refills | Status: AC

## 2023-03-24 NOTE — Anesthesia Postprocedure Evaluation (Signed)
Anesthesia Post Note  Patient: Marilyn Martin  Procedure(s) Performed: CYSTOSCOPY WITH RETROGRADE PYELOGRAM/URETERAL STENT PLACEMENT (Left: Ureter)  Patient location during evaluation: Phase II Anesthesia Type: General Level of consciousness: awake Pain management: pain level controlled Vital Signs Assessment: post-procedure vital signs reviewed and stable Respiratory status: spontaneous breathing and respiratory function stable Cardiovascular status: blood pressure returned to baseline and stable Postop Assessment: no headache and no apparent nausea or vomiting Anesthetic complications: no Comments: Late entry   No notable events documented.   Last Vitals:  Vitals:   03/23/23 2151 03/24/23 0525  BP: 119/69 108/68  Pulse: 88 82  Resp: 16 18  Temp: 37.2 C 37 C  SpO2: 96% 94%    Last Pain:  Vitals:   03/24/23 0525  TempSrc: Oral  PainSc:                  Windell Norfolk

## 2023-03-25 NOTE — Discharge Summary (Signed)
Physician Discharge Summary   Patient: Marilyn Martin MRN: [id number] DOB: [birth year]  Admit date:     03/20/2023  Discharge date: 03/24/2023  Discharge Physician: Kathlen Mody   PCP: Marilyn Specking, MD   Recommendations at discharge:  Please follow up with Urology as recommended.   Discharge Diagnoses: Principal Problem:   Sepsis due to urinary tract infection (HCC) Active Problems:   Kidney stone  Resolved Problems:   * No resolved hospital problems. *  Hospital Course:   Marilyn Martin is a 62 y.o. female with medical history significant of nephrolithiasis and anxiety who presents to the emergency department due to left flank pain which started on Saturday (10/12), pain improved on Sunday and she still continued to have some pain and chills without fever at home on Monday and Tuesday.  Symptoms worsened yesterday after work, she complained of nausea, vomiting, weakness with left lower abdominal pain and radiation to her groin was different from prior kidney stones, so she presented to the ED for further evaluation and management.   CT abdomen and pelvis with contrast showed 9 mm proximal left ureteral stone with moderate left hydronephrosis and perinephric stranding. Several other small nonobstructing left renal stones noted.   Urologist on-call (Dr. Eskridge) was consulted and recommended that patient can be stented here at [facility] and that she can be admitted to the hospitalist service. Hospitalist was asked to admit patient for further evaluation and management.   Assessment and Plan:     Severe Sepsis from obstructive stone causing UTI/ Ecoli bacteremia. Pt febrile, tachycardic, tachypnea, leukocytosis, hypotensive,  elevated lactic acid and UTI .  She was started on IV antibiotics and IV fluids.  Lactic acid normalized.  Leukocytosis on admission. Resolved.  Moderate leukocytosis on UA with pyuria, .  Blood cultures growing e coli bacteria. Sensitive to  augmentin.      Hypotension  Resolved with fluid resuscitation.  Bp parameters are normal today.      Acute nephrolithiasis with obstructing stone, leading to left mod hydronephrosis Urology consulted, and she underwent cystoscopy and JJ stent placement.  Pain control .  Renal parameters are wnl.    Persistent leukocytosis Resolved.      Estimated body mass index is 39.27 kg/m as calculated from the following:   Height as of this encounter: 5\' 5" (1.651 m).   Weight as of this encounter: 107 kg.     Consultants: urology.  Procedures performed: JJ stent placement.   Disposition: Home Diet recommendation:  Discharge Diet Orders (From admission, onward)     Start     Ordered   03/24/23 0000  Diet - low sodium heart healthy        10 /20/24 1231           Regular diet DISCHARGE MEDICATION: Allergies as of 03/24/2023       Reactions   Bee Venom Swelling   Macrobid [nitrofurantoin] Hives, Rash        Medication List     TAKE these medications    acetaminophen 500 MG tablet Commonly known as: TYLENOL Take 1,000 mg by mouth every 6 (six) hours as needed for headache.   albuterol 108 (90 Base) MCG/ACT inhaler Commonly known as: VENTOLIN HFA INHALE 1-2 puffs INTO THE LUNGS EVERY 6 HOURS AS NEEDED FOR WHEEZING OR SHORTNESS OF BREATH   amoxicillin-clavulanate 875-125 MG tablet Commonly known as: AUGMENTIN Take 1 tablet by mouth 2 (two) times daily for 7 days.   busPIRone 5  MG tablet Commonly known as: BUSPAR Take 1 tablet (5 mg total) by mouth 2 (two) times daily.   torsemide 10 MG tablet Commonly known as: DEMADEX Take 1 tablet (10 mg total) by mouth daily as needed. What changed:  how much to take when to take this   Vitamin D (Ergocalciferol) 1.25 MG (50000 UNIT) Caps capsule Commonly known as: DRISDOL Take 1 capsule (50,000 Units total) by mouth 2 (two) times a week.        Follow-up Information     Vyas, Dhruv B, MD. Schedule an  appointment as soon as possible for a visit in 1 week(s).   Specialty: Internal Medicine Contact information: [address] [place] [address] [phone]                Discharge Exam: Filed Weights   03/20/23 2035 03/21/23 0929  Weight: 107 kg 107 kg   General exam: Appears calm and comfortable  Respiratory system: Clear to auscultation. Respiratory effort normal. Cardiovascular system: S1 & S2 heard, RRR. No JVD,  Gastrointestinal system: Abdomen is nondistended, soft and nontender.  Central nervous system: Alert and oriented. No focal neurological deficits. Extremities: Symmetric 5 x 5 power. Skin: No rashes,  Psychiatry:  Mood & affect appropriate.    Condition at discharge: fair  The results of significant diagnostics from this hospitalization (including imaging, microbiology, ancillary and laboratory) are listed below for reference.   Imaging Studies: DG C-Arm 1-60 Min-No Report  Result Date: 03/21/2023 Fluoroscopy was utilized by the requesting physician.  No radiographic interpretation.   CT ABDOMEN PELVIS W CONTRAST  Result Date: 03/21/2023 CLINICAL DATA:  Left lower abdominal pain radiating to groin EXAM: CT ABDOMEN AND PELVIS WITH CONTRAST TECHNIQUE: Multidetector CT imaging of the abdomen and pelvis was performed using the standard protocol following bolus administration of intravenous contrast. RADIATION DOSE REDUCTION: This exam was performed according to the departmental dose-optimization program which includes automated exposure control, adjustment of the mA and/or kV according to patient size and/or use of iterative reconstruction technique. CONTRAST:  OMNIPAQUE IOHEXOL 300 MG/ML  SOLN COMPARISON:  06/22/2015 FINDINGS: Lower chest: Elevation of the right hemidiaphragm. Right base atelectasis. No effusions. Hepatobiliary: Prior cholecystectomy. Diffuse fatty infiltration of the liver. No focal hepatic abnormality. Pancreas: No focal abnormality or  ductal dilatation. Spleen: No focal abnormality.  Normal size. Adrenals/Urinary Tract: Moderate left hydronephrosis and perinephric stranding due to 9 mm proximal left ureteral stone. No stones or hydronephrosis on the right. Adrenal glands and urinary bladder unremarkable. Stomach/Bowel: Normal appendix. Prior gastric sleeve. Stomach, large and small bowel grossly unremarkable. Vascular/Lymphatic: No evidence of aneurysm or adenopathy. Reproductive: Uterus and adnexa unremarkable.  No mass. Other: No free fluid or free air. Musculoskeletal: No acute bony abnormality. IMPRESSION: 9 mm proximal left ureteral stone with moderate left hydronephrosis and perinephric stranding. Several other small nonobstructing left renal stones noted. Hepatic steatosis. Elevated right hemidiaphragm with right base atelectasis. Electronically Signed   By: Charlett Nose M.D.   On: 03/21/2023 02:08    Microbiology: Results for orders placed or performed during the hospital encounter of 03/20/23  Blood culture (routine x 2)     Status: Abnormal   Collection Time: 03/21/23  1:00 AM   Specimen: Left Antecubital; Blood  Result Value Ref Range Status   Specimen Description   Final    LEFT ANTECUBITAL Performed at [facility], [address], [place], [place] [address]    Special Requests   Final  BOTTLES DRAWN AEROBIC AND ANAEROBIC Performed at [facility], [address], [place], [place] [address]    Culture  Setup Time   Final    GRAM NEGATIVE RODS ANAEROBIC BOTTLE ONLY Gram Stain Report Called to,Read Back By and Verified With: Charleston Ropes RN 2009 [id number] K FORSYTH AEROBIC BOTTLE ALSO CRITICAL RESULT CALLED TO, READ BACK BY AND VERIFIED WITH: Charleston Ropes RN 03/22/2023  @ 0153 BY AB Performed at [facility], [address], [place], [place] [address]    Culture ESCHERICHIA COLI (A)  Final   Report Status 03/24/2023 FINAL  Final   Organism ID, Bacteria ESCHERICHIA COLI  Final      Susceptibility    Escherichia coli - MIC*    AMPICILLIN <=2 SENSITIVE Sensitive     CEFEPIME <=0.12 SENSITIVE Sensitive     CEFTAZIDIME <=1 SENSITIVE Sensitive     CEFTRIAXONE <=0.25 SENSITIVE Sensitive     CIPROFLOXACIN <=0.25 SENSITIVE Sensitive     GENTAMICIN <=1 SENSITIVE Sensitive     IMIPENEM <=0.25 SENSITIVE Sensitive     TRIMETH/SULFA <=20 SENSITIVE Sensitive     AMPICILLIN/SULBACTAM <=2 SENSITIVE Sensitive     PIP/TAZO <=4 SENSITIVE Sensitive ug/mL    * ESCHERICHIA COLI  Blood Culture ID Panel (Reflexed)     Status: Abnormal   Collection Time: 03/21/23  1:00 AM  Result Value Ref Range Status   Enterococcus faecalis NOT DETECTED NOT DETECTED Final   Enterococcus Faecium NOT DETECTED NOT DETECTED Final   Listeria monocytogenes NOT DETECTED NOT DETECTED Final   Staphylococcus species NOT DETECTED NOT DETECTED Final   Staphylococcus aureus (BCID) NOT DETECTED NOT DETECTED Final   Staphylococcus epidermidis NOT DETECTED NOT DETECTED Final   Staphylococcus lugdunensis NOT DETECTED NOT DETECTED Final   Streptococcus species NOT DETECTED NOT DETECTED Final   Streptococcus agalactiae NOT DETECTED NOT DETECTED Final   Streptococcus pneumoniae NOT DETECTED NOT DETECTED Final   Streptococcus pyogenes NOT DETECTED NOT DETECTED Final   A.calcoaceticus-baumannii NOT DETECTED NOT DETECTED Final   Bacteroides fragilis NOT DETECTED NOT DETECTED Final   Enterobacterales DETECTED (A) NOT DETECTED Final    Comment: Enterobacterales represent a large order of gram negative bacteria, not a single organism. CRITICAL RESULT CALLED TO, READ BACK BY AND VERIFIED WITH: M. REYNOLDS RN 03/22/2023  @ 0153 BY AB    Enterobacter cloacae complex NOT DETECTED NOT DETECTED Final   Escherichia coli DETECTED (A) NOT DETECTED Final    Comment: CRITICAL RESULT CALLED TO, READ BACK BY AND VERIFIED WITH: M. REYNOLDS RN 03/22/2023  @ 0153 BY AB    Klebsiella aerogenes NOT DETECTED NOT DETECTED Final   Klebsiella oxytoca NOT  DETECTED NOT DETECTED Final   Klebsiella pneumoniae NOT DETECTED NOT DETECTED Final   Proteus species NOT DETECTED NOT DETECTED Final   Salmonella species NOT DETECTED NOT DETECTED Final   Serratia marcescens NOT DETECTED NOT DETECTED Final   Haemophilus influenzae NOT DETECTED NOT DETECTED Final   Neisseria meningitidis NOT DETECTED NOT DETECTED Final   Pseudomonas aeruginosa NOT DETECTED NOT DETECTED Final   Stenotrophomonas maltophilia NOT DETECTED NOT DETECTED Final   Candida albicans NOT DETECTED NOT DETECTED Final   Candida auris NOT DETECTED NOT DETECTED Final   Candida glabrata NOT DETECTED NOT DETECTED Final   Candida krusei NOT DETECTED NOT DETECTED Final   Candida parapsilosis NOT DETECTED NOT DETECTED Final   Candida tropicalis NOT DETECTED NOT DETECTED Final   Cryptococcus neoformans/gattii NOT DETECTED NOT DETECTED Final   CTX-M  ESBL NOT DETECTED NOT DETECTED Final   Carbapenem resistance IMP NOT DETECTED NOT DETECTED Final   Carbapenem resistance KPC NOT DETECTED NOT DETECTED Final   Carbapenem resistance NDM NOT DETECTED NOT DETECTED Final   Carbapenem resist OXA 48 LIKE NOT DETECTED NOT DETECTED Final   Carbapenem resistance VIM NOT DETECTED NOT DETECTED Final    Comment: Performed at [facility], [address], [place], [place] [address]  Blood culture (routine x 2)     Status: Abnormal   Collection Time: 03/21/23  1:34 AM   Specimen: BLOOD  Result Value Ref Range Status   Specimen Description   Final    BLOOD RIGHT ANTECUBITAL Performed at [facility], [address] [place], [place] [address]    Special Requests   Final    BOTTLES DRAWN AEROBIC AND ANAEROBIC Blood Culture results may not be optimal due to an excessive volume of blood received in culture bottles Performed at [facility], [address] [place], [place] [address]    Culture  Setup Time   Final    GRAM NEGATIVE RODS IN BOTH AEROBIC AND ANAEROBIC BOTTLES CRITICAL RESULT CALLED TO, READ  BACK BY AND VERIFIED WITH: Ruthine Dose [id number], VIRAY,J CRITICAL VALUE NOTED.  VALUE IS CONSISTENT WITH PREVIOUSLY REPORTED AND CALLED VALUE.    Culture (A)  Final    ESCHERICHIA COLI SUSCEPTIBILITIES PERFORMED ON PREVIOUS CULTURE WITHIN THE LAST 5 DAYS. Performed at [facility], [address] [place], [place] [address]    Report Status 03/24/2023 FINAL  Final  Culture, blood (Routine X 2) w Reflex to ID Panel     Status: None (Preliminary result)   Collection Time: 03/24/23 11:26 AM   Specimen: Left Antecubital; Blood  Result Value Ref Range Status   Specimen Description   Final    LEFT ANTECUBITAL BOTTLES DRAWN AEROBIC AND ANAEROBIC   Special Requests   Final    Blood Culture results may not be optimal due to an excessive volume of blood received in culture bottles   Culture   Final    NO GROWTH < 24 HOURS Performed at [facility], [address] [place], [place] [address]    Report Status PENDING  Incomplete  Culture, blood (Routine X 2) w Reflex to ID Panel     Status: None (Preliminary result)   Collection Time: 03/24/23 11:26 AM   Specimen: Right Antecubital; Blood  Result Value Ref Range Status   Specimen Description   Final    RIGHT ANTECUBITAL BOTTLES DRAWN AEROBIC AND ANAEROBIC   Special Requests   Final    Blood Culture results may not be optimal due to an excessive volume of blood received in culture bottles   Culture   Final    NO GROWTH < 24 HOURS Performed at [facility], [address], [place], [place] [address]    Report Status PENDING  Incomplete    Labs: CBC: Recent Labs  Lab 03/20/23 2049 03/22/23 0504 03/23/23 1533 03/24/23 0406  WBC 12.6* 15.8* 9.9 7.9  NEUTROABS  --   --  7.1 4.8  HGB 15.8* 13.5 14.2 13.8  HCT 46.8* 40.9 43.0 41.7  MCV 90.9 91.7 91.9 92.3  PLT 229 178 226 213   Basic Metabolic Panel: Recent Labs  Lab 03/20/23 2049 03/22/23 0504 03/24/23 0406  NA 137 137 138  K 3.9 3.7 3.3*  CL 101 102 102  CO2 24 24  27   GLUCOSE 128* 119* 100*  BUN 16 20 13   CREATININE 1.00 0.99 0.87  CALCIUM 9.1 8.8* 8.7*  MG  --  2.2  --   PHOS  --  3.7  --    Liver Function Tests: Recent Labs  Lab 03/20/23 2049 03/22/23 0504  AST 17 17  ALT 20 21  ALKPHOS 66 54  BILITOT 0.8 0.7  PROT 7.8 6.8  ALBUMIN 4.2 3.4*   CBG: No results for input(s): "GLUCAP" in the last 168 hours.  Discharge time spent: 39 minutes  Signed: Kathlen Mody, MD [facility] 03/25/2023

## 2023-03-26 ENCOUNTER — Other Ambulatory Visit (HOSPITAL_COMMUNITY): Payer: Self-pay

## 2023-03-26 ENCOUNTER — Other Ambulatory Visit: Payer: Self-pay | Admitting: Urology

## 2023-03-26 DIAGNOSIS — N200 Calculus of kidney: Secondary | ICD-10-CM

## 2023-03-29 DIAGNOSIS — Z299 Encounter for prophylactic measures, unspecified: Secondary | ICD-10-CM | POA: Diagnosis not present

## 2023-03-29 DIAGNOSIS — E876 Hypokalemia: Secondary | ICD-10-CM | POA: Diagnosis not present

## 2023-03-29 DIAGNOSIS — N390 Urinary tract infection, site not specified: Secondary | ICD-10-CM | POA: Diagnosis not present

## 2023-03-29 DIAGNOSIS — Z23 Encounter for immunization: Secondary | ICD-10-CM | POA: Diagnosis not present

## 2023-03-29 DIAGNOSIS — Z9884 Bariatric surgery status: Secondary | ICD-10-CM | POA: Diagnosis not present

## 2023-03-29 DIAGNOSIS — Z6839 Body mass index (BMI) 39.0-39.9, adult: Secondary | ICD-10-CM | POA: Diagnosis not present

## 2023-03-29 LAB — CULTURE, BLOOD (ROUTINE X 2)
Culture: NO GROWTH
Culture: NO GROWTH

## 2023-04-01 ENCOUNTER — Telehealth: Payer: Self-pay | Admitting: Urology

## 2023-04-01 NOTE — Progress Notes (Deleted)
Name: Marilyn Martin DOB: Jan 27, 1961 MRN: [id number]  History of Present Illness: Ms. Marilyn Martin is a 62 y.o. female who presents today as a new patient / to re-establish care at [facility]. All available relevant medical records have been reviewed.  - GU History: 1. Kidney stones. - 06/23/2015: Underwent ESWL by Dr. Ronne Binning.  - 08/08/2015: Underwent left ureteroscopic stone manipulation by Dr. Ronne Binning.   Recent history: > Admitted 03/20/2023 - 03/24/2023 for severe sepsis from an obstructive 9 mm proximal left ureteral stone causing UTI/ Ecoli bacteremia. Underwent left ureteral stent placement by Dr. Ronne Binning on 03/21/2023. Per op note: "Patient is to be admitted for IV antibiotics. Her foley can be removed tomorrow morning. She will be scheduled for ureteral stone extraction in 2 weeks after completing course of culture specific antibiotics."  ***looks like URS surgery request already ordered per phone note by Kourtney on 10/28  Today: She {Actions; denies-reports:120008} flank pain or abdominal pain. She {Actions; denies-reports:120008} fevers, nausea, or vomiting.  She {Actions; denies-reports:120008} increased urinary urgency, frequency, nocturia, dysuria, gross hematuria, hesitancy, straining to void, or sensations of incomplete emptying.   Fall Screening: Do you usually have a device to assist in your mobility? {yes/no:20286} ***cane / ***walker / ***wheelchair  Medications: Current Outpatient Medications  Medication Sig Dispense Refill   acetaminophen (TYLENOL) 500 MG tablet Take 1,000 mg by mouth every 6 (six) hours as needed for headache.      albuterol (VENTOLIN HFA) 108 (90 Base) MCG/ACT inhaler INHALE 1-2 puffs INTO THE LUNGS EVERY 6 HOURS AS NEEDED FOR WHEEZING OR SHORTNESS OF BREATH 8.5 g 1   amoxicillin-clavulanate (AUGMENTIN) 875-125 MG tablet Take 1 tablet by mouth 2 (two) times daily for 7 days. 14 tablet 0   busPIRone (BUSPAR) 5 MG tablet  Take 1 tablet (5 mg total) by mouth 2 (two) times daily. 60 tablet 3   torsemide (DEMADEX) 10 MG tablet Take 1 tablet (10 mg total) by mouth daily as needed. (Patient taking differently: Take 20 mg by mouth daily.) 90 tablet 1   Vitamin D, Ergocalciferol, (DRISDOL) 1.25 MG (50000 UNIT) CAPS capsule Take 1 capsule (50,000 Units total) by mouth 2 (two) times a week. 25 capsule 4   No current facility-administered medications for this visit.    Allergies: Allergies  Allergen Reactions   Bee Venom Swelling   Macrobid [Nitrofurantoin] Hives and Rash    Past Medical History:  Diagnosis Date   Anxiety    Arthritis    osteo in knees and ankles    Constipation    Edema, lower extremity    Fatty liver    GERD (gastroesophageal reflux disease)    Headache    occ    High cholesterol    History of colon polyps    2013  hyperplastic   History of esophagitis    2013-- gerd   History of kidney stones    Hypertension    IBS (irritable bowel syndrome)    Joint pain    Left ureteral stone    Nephrolithiasis    left non-obstuctive per ct 06-22-2015   Osteoarthritis    Sleep apnea    cpap  auto starts at 2 may go up to 13    Vertigo    Vitamin D deficiency    Past Surgical History:  Procedure Laterality Date   CARDIOVASCULAR STRESS TEST  12-26-2012   normal perfusion study/  no ischemia or scar   CESAREAN SECTION  1987   w/  Bilateral Tubal Ligation   CHOLECYSTECTOMY N/A 07/31/2013   Procedure: LAPAROSCOPIC CHOLECYSTECTOMY;  Surgeon: Dalia Heading, MD;  Location: [facility];  Service: General;  Laterality: N/A;   COLONOSCOPY WITH ESOPHAGOGASTRODUODENOSCOPY (EGD)  04/25/2012   Procedure: COLONOSCOPY WITH ESOPHAGOGASTRODUODENOSCOPY (EGD);  Surgeon: Malissa Hippo, MD;  Location: [facility];  Service: Endoscopy;  Laterality: N/A;  730   CYSTOSCOPY W/ URETERAL STENT PLACEMENT Left 08/08/2015   Procedure: LEFT STENT REPLACEMENT;  Surgeon: Malen Gauze, MD;  [facility];  Service: Urology;  Laterality: Left;   CYSTOSCOPY W/ URETERAL STENT PLACEMENT Left 03/21/2023   Procedure: CYSTOSCOPY WITH RETROGRADE PYELOGRAM/URETERAL STENT PLACEMENT;  Surgeon: Malen Gauze, MD;  Location: [facility];  Service: Urology;  Laterality: Left;   CYSTOSCOPY WITH RETROGRADE PYELOGRAM, URETEROSCOPY AND STENT PLACEMENT Left 07/25/2015   Procedure: CYSTOSCOPY WITH LEFT RETROGRADE,;  Surgeon: Malen Gauze, MD;  Location: [facility];  Service: Urology;  Laterality: Left;   CYSTOSCOPY/RETROGRADE/URETEROSCOPY/STONE EXTRACTION WITH BASKET Left 08/08/2015   Procedure: CYSTOSCOPY LEFT RETROGRADE/URETEROSCOPY/STONE EXTRACTION WITH BASKET;  Surgeon: Malen Gauze, MD;  Location: [facility];  Service: Urology;  Laterality: Left;   DILATION AND CURETTAGE OF UTERUS  1986   HOLMIUM LASER APPLICATION Left 07/25/2015   Procedure: LEFT HOLMIUM LASER STONE EXTRACTION  AND LEFT STENT PLACEMENT;  Surgeon: Malen Gauze, MD;  Location: [facility];  Service: Urology;  Laterality: Left;   HOLMIUM LASER APPLICATION Left 08/08/2015   Procedure: HOLMIUM LASER APPLICATION;  Surgeon: Malen Gauze, MD;  Location: [facility];  Service: Urology;  Laterality: Left;   LAPAROSCOPIC GASTRIC SLEEVE RESECTION WITH HIATAL HERNIA REPAIR N/A 05/14/2016   Procedure: LAPAROSCOPIC GASTRIC SLEEVE RESECTION WITH HIATAL HERNIA REPAIR;  Surgeon: Luretha Murphy, MD;  Location: [facility];  Service: General;  Laterality: N/A;   LIVER BIOPSY N/A 07/31/2013   Procedure: LIVER BIOPSY;  Surgeon: Dalia Heading, MD;  Location: [facility];  Service: General;  Laterality: N/A;   MALONEY DILATION  04/25/2012   Procedure: Elease Hashimoto DILATION;  Surgeon: Malissa Hippo, MD;  [facility];  Service: Endoscopy;  Laterality: N/A;   TONSILLECTOMY  1967   TRANSTHORACIC ECHOCARDIOGRAM  12-26-2012   mild LVH,  ef 55-60%,  grade 1 diastolic dysfunction   TUBAL LIGATION     Family History   Problem Relation Age of Onset   Alzheimer's disease Mother    Thyroid disease Mother    Cancer Mother    Sudden death Mother    Stroke Mother    Stroke Father    Heart attack Father    Diabetes Father    High blood pressure Father    High Cholesterol Father    Heart disease Father    Alzheimer's disease Sister    Diabetes Brother    Colon cancer Paternal Uncle    Social History   Socioeconomic History   Marital status: Married    Spouse name: Not on file   Number of children: 2   Years of education: Not on file   Highest education level: Not on file  Occupational History   Occupation: Medical office staff    Employer: [employer]    Comment: Dr. Lionel December  Tobacco Use   Smoking status: Former    Current packs/day: 0.00    Average packs/day: 0.5 packs/day for 4.0 years (2.0 ttl pk-yrs)    Types: Cigarettes    Start date: 06/04/1977    Quit date: 06/04/1981  Years since quitting: 41.8   Smokeless tobacco: Never  Substance and Sexual Activity   Alcohol use: Yes    Comment: Rarely   Drug use: No   Sexual activity: Yes    Birth control/protection: Surgical  Other Topics Concern   Not on file  Social History Narrative   Not on file   Social Determinants of Health   Financial Resource Strain: Not on file  Food Insecurity: No Food Insecurity (03/21/2023)   Hunger Vital Sign    Worried About Running Out of Food in the Last Year: Never true    Ran Out of Food in the Last Year: Never true  Transportation Needs: No Transportation Needs (03/21/2023)   PRAPARE - Administrator, Civil Service (Medical): No    Lack of Transportation (Non-Medical): No  Physical Activity: Not on file  Stress: Not on file  Social Connections: Not on file  Intimate Partner Violence: Not At Risk (03/21/2023)   Humiliation, Afraid, Rape, and Kick questionnaire    Fear of Current or Ex-Partner: No    Emotionally Abused: No    Physically Abused: No    Sexually Abused: No     SUBJECTIVE  Review of Systems*** Constitutional: Patient denies any unintentional weight loss or change in strength lntegumentary: Patient denies any rashes or pruritus Eyes: Patient denies ***dry eyes ENT: Patient ***denies dry mouth Cardiovascular: Patient denies chest pain or syncope Respiratory: Patient denies shortness of breath Gastrointestinal: Patient ***denies nausea, vomiting, constipation, or diarrhea Musculoskeletal: Patient denies muscle cramps or weakness Neurologic: Patient denies convulsions or seizures Allergic/Immunologic: Patient denies recent allergic reaction(s) Hematologic/Lymphatic: Patient denies bleeding tendencies Endocrine: Patient denies heat/cold intolerance  GU: As per HPI.  OBJECTIVE There were no vitals filed for this visit. There is no height or weight on file to calculate BMI.  Physical Examination*** Constitutional: No obvious distress; patient is non-toxic appearing  Cardiovascular: No visible lower extremity edema.  Respiratory: The patient does not have audible wheezing/stridor; respirations do not appear labored  Gastrointestinal: Abdomen non-distended Musculoskeletal: Normal ROM of UEs  Skin: No obvious rashes/open sores  Neurologic: CN 2-12 grossly intact Psychiatric: Answered questions appropriately with normal affect  Hematologic/Lymphatic/Immunologic: No obvious bruises or sites of spontaneous bleeding  UA: ***negative *** WBC/hpf, *** RBC/hpf, bacteria (***) PVR: *** ml  ASSESSMENT No diagnosis found. ***  Will plan for follow up in *** months or sooner if needed. Pt verbalized understanding and agreement. All questions were answered.  PLAN Advised the following: *** ***No follow-ups on file.  No orders of the defined types were placed in this encounter.   It has been explained that the patient is to follow regularly with their PCP in addition to all other providers involved in their care and to follow instructions  provided by these respective offices. Patient advised to contact urology clinic if any urologic-pertaining questions, concerns, new symptoms or problems arise in the interim period.  There are no Patient Instructions on file for this visit.  Electronically signed by:  Donnita Falls, MSN, FNP-C, CUNP 04/01/2023 10:36 AM

## 2023-04-01 NOTE — Telephone Encounter (Signed)
I tried reaching patient regarding surgery date.  I left a message requesting a call back.  Updates in surgery work que.

## 2023-04-01 NOTE — Telephone Encounter (Signed)
Patient had stent placed on  the 17th and wants to know what her follow up is, or what she is suppose to be doing?  I set her an appointment with Sarah for Monday November 4,2024 , please advise.

## 2023-04-08 ENCOUNTER — Ambulatory Visit: Payer: 59 | Admitting: Urology

## 2023-04-08 DIAGNOSIS — Z960 Presence of urogenital implants: Secondary | ICD-10-CM

## 2023-04-08 DIAGNOSIS — R0981 Nasal congestion: Secondary | ICD-10-CM | POA: Diagnosis not present

## 2023-04-08 DIAGNOSIS — J069 Acute upper respiratory infection, unspecified: Secondary | ICD-10-CM | POA: Diagnosis not present

## 2023-04-08 DIAGNOSIS — A419 Sepsis, unspecified organism: Secondary | ICD-10-CM

## 2023-04-08 DIAGNOSIS — Z299 Encounter for prophylactic measures, unspecified: Secondary | ICD-10-CM | POA: Diagnosis not present

## 2023-04-08 DIAGNOSIS — N200 Calculus of kidney: Secondary | ICD-10-CM

## 2023-04-08 DIAGNOSIS — Z09 Encounter for follow-up examination after completed treatment for conditions other than malignant neoplasm: Secondary | ICD-10-CM

## 2023-04-11 ENCOUNTER — Other Ambulatory Visit: Payer: 59

## 2023-04-11 ENCOUNTER — Other Ambulatory Visit: Payer: Self-pay

## 2023-04-11 DIAGNOSIS — N200 Calculus of kidney: Secondary | ICD-10-CM | POA: Diagnosis not present

## 2023-04-11 LAB — MICROSCOPIC EXAMINATION: RBC, Urine: >30 /[HPF] — AB (ref 0–2)

## 2023-04-11 LAB — URINALYSIS, ROUTINE W REFLEX MICROSCOPIC
Bilirubin, UA: NEGATIVE
Glucose, UA: NEGATIVE
Ketones, UA: NEGATIVE
Nitrite, UA: POSITIVE — AB
Specific Gravity, UA: 1.02 (ref 1.005–1.030)
Urobilinogen, Ur: 0.2 mg/dL (ref 0.2–1.0)
pH, UA: 6.5 (ref 5.0–7.5)

## 2023-04-11 NOTE — Addendum Note (Signed)
Addended by: Grier Rocher on: 04/11/2023 10:42 AM   Modules accepted: Orders

## 2023-04-12 ENCOUNTER — Other Ambulatory Visit: Payer: Self-pay

## 2023-04-12 MED ORDER — SULFAMETHOXAZOLE-TRIMETHOPRIM 800-160 MG PO TABS: 1.0000 | ORAL_TABLET | Freq: Two times a day (BID) | ORAL | 0 refills | Status: DC

## 2023-04-12 NOTE — Telephone Encounter (Signed)
Patient confirmed pharmacy was [facility].  Abx sent.

## 2023-04-12 NOTE — Telephone Encounter (Signed)
"  please send for culture and please send bactrim DS BID for 7 days " order from Dr. Ronne Binning  My chart message sent to confirm pharmacy. Waiting for response.

## 2023-04-12 NOTE — Telephone Encounter (Signed)
-----   Message from Nurse Jill Side sent at 04/11/2023  5:38 PM EST ----- UA 1 week prior to stent exchange surgery 11/14

## 2023-04-14 LAB — URINE CULTURE

## 2023-04-15 ENCOUNTER — Encounter (HOSPITAL_COMMUNITY): Payer: Self-pay

## 2023-04-15 ENCOUNTER — Other Ambulatory Visit: Payer: Self-pay

## 2023-04-15 ENCOUNTER — Encounter (HOSPITAL_COMMUNITY)
Admission: RE | Admit: 2023-04-15 | Discharge: 2023-04-15 | Disposition: A | Discharge status: Home / Self Care | Payer: 59 | Source: Ambulatory Visit | Attending: Urology | Admitting: Urology

## 2023-04-15 NOTE — Pre-Procedure Instructions (Signed)
Attempted pre-op phonecall. Left VM for her to call us back. 

## 2023-04-18 ENCOUNTER — Encounter (HOSPITAL_COMMUNITY)
Admission: RE | Disposition: A | Discharge status: Home / Self Care | Payer: Self-pay | Source: Home / Self Care | Attending: Urology

## 2023-04-18 ENCOUNTER — Ambulatory Visit (HOSPITAL_COMMUNITY): Payer: 59

## 2023-04-18 ENCOUNTER — Ambulatory Visit (HOSPITAL_BASED_OUTPATIENT_CLINIC_OR_DEPARTMENT_OTHER): Payer: 59 | Admitting: Anesthesiology

## 2023-04-18 ENCOUNTER — Ambulatory Visit (HOSPITAL_COMMUNITY): Payer: 59 | Admitting: Anesthesiology

## 2023-04-18 ENCOUNTER — Ambulatory Visit (HOSPITAL_COMMUNITY)
Admission: RE | Admit: 2023-04-18 | Discharge: 2023-04-18 | Disposition: A | Discharge status: Home / Self Care | Payer: 59 | Attending: Urology | Admitting: Urology

## 2023-04-18 DIAGNOSIS — G4733 Obstructive sleep apnea (adult) (pediatric): Secondary | ICD-10-CM | POA: Diagnosis not present

## 2023-04-18 DIAGNOSIS — G4730 Sleep apnea, unspecified: Secondary | ICD-10-CM | POA: Insufficient documentation

## 2023-04-18 DIAGNOSIS — M1990 Unspecified osteoarthritis, unspecified site: Secondary | ICD-10-CM | POA: Diagnosis not present

## 2023-04-18 DIAGNOSIS — N132 Hydronephrosis with renal and ureteral calculous obstruction: Secondary | ICD-10-CM | POA: Diagnosis not present

## 2023-04-18 DIAGNOSIS — N201 Calculus of ureter: Secondary | ICD-10-CM | POA: Diagnosis not present

## 2023-04-18 DIAGNOSIS — Z87891 Personal history of nicotine dependence: Secondary | ICD-10-CM | POA: Diagnosis not present

## 2023-04-18 DIAGNOSIS — E66813 Obesity, class 3: Secondary | ICD-10-CM | POA: Diagnosis not present

## 2023-04-18 DIAGNOSIS — Z8249 Family history of ischemic heart disease and other diseases of the circulatory system: Secondary | ICD-10-CM | POA: Insufficient documentation

## 2023-04-18 DIAGNOSIS — I10 Essential (primary) hypertension: Secondary | ICD-10-CM | POA: Diagnosis not present

## 2023-04-18 HISTORY — PX: CYSTOSCOPY WITH RETROGRADE PYELOGRAM, URETEROSCOPY AND STENT PLACEMENT: SHX5789

## 2023-04-18 HISTORY — PX: STONE EXTRACTION WITH BASKET: SHX5318

## 2023-04-18 HISTORY — PX: HOLMIUM LASER APPLICATION: SHX5852

## 2023-04-18 SURGERY — CYSTOURETEROSCOPY, WITH RETROGRADE PYELOGRAM AND STENT INSERTION
Anesthesia: General | Site: Ureter | Laterality: Left

## 2023-04-18 MED ORDER — ROCURONIUM BROMIDE 10 MG/ML (PF) SYRINGE
PREFILLED_SYRINGE | INTRAVENOUS | Status: DC | PRN
  Administered 2023-04-18: 50 mg via INTRAVENOUS
  Administered 2023-04-18: 10 mg via INTRAVENOUS

## 2023-04-18 MED ORDER — HYDROCODONE-ACETAMINOPHEN 5-325 MG PO TABS: 1.0000 | ORAL_TABLET | Freq: Four times a day (QID) | ORAL | 0 refills | Status: DC | PRN

## 2023-04-18 MED ORDER — LIDOCAINE HCL (PF) 2 % IJ SOLN
INTRAMUSCULAR | Status: AC
  Filled 2023-04-18: qty 5

## 2023-04-18 MED ORDER — MIDAZOLAM HCL 2 MG/2ML IJ SOLN
INTRAMUSCULAR | Status: AC
  Filled 2023-04-18: qty 2

## 2023-04-18 MED ORDER — CHLORHEXIDINE GLUCONATE 0.12 % MT SOLN
15.0000 mL | Freq: Once | OROMUCOSAL | Status: AC
  Administered 2023-04-18: 15 mL via OROMUCOSAL

## 2023-04-18 MED ORDER — IPRATROPIUM-ALBUTEROL 0.5-2.5 (3) MG/3ML IN SOLN
3.0000 mL | RESPIRATORY_TRACT | Status: DC
  Administered 2023-04-18: 3 mL via RESPIRATORY_TRACT

## 2023-04-18 MED ORDER — SODIUM CHLORIDE 0.9 % IV SOLN: INTRAVENOUS | Status: DC | PRN

## 2023-04-18 MED ORDER — ORAL CARE MOUTH RINSE
15.0000 mL | Freq: Once | OROMUCOSAL | Status: AC
Start: 2023-04-18 — End: 2023-04-18

## 2023-04-18 MED ORDER — ONDANSETRON HCL 4 MG/2ML IJ SOLN
INTRAMUSCULAR | Status: DC | PRN
  Administered 2023-04-18: 4 mg via INTRAVENOUS

## 2023-04-18 MED ORDER — DIATRIZOATE MEGLUMINE 30 % UR SOLN
URETHRAL | Status: DC | PRN
  Administered 2023-04-18: 15 mL via URETHRAL

## 2023-04-18 MED ORDER — ONDANSETRON HCL 4 MG PO TABS: 4.0000 mg | ORAL_TABLET | Freq: Every day | ORAL | 1 refills | Status: DC | PRN

## 2023-04-18 MED ORDER — SODIUM CHLORIDE 0.9 % IV SOLN
2.0000 g | INTRAVENOUS | Status: AC
  Administered 2023-04-18: 2 g via INTRAVENOUS
  Filled 2023-04-18: qty 20

## 2023-04-18 MED ORDER — MIDAZOLAM HCL 2 MG/2ML IJ SOLN
INTRAMUSCULAR | Status: DC | PRN
  Administered 2023-04-18: 2 mg via INTRAVENOUS

## 2023-04-18 MED ORDER — LIDOCAINE HCL (PF) 2 % IJ SOLN
INTRAMUSCULAR | Status: DC | PRN
  Administered 2023-04-18: 50 mg via INTRADERMAL

## 2023-04-18 MED ORDER — FENTANYL CITRATE (PF) 250 MCG/5ML IJ SOLN
INTRAMUSCULAR | Status: DC | PRN
  Administered 2023-04-18 (×4): 50 ug via INTRAVENOUS

## 2023-04-18 MED ORDER — FENTANYL CITRATE (PF) 100 MCG/2ML IJ SOLN
INTRAMUSCULAR | Status: AC
  Filled 2023-04-18: qty 2

## 2023-04-18 MED ORDER — SUGAMMADEX SODIUM 200 MG/2ML IV SOLN
INTRAVENOUS | Status: DC | PRN
  Administered 2023-04-18: 200 mg via INTRAVENOUS

## 2023-04-18 MED ORDER — OXYCODONE HCL 5 MG PO TABS: 5.0000 mg | ORAL_TABLET | Freq: Once | ORAL | Status: DC | PRN

## 2023-04-18 MED ORDER — OXYCODONE HCL 5 MG/5ML PO SOLN: 5.0000 mg | Freq: Once | ORAL | Status: DC | PRN

## 2023-04-18 MED ORDER — PROPOFOL 10 MG/ML IV BOLUS
INTRAVENOUS | Status: DC | PRN
  Administered 2023-04-18: 170 mg via INTRAVENOUS

## 2023-04-18 MED ORDER — IPRATROPIUM-ALBUTEROL 0.5-2.5 (3) MG/3ML IN SOLN
RESPIRATORY_TRACT | Status: AC
  Filled 2023-04-18: qty 3

## 2023-04-18 MED ORDER — DIATRIZOATE MEGLUMINE 30 % UR SOLN
URETHRAL | Status: AC
  Filled 2023-04-18: qty 100

## 2023-04-18 MED ORDER — ALBUTEROL SULFATE HFA 108 (90 BASE) MCG/ACT IN AERS
INHALATION_SPRAY | RESPIRATORY_TRACT | Status: DC | PRN
  Administered 2023-04-18: 6 via RESPIRATORY_TRACT

## 2023-04-18 MED ORDER — SODIUM CHLORIDE 0.9 % IR SOLN
Status: DC | PRN
  Administered 2023-04-18: 3000 mL

## 2023-04-18 MED ORDER — ONDANSETRON HCL 4 MG/2ML IJ SOLN
INTRAMUSCULAR | Status: AC
  Filled 2023-04-18: qty 2

## 2023-04-18 MED ORDER — ONDANSETRON HCL 4 MG/2ML IJ SOLN: 4.0000 mg | Freq: Once | INTRAMUSCULAR | Status: DC | PRN

## 2023-04-18 MED ORDER — FENTANYL CITRATE PF 50 MCG/ML IJ SOSY: 25.0000 ug | PREFILLED_SYRINGE | INTRAMUSCULAR | Status: DC | PRN

## 2023-04-18 MED ORDER — ROCURONIUM BROMIDE 10 MG/ML (PF) SYRINGE
PREFILLED_SYRINGE | INTRAVENOUS | Status: AC
  Filled 2023-04-18: qty 10

## 2023-04-18 MED ORDER — DEXAMETHASONE SODIUM PHOSPHATE 4 MG/ML IJ SOLN
INTRAMUSCULAR | Status: DC | PRN
  Administered 2023-04-18: 5 mg via INTRAVENOUS

## 2023-04-18 MED ORDER — PROPOFOL 10 MG/ML IV BOLUS
INTRAVENOUS | Status: AC
  Filled 2023-04-18: qty 20

## 2023-04-18 MED ORDER — WATER FOR IRRIGATION, STERILE IR SOLN
Status: DC | PRN
  Administered 2023-04-18: 1000 mL

## 2023-04-18 SURGICAL SUPPLY — 27 items
BAG DRAIN URO TABLE W/ADPT NS (BAG) ×3 IMPLANT
BAG HAMPER (MISCELLANEOUS) ×3 IMPLANT
CATH INTERMIT  6FR 70CM (CATHETERS) ×3 IMPLANT
CLOTH BEACON ORANGE TIMEOUT ST (SAFETY) ×3 IMPLANT
DECANTER SPIKE VIAL GLASS SM (MISCELLANEOUS) ×3 IMPLANT
EXTRACTOR STONE NITINOL NGAGE (UROLOGICAL SUPPLIES) IMPLANT
GLOVE BIO SURGEON STRL SZ8 (GLOVE) ×3 IMPLANT
GLOVE BIOGEL PI IND STRL 7.0 (GLOVE) ×6 IMPLANT
GLOVE SS BIOGEL STRL SZ 8 (GLOVE) IMPLANT
GOWN STRL REUS W/TWL LRG LVL3 (GOWN DISPOSABLE) ×3 IMPLANT
GOWN STRL REUS W/TWL XL LVL3 (GOWN DISPOSABLE) ×3 IMPLANT
GUIDEWIRE STR DUAL SENSOR (WIRE) ×3 IMPLANT
GUIDEWIRE STR ZIPWIRE 035X150 (MISCELLANEOUS) ×3 IMPLANT
IV NS IRRIG 3000ML ARTHROMATIC (IV SOLUTION) ×6 IMPLANT
KIT TURNOVER CYSTO (KITS) ×3 IMPLANT
MANIFOLD NEPTUNE II (INSTRUMENTS) ×3 IMPLANT
PACK CYSTO (CUSTOM PROCEDURE TRAY) ×3 IMPLANT
PAD ARMBOARD 7.5X6 YLW CONV (MISCELLANEOUS) ×3 IMPLANT
POSITIONER HEAD 8X9X4 ADT (SOFTGOODS) ×3 IMPLANT
SHEATH ACCESS URETERAL 24CM (SHEATH) IMPLANT
STENT URET 6FRX26 CONTOUR (STENTS) IMPLANT
SYR 10ML LL (SYRINGE) ×3 IMPLANT
SYR CONTROL 10ML LL (SYRINGE) ×3 IMPLANT
TOWEL OR 17X26 4PK STRL BLUE (TOWEL DISPOSABLE) ×3 IMPLANT
TRACTIP FLEXIVA PULS ID 200XHI (Laser) IMPLANT
TRACTIP FLEXIVA PULSE ID 200 (Laser) ×2
WATER STERILE IRR 500ML POUR (IV SOLUTION) ×3 IMPLANT

## 2023-04-18 NOTE — Discharge Instructions (Signed)
Please remove tether on Sunday afternoon, Nov. 17.

## 2023-04-18 NOTE — Anesthesia Procedure Notes (Signed)
Procedure Name: Intubation Date/Time: 04/18/2023 11:42 AM  Performed by: Julian Reil, CRNAPre-anesthesia Checklist: Patient identified, Emergency Drugs available, Suction available and Patient being monitored Patient Re-evaluated:Patient Re-evaluated prior to induction Oxygen Delivery Method: Circle system utilized Preoxygenation: Pre-oxygenation with 100% oxygen Induction Type: IV induction Ventilation: Mask ventilation without difficulty Laryngoscope Size: Mac and 3 Grade View: Grade I Tube type: Oral Tube size: 7.5 mm Number of attempts: 1 Airway Equipment and Method: Stylet Placement Confirmation: ETT inserted through vocal cords under direct vision, positive ETCO2 and breath sounds checked- equal and bilateral Secured at: 22 cm Tube secured with: Tape Dental Injury: Teeth and Oropharynx as per pre-operative assessment

## 2023-04-18 NOTE — Transfer of Care (Signed)
Immediate Anesthesia Transfer of Care Note  Patient: MISHAYLA JACQUES  Procedure(s) Performed: CYSTOSCOPY WITH RETROGRADE PYELOGRAM, URETEROSCOPY AND STENT PLACEMENT- stent exchange (Left: Ureter) HOLMIUM LASER APPLICATION (Left: Ureter) STONE EXTRACTION WITH BASKET (Ureter)  Patient Location: PACU  Anesthesia Type:General  Level of Consciousness: awake, alert , and oriented  Airway & Oxygen Therapy: Patient Spontanous Breathing and Patient connected to face mask oxygen  Post-op Assessment: Report given to RN and Post -op Vital signs reviewed and stable  Post vital signs: Reviewed and stable  Last Vitals:  Vitals Value Taken Time  BP 146/71   Temp 97.5 F   Pulse 106 04/18/23 1306  Resp 25 04/18/23 1306  SpO2 96 % 04/18/23 1306  Vitals shown include unfiled device data.  Last Pain:  Vitals:   04/18/23 1025  TempSrc: Oral      Patients Stated Pain Goal: 5 (04/18/23 1025)  Complications: No notable events documented.

## 2023-04-18 NOTE — Op Note (Signed)
.  Preoperative diagnosis: Left ureteral stone  Postoperative diagnosis: Same  Procedure: 1 cystoscopy 2. Left retrograde pyelography 3.  Intraoperative fluoroscopy, under one hour, with interpretation 4.  Left ureteroscopic stone manipulation with laser lithotripsy 5.  Left 6 x 26 JJ stent placement  Attending: Cleda Mccreedy  Anesthesia: General  Estimated blood loss: None  Drains: Left 6 x 26 JJ ureteral stent with tether  Specimens: stone for analysis  Antibiotics: rocephin  Findings: left UPJ stone. No hydronephrosis. No masses/lesions in the bladder. Ureteral orifices in normal anatomic location.  Indications: Patient is a 62 year old female with a history of a left ureteral calculus who underwent stent placement 3 weeks ago for sepsis.  After discussing treatment options, she decided proceed with left ureteroscopic stone manipulation.  Procedure in detail: The patient was brought to the operating room and a brief timeout was done to ensure correct patient, correct procedure, correct site.  General anesthesia was administered patient was placed in dorsal lithotomy position.  Her genitalia was then prepped and draped in usual sterile fashion.  A rigid 22 French cystoscope was passed in the urethra and the bladder.  Bladder was inspected free masses or lesions.  the ureteral orifices were in the normal orthotopic locations.  a 6 french ureteral catheter was then instilled into the left ureteral orifice.  a gentle retrograde was obtained and findings noted above.  Using a grasper the left ureteral stent was brought to the urethral meatus. we then placed a zip wire through the ureteral stent and advanced up to the renal pelvis.  We then removed the stent. we then removed the cystoscope and cannulated the left ureteral orifice with a semirigid ureteroscope.  No stone was found in the ureter. Once we reached the UPJ a sensor wire was advanced in to the renal pelvis. We then removed the  ureteroscope and advanced am 12/14 x 35cm access sheath up to the renal pelvis. We then used the flexible ureteroscope to perform nephroscopy. We encountered the stone at the UPJ. Using a 242nm laser fiber the stone was fragmented and the fragments were then removed with a Ngage basket.    once all stone fragments were removed we then removed the access sheath under direct vision and noted no injury to the ureter. We then placed a 6 x 26 double-j ureteral stent over the original zip wire.  We then removed the wire and good coil was noted in the the renal pelvis under fluoroscopy and the bladder under direct vision. the bladder was then drained and this concluded the procedure which was well tolerated by patient.  Complications: None  Condition: Stable, extubated, transferred to PACU  Plan: Patient is to be discharged home as to follow-up in one week. She is to remove her stent in 72 hours by pulling the tether

## 2023-04-18 NOTE — Anesthesia Preprocedure Evaluation (Signed)
Anesthesia Evaluation  Patient identified by MRN, date of birth, ID band Patient awake    Reviewed: Allergy & Precautions, H&P , NPO status , Patient's Chart, lab work & pertinent test results, reviewed documented beta blocker date and time   Airway Mallampati: II  TM Distance: >3 FB Neck ROM: full    Dental no notable dental hx. (+) Dental Advisory Given, Teeth Intact   Pulmonary sleep apnea and Continuous Positive Airway Pressure Ventilation , former smoker   Pulmonary exam normal breath sounds clear to auscultation       Cardiovascular Exercise Tolerance: Good hypertension, Normal cardiovascular exam Rhythm:regular Rate:Normal     Neuro/Psych  Headaches  Anxiety      negative psych ROS   GI/Hepatic Neg liver ROS,GERD  ,,  Endo/Other    Class 3 obesity  Renal/GU Renal disease  negative genitourinary   Musculoskeletal  (+) Arthritis , Osteoarthritis,    Abdominal   Peds  Hematology negative hematology ROS (+)   Anesthesia Other Findings Hemidiaphragm paralysis  Reproductive/Obstetrics negative OB ROS                             Anesthesia Physical Anesthesia Plan  ASA: 3  Anesthesia Plan: General   Post-op Pain Management:    Induction: Intravenous  PONV Risk Score and Plan: Ondansetron, Dexamethasone and Midazolam  Airway Management Planned: LMA  Additional Equipment: None  Intra-op Plan:   Post-operative Plan:   Informed Consent: I have reviewed the patients History and Physical, chart, labs and discussed the procedure including the risks, benefits and alternatives for the proposed anesthesia with the patient or authorized representative who has indicated his/her understanding and acceptance.     Dental Advisory Given  Plan Discussed with: CRNA  Anesthesia Plan Comments:        Anesthesia Quick Evaluation

## 2023-04-18 NOTE — Interval H&P Note (Signed)
History and Physical Interval Note:  04/18/2023 11:07 AM  Marilyn Martin  has presented today for surgery, with the diagnosis of left ureteral stone.  The various methods of treatment have been discussed with the patient and family. After consideration of risks, benefits and other options for treatment, the patient has consented to  Procedure(s): CYSTOSCOPY WITH RETROGRADE PYELOGRAM, URETEROSCOPY AND STENT PLACEMENT- stent exchange (Left) HOLMIUM LASER APPLICATION (Left) as a surgical intervention.  The patient's history has been reviewed, patient examined, no change in status, stable for surgery.  I have reviewed the patient's chart and labs.  Questions were answered to the patient's satisfaction.     Wilkie Aye

## 2023-04-18 NOTE — Anesthesia Postprocedure Evaluation (Signed)
Anesthesia Post Note  Patient: Marilyn Martin  Procedure(s) Performed: CYSTOSCOPY WITH RETROGRADE PYELOGRAM, URETEROSCOPY AND STENT PLACEMENT- stent exchange (Left: Ureter) HOLMIUM LASER APPLICATION (Left: Ureter) STONE EXTRACTION WITH BASKET (Ureter)  Patient location during evaluation: PACU Anesthesia Type: General Level of consciousness: awake and alert Pain management: pain level controlled Vital Signs Assessment: post-procedure vital signs reviewed and stable Respiratory status: spontaneous breathing, nonlabored ventilation, respiratory function stable and patient connected to nasal cannula oxygen Cardiovascular status: blood pressure returned to baseline and stable Postop Assessment: no apparent nausea or vomiting Anesthetic complications: no   There were no known notable events for this encounter.   Last Vitals:  Vitals:   04/18/23 1330 04/18/23 1335  BP: (!) 148/74   Pulse: 99 98  Resp: (!) 23 (!) 27  Temp:    SpO2: 93% 92%    Last Pain:  Vitals:   04/18/23 1327  TempSrc:   PainSc: 0-No pain                 [name] [name]

## 2023-04-19 ENCOUNTER — Telehealth: Payer: Self-pay

## 2023-04-19 NOTE — Telephone Encounter (Signed)
Patient states she thinks her stent is coming out and she is having incontinence.  Verbal from Dr. Ronne Binning to have patient pull the stent.  Patient pulled the stent and instructed to call us if she has any issues, patient voiced understanding and will follow up as scheduled.

## 2023-04-23 ENCOUNTER — Encounter (HOSPITAL_COMMUNITY): Payer: Self-pay | Admitting: Urology

## 2023-04-23 ENCOUNTER — Other Ambulatory Visit (HOSPITAL_COMMUNITY): Payer: Self-pay

## 2023-04-24 ENCOUNTER — Ambulatory Visit: Payer: 59 | Admitting: Urology

## 2023-04-24 ENCOUNTER — Encounter: Payer: Self-pay | Admitting: *Deleted

## 2023-04-24 VITALS — BP 114/76 | HR 65

## 2023-04-24 DIAGNOSIS — Z09 Encounter for follow-up examination after completed treatment for conditions other than malignant neoplasm: Secondary | ICD-10-CM | POA: Diagnosis not present

## 2023-04-24 DIAGNOSIS — Z87442 Personal history of urinary calculi: Secondary | ICD-10-CM

## 2023-04-24 DIAGNOSIS — N200 Calculus of kidney: Secondary | ICD-10-CM

## 2023-04-24 LAB — URINALYSIS, ROUTINE W REFLEX MICROSCOPIC
Bilirubin, UA: NEGATIVE
Glucose, UA: NEGATIVE
Ketones, UA: NEGATIVE
Leukocytes,UA: NEGATIVE
Nitrite, UA: NEGATIVE
Protein,UA: NEGATIVE
RBC, UA: NEGATIVE
Specific Gravity, UA: 1.02 (ref 1.005–1.030)
Urobilinogen, Ur: 0.2 mg/dL (ref 0.2–1.0)
pH, UA: 6 (ref 5.0–7.5)

## 2023-04-24 LAB — CALCULI, WITH PHOTOGRAPH (CLINICAL LAB)
Calcium Oxalate Dihydrate: 10 %
Calcium Oxalate Monohydrate: 40 %
Hydroxyapatite: 50 %
Weight Calculi: 110 mg

## 2023-04-24 NOTE — Progress Notes (Signed)
04/24/2023 8:36 AM   Gray Bernhardt Jacqualin Combes 05-18-1961 [id number]  Referring provider: Ignatius Specking, MD [address] [place],  [place] [address]  Followup nephrolithiasis   HPI: Ms Taussig is a 61yo here for followup for nephrolithiasis. She removed her stent POD#1 for a dislodged stent. She passed a couple of small fragments. UA normal. No significant LUTS. She has mild flank pain.    PMH: Past Medical History:  Diagnosis Date   Anxiety    Arthritis    osteo in knees and ankles    Constipation    Edema, lower extremity    Fatty liver    GERD (gastroesophageal reflux disease)    Headache    occ    High cholesterol    History of colon polyps    2013  hyperplastic   History of esophagitis    2013-- gerd   History of kidney stones    Hypertension    IBS (irritable bowel syndrome)    Joint pain    Left ureteral stone    Nephrolithiasis    left non-obstuctive per ct 06-22-2015   Osteoarthritis    Sleep apnea    cpap  auto starts at 2 may go up to 13    Vertigo    Vitamin D deficiency     Surgical History: Past Surgical History:  Procedure Laterality Date   CARDIOVASCULAR STRESS TEST  12-26-2012   normal perfusion study/  no ischemia or scar   CESAREAN SECTION  1987   w/  Bilateral Tubal Ligation   CHOLECYSTECTOMY N/A 07/31/2013   Procedure: LAPAROSCOPIC CHOLECYSTECTOMY;  Surgeon: Dalia Heading, MD;  Location: [facility];  Service: General;  Laterality: N/A;   COLONOSCOPY WITH ESOPHAGOGASTRODUODENOSCOPY (EGD)  04/25/2012   Procedure: COLONOSCOPY WITH ESOPHAGOGASTRODUODENOSCOPY (EGD);  Surgeon: Malissa Hippo, MD;  Location: [facility];  Service: Endoscopy;  Laterality: N/A;  730   CYSTOSCOPY W/ URETERAL STENT PLACEMENT Left 08/08/2015   Procedure: LEFT STENT REPLACEMENT;  Surgeon: Malen Gauze, MD;  Location: [facility];  Service: Urology;  Laterality: Left;   CYSTOSCOPY W/ URETERAL STENT PLACEMENT Left 03/21/2023   Procedure: CYSTOSCOPY WITH  RETROGRADE PYELOGRAM/URETERAL STENT PLACEMENT;  Surgeon: Malen Gauze, MD;  Location: [facility];  Service: Urology;  Laterality: Left;   CYSTOSCOPY WITH RETROGRADE PYELOGRAM, URETEROSCOPY AND STENT PLACEMENT Left 07/25/2015   Procedure: CYSTOSCOPY WITH LEFT RETROGRADE,;  Surgeon: Malen Gauze, MD;  Location: [facility];  Service: Urology;  Laterality: Left;   CYSTOSCOPY WITH RETROGRADE PYELOGRAM, URETEROSCOPY AND STENT PLACEMENT Left 04/18/2023   Procedure: CYSTOSCOPY WITH RETROGRADE PYELOGRAM, URETEROSCOPY AND STENT PLACEMENT- stent exchange;  Surgeon: Malen Gauze, MD;  Location: [facility];  Service: Urology;  Laterality: Left;   CYSTOSCOPY/RETROGRADE/URETEROSCOPY/STONE EXTRACTION WITH BASKET Left 08/08/2015   Procedure: CYSTOSCOPY LEFT RETROGRADE/URETEROSCOPY/STONE EXTRACTION WITH BASKET;  Surgeon: Malen Gauze, MD;  Location[facility];  Service: Urology;  Laterality: Left;   DILATION AND CURETTAGE OF UTERUS  1986   HOLMIUM LASER APPLICATION Left 07/25/2015   Procedure: LEFT HOLMIUM LASER STONE EXTRACTION  AND LEFT STENT PLACEMENT;  Surgeon: Malen Gauze, MD;  Location: [facility];  Service: Urology;  Laterality: Left;   HOLMIUM LASER APPLICATION Left 08/08/2015   Procedure: HOLMIUM LASER APPLICATION;  Surgeon: Malen Gauze, MD;  Location: [facility];  Service: Urology;  Laterality: Left;   HOLMIUM LASER APPLICATION Left 04/18/2023   Procedure: HOLMIUM LASER APPLICATION;  Surgeon: Malen Gauze, MD;  Location: [facility];  Service: Urology;  Laterality: Left;   LAPAROSCOPIC GASTRIC SLEEVE RESECTION WITH HIATAL HERNIA REPAIR N/A 05/14/2016   Procedure: LAPAROSCOPIC GASTRIC SLEEVE RESECTION WITH HIATAL HERNIA REPAIR;  Surgeon: Luretha Murphy, MD;  Location: [facility];  Service: General;  Laterality: N/A;   LIVER BIOPSY N/A 07/31/2013   Procedure: LIVER BIOPSY;  Surgeon: Dalia Heading, MD;  Location: [facility];  Service: General;  Laterality: N/A;    MALONEY DILATION  04/25/2012   Procedure: Elease Hashimoto DILATION;  Surgeon: Malissa Hippo, MD;  [facility];  Service: Endoscopy;  Laterality: N/A;   STONE EXTRACTION WITH BASKET  04/18/2023   Procedure: STONE EXTRACTION WITH BASKET;  Surgeon: Malen Gauze, MD;  Location: [facility];  Service: Urology;;   TONSILLECTOMY  1967   TRANSTHORACIC ECHOCARDIOGRAM  12-26-2012   mild LVH,  ef 55-60%,  grade 1 diastolic dysfunction   TUBAL LIGATION      Home Medications:  Allergies as of 04/24/2023       Reactions   Bee Venom Swelling   Macrobid [nitrofurantoin] Hives, Rash        Medication List        Accurate as of April 24, 2023  8:36 AM. If you have any questions, ask your nurse or doctor.          STOP taking these medications    HYDROcodone-acetaminophen 5-325 MG tablet Commonly known as: Norco   ondansetron 4 MG tablet Commonly known as: Zofran   sulfamethoxazole-trimethoprim 800-160 MG tablet Commonly known as: BACTRIM DS       TAKE these medications    acetaminophen 500 MG tablet Commonly known as: TYLENOL Take 1,000 mg by mouth every 6 (six) hours as needed for headache.   albuterol 108 (90 Base) MCG/ACT inhaler Commonly known as: VENTOLIN HFA INHALE 1-2 puffs INTO THE LUNGS EVERY 6 HOURS AS NEEDED FOR WHEEZING OR SHORTNESS OF BREATH   busPIRone 5 MG tablet Commonly known as: BUSPAR Take 1 tablet (5 mg total) by mouth 2 (two) times daily.   torsemide 10 MG tablet Commonly known as: DEMADEX Take 1 tablet (10 mg total) by mouth daily as needed. What changed:  how much to take when to take this   Vitamin D (Ergocalciferol) 1.25 MG (50000 UNIT) Caps capsule Commonly known as: DRISDOL Take 1 capsule (50,000 Units total) by mouth 2 (two) times a week.        Allergies:  Allergies  Allergen Reactions   Bee Venom Swelling   Macrobid [Nitrofurantoin] Hives and Rash    Family History: Family History  Problem Relation Age of Onset    Alzheimer's disease Mother    Thyroid disease Mother    Cancer Mother    Sudden death Mother    Stroke Mother    Stroke Father    Heart attack Father    Diabetes Father    High blood pressure Father    High Cholesterol Father    Heart disease Father    Alzheimer's disease Sister    Diabetes Brother    Colon cancer Paternal Uncle     Social History:  reports that she quit smoking about 41 years ago. Her smoking use included cigarettes. She started smoking about 45 years ago. She has a 2 pack-year smoking history. She has never used smokeless tobacco. She reports current alcohol use. She reports that she does not use drugs.  ROS: All other review of systems were reviewed and are negative except what  is noted above in HPI  Physical Exam: BP 114/76   Pulse 65   Constitutional:  Alert and oriented, No acute distress. HEENT: [place] AT, moist mucus membranes.  Trachea midline, no masses. Cardiovascular: No clubbing, cyanosis, or edema. Respiratory: Normal respiratory effort, no increased work of breathing. GI: Abdomen is soft, nontender, nondistended, no abdominal masses GU: No CVA tenderness.  Lymph: No cervical or inguinal lymphadenopathy. Skin: No rashes, bruises or suspicious lesions. Neurologic: Grossly intact, no focal deficits, moving all 4 extremities. Psychiatric: Normal mood and affect.  Laboratory Data: Lab Results  Component Value Date   WBC 7.9 03/24/2023   HGB 13.8 03/24/2023   HCT 41.7 03/24/2023   MCV 92.3 03/24/2023   PLT 213 03/24/2023    Lab Results  Component Value Date   CREATININE 0.87 03/24/2023    No results found for: "PSA"  No results found for: "TESTOSTERONE"  Lab Results  Component Value Date   HGBA1C 5.5 01/11/2022    Urinalysis    Component Value Date/Time   COLORURINE YELLOW 03/20/2023 0013   APPEARANCEUR Clear 04/11/2023 1100   LABSPEC 1.018 03/20/2023 0013   PHURINE 5.0 03/20/2023 0013   GLUCOSEU Negative 04/11/2023 1100    HGBUR MODERATE (A) 03/20/2023 0013   BILIRUBINUR Negative 04/11/2023 1100   KETONESUR NEGATIVE 03/20/2023 0013   PROTEINUR 2+ (A) 04/11/2023 1100   PROTEINUR 100 (A) 03/20/2023 0013   NITRITE Positive (A) 04/11/2023 1100   NITRITE NEGATIVE 03/20/2023 0013   LEUKOCYTESUR 2+ (A) 04/11/2023 1100   LEUKOCYTESUR MODERATE (A) 03/20/2023 0013    Lab Results  Component Value Date   LABMICR See below: 04/11/2023   WBCUA 11-30 (A) 04/11/2023   LABEPIT 0-10 04/11/2023   BACTERIA Moderate (A) 04/11/2023    Pertinent Imaging:  Results for orders placed during the hospital encounter of 07/23/15  DG Abd 1 View  Narrative CLINICAL DATA:  Left-sided abdominal pain 2-3 days. Known left ureteral stone.  EXAM: ABDOMEN - 1 VIEW  COMPARISON:  07/12/2015  FINDINGS: Bowel gas pattern is nonobstructive. There is been fragmentation of patient's large proximal left ureteral stone with moderate residual fragments over the proximal ureter. Suggestion of small residual fragments over the distal left ureter. Small stones over the lower pole left kidney. Minimal degenerate change of the spine and hips.  IMPRESSION: Left nephrolithiasis. Interval fragmentation of patient's large proximal left ureteral stone with several prominent residual fragments over the approximate ureter as well small fragments over the distal ureter.  Nonobstructive bowel gas pattern.   Electronically Signed By: Elberta Fortis M.D. On: 07/23/2015 21:12  No results found for this or any previous visit.  No results found for this or any previous visit.  No results found for this or any previous visit.  Results for orders placed during the hospital encounter of 07/02/17  US RENAL  Narrative CLINICAL DATA:  Follow-up nephrolithiasis  EXAM: RENAL / URINARY TRACT ULTRASOUND COMPLETE  COMPARISON:  Renal ultrasound dated 07/09/2016. CT abdomen/pelvis dated 06/22/2015.  FINDINGS: Right Kidney:  Length: 11.0 cm.   No mass or hydronephrosis.  Left Kidney:  Length: 11.1 cm.  No mass or hydronephrosis.  Bladder:  Within normal limits.  IMPRESSION: Negative renal ultrasound.   Electronically Signed By: Charline Bills M.D. On: 07/03/2017 08:26  No valid procedures specified. No results found for this or any previous visit.  Results for orders placed during the hospital encounter of 06/22/15  CT RENAL STONE STUDY  Narrative CLINICAL DATA:  RIGHT side back  pain with pain down RIGHT leg since 05/29/2015, history kidney stones, abnormal radiograph  EXAM: CT ABDOMEN AND PELVIS WITHOUT CONTRAST  TECHNIQUE: Multidetector CT imaging of the abdomen and pelvis was performed following the standard protocol without IV contrast. Sagittal and coronal MPR images reconstructed from axial data set. Oral contrast not administered for this indication.  COMPARISON:  01/25/2004; correlation lumbar spine radiographs 06/16/2015  FINDINGS: Lung bases clear.  14 x 13 x 21 mm diameter calculus at LEFT ureteropelvic junction with moderate LEFT hydronephrosis.  Additional tiny nonobstructing LEFT renal calculi.  Ureters and bladder normal appearance.  Within limits of a nonenhanced exam no focal abnormalities of the liver, spleen, pancreas, RIGHT kidney, or adrenal glands.  Gallbladder surgically absent.  Normal appendix, uterus, and adnexa.  Stomach and bowel loops normal appearance.  Umbilical hernia containing fat.  No mass, adenopathy, free air, free fluid, or inflammatory process.  Bones unremarkable.  No definite lumbar disc herniation or neural compression.  IMPRESSION: Large 14 x 13 x 21 mm LEFT UPJ calculus with moderate LEFT hydronephrosis.  Additional tiny nonobstructing LEFT renal calculi.  Umbilical hernia containing fat.   Electronically Signed By: Ulyses Southward M.D. On: 06/22/2015 16:41   Assessment & Plan:    1. Nephrolithiasis Followup 4-6 weeks with renal  US -BMP, PTH and uric acid - Urinalysis, Routine w reflex microscopic   No follow-ups on file.  Wilkie Aye, MD  [facility] [place]

## 2023-04-25 LAB — URIC ACID: Uric Acid: 5.9 mg/dL (ref 3.0–7.2)

## 2023-04-25 LAB — BASIC METABOLIC PANEL
BUN/Creatinine Ratio: 18 (ref 12–28)
BUN: 15 mg/dL (ref 8–27)
CO2: 24 mmol/L (ref 20–29)
Calcium: 10.2 mg/dL (ref 8.7–10.3)
Chloride: 104 mmol/L (ref 96–106)
Creatinine, Ser: 0.85 mg/dL (ref 0.57–1.00)
Glucose: 96 mg/dL (ref 70–99)
Potassium: 4.6 mmol/L (ref 3.5–5.2)
Sodium: 143 mmol/L (ref 134–144)
eGFR: 77 mL/min/{1.73_m2} (ref >59)

## 2023-04-25 LAB — PTH, INTACT AND CALCIUM: PTH: 48 pg/mL (ref 15–65)

## 2023-04-30 ENCOUNTER — Encounter: Payer: Self-pay | Admitting: Urology

## 2023-04-30 NOTE — Patient Instructions (Signed)

## 2023-05-01 ENCOUNTER — Encounter (HOSPITAL_COMMUNITY): Payer: Self-pay

## 2023-05-01 ENCOUNTER — Other Ambulatory Visit: Payer: Self-pay

## 2023-05-01 ENCOUNTER — Other Ambulatory Visit (HOSPITAL_COMMUNITY): Payer: 59

## 2023-05-01 ENCOUNTER — Encounter (HOSPITAL_COMMUNITY)
Admission: RE | Admit: 2023-05-01 | Discharge: 2023-05-01 | Disposition: A | Discharge status: Home / Self Care | Payer: 59 | Source: Ambulatory Visit | Attending: Gastroenterology | Admitting: Gastroenterology

## 2023-05-06 DIAGNOSIS — Z13820 Encounter for screening for osteoporosis: Secondary | ICD-10-CM | POA: Diagnosis not present

## 2023-05-07 ENCOUNTER — Encounter (HOSPITAL_COMMUNITY): Payer: Self-pay | Admitting: Gastroenterology

## 2023-05-07 ENCOUNTER — Ambulatory Visit (HOSPITAL_BASED_OUTPATIENT_CLINIC_OR_DEPARTMENT_OTHER): Payer: 59 | Admitting: Anesthesiology

## 2023-05-07 ENCOUNTER — Other Ambulatory Visit: Payer: Self-pay

## 2023-05-07 ENCOUNTER — Encounter (INDEPENDENT_AMBULATORY_CARE_PROVIDER_SITE_OTHER): Payer: Self-pay | Admitting: *Deleted

## 2023-05-07 ENCOUNTER — Ambulatory Visit (HOSPITAL_COMMUNITY): Payer: 59 | Admitting: Anesthesiology

## 2023-05-07 ENCOUNTER — Ambulatory Visit (HOSPITAL_COMMUNITY)
Admission: RE | Admit: 2023-05-07 | Discharge: 2023-05-07 | Disposition: A | Discharge status: Home / Self Care | Payer: 59 | Attending: Gastroenterology | Admitting: Gastroenterology

## 2023-05-07 ENCOUNTER — Encounter (HOSPITAL_COMMUNITY)
Admission: RE | Disposition: A | Discharge status: Home / Self Care | Payer: Self-pay | Source: Home / Self Care | Attending: Gastroenterology

## 2023-05-07 DIAGNOSIS — K5730 Diverticulosis of large intestine without perforation or abscess without bleeding: Secondary | ICD-10-CM

## 2023-05-07 DIAGNOSIS — Z87891 Personal history of nicotine dependence: Secondary | ICD-10-CM | POA: Diagnosis not present

## 2023-05-07 DIAGNOSIS — K648 Other hemorrhoids: Secondary | ICD-10-CM | POA: Diagnosis not present

## 2023-05-07 DIAGNOSIS — Z860102 Personal history of hyperplastic colon polyps: Secondary | ICD-10-CM | POA: Diagnosis not present

## 2023-05-07 DIAGNOSIS — I10 Essential (primary) hypertension: Secondary | ICD-10-CM | POA: Diagnosis not present

## 2023-05-07 DIAGNOSIS — F419 Anxiety disorder, unspecified: Secondary | ICD-10-CM | POA: Diagnosis not present

## 2023-05-07 DIAGNOSIS — K760 Fatty (change of) liver, not elsewhere classified: Secondary | ICD-10-CM | POA: Diagnosis not present

## 2023-05-07 DIAGNOSIS — J986 Disorders of diaphragm: Secondary | ICD-10-CM | POA: Diagnosis not present

## 2023-05-07 DIAGNOSIS — Z6838 Body mass index (BMI) 38.0-38.9, adult: Secondary | ICD-10-CM | POA: Insufficient documentation

## 2023-05-07 DIAGNOSIS — G4730 Sleep apnea, unspecified: Secondary | ICD-10-CM | POA: Diagnosis not present

## 2023-05-07 DIAGNOSIS — Z1211 Encounter for screening for malignant neoplasm of colon: Secondary | ICD-10-CM | POA: Insufficient documentation

## 2023-05-07 HISTORY — PX: COLONOSCOPY WITH PROPOFOL: SHX5780

## 2023-05-07 LAB — HM COLONOSCOPY

## 2023-05-07 SURGERY — COLONOSCOPY WITH PROPOFOL
Anesthesia: General

## 2023-05-07 MED ORDER — PHENYLEPHRINE 80 MCG/ML (10ML) SYRINGE FOR IV PUSH (FOR BLOOD PRESSURE SUPPORT)
PREFILLED_SYRINGE | INTRAVENOUS | Status: DC | PRN
  Administered 2023-05-07: 160 ug via INTRAVENOUS

## 2023-05-07 MED ORDER — PHENYLEPHRINE 80 MCG/ML (10ML) SYRINGE FOR IV PUSH (FOR BLOOD PRESSURE SUPPORT)
PREFILLED_SYRINGE | INTRAVENOUS | Status: AC
  Filled 2023-05-07: qty 10

## 2023-05-07 MED ORDER — PROPOFOL 10 MG/ML IV BOLUS
INTRAVENOUS | Status: DC | PRN
  Administered 2023-05-07: 100 mg via INTRAVENOUS

## 2023-05-07 MED ORDER — STERILE WATER FOR IRRIGATION IR SOLN
Status: DC | PRN
  Administered 2023-05-07 (×2): 60 mL

## 2023-05-07 MED ORDER — PROPOFOL 500 MG/50ML IV EMUL
INTRAVENOUS | Status: DC | PRN
Start: 2023-05-07 — End: 2023-05-07
  Administered 2023-05-07: 150 ug/kg/min via INTRAVENOUS

## 2023-05-07 MED ORDER — LIDOCAINE HCL (PF) 2 % IJ SOLN
INTRAMUSCULAR | Status: DC | PRN
  Administered 2023-05-07: 50 mg via INTRADERMAL

## 2023-05-07 MED ORDER — LACTATED RINGERS IV SOLN
INTRAVENOUS | Status: DC | PRN
Start: 2023-05-07 — End: 2023-05-07

## 2023-05-07 NOTE — Anesthesia Postprocedure Evaluation (Signed)
Anesthesia Post Note  Patient: Marilyn Martin  Procedure(s) Performed: COLONOSCOPY WITH PROPOFOL  Patient location during evaluation: PACU Anesthesia Type: General Level of consciousness: awake and alert Pain management: pain level controlled Vital Signs Assessment: post-procedure vital signs reviewed and stable Respiratory status: spontaneous breathing, nonlabored ventilation, respiratory function stable and patient connected to nasal cannula oxygen Cardiovascular status: blood pressure returned to baseline and stable Postop Assessment: no apparent nausea or vomiting Anesthetic complications: no   There were no known notable events for this encounter.   Last Vitals:  Vitals:   05/07/23 1228 05/07/23 1457  BP: 118/76 100/64  Pulse: 74 77  Resp:  18  Temp: 36.5 C 36.5 C  SpO2: 96% 100%    Last Pain:  Vitals:   05/07/23 1457  TempSrc: Oral  PainSc: 0-No pain                 Iley Deignan L Lennie Vasco

## 2023-05-07 NOTE — Transfer of Care (Signed)
Immediate Anesthesia Transfer of Care Note  Patient: Marilyn Martin  Procedure(s) Performed: COLONOSCOPY WITH PROPOFOL  Patient Location: Short Stay  Anesthesia Type:General  Level of Consciousness: awake and patient cooperative  Airway & Oxygen Therapy: Patient Spontanous Breathing  Post-op Assessment: Report given to RN and Post -op Vital signs reviewed and stable  Post vital signs: Reviewed and stable  Last Vitals:  Vitals Value Taken Time  BP 100/64 05/07/23  1500  Temp 97.7 05/06/23  1500  Pulse 79 05/07/23  1500  Resp 28 05/07/23  1500  SpO2 100 05/07/23  1500    Last Pain:  Vitals:   05/07/23 1432  TempSrc:   PainSc: 2       Patients Stated Pain Goal: 5 (05/07/23 1228)  Complications: No notable events documented.

## 2023-05-07 NOTE — Anesthesia Procedure Notes (Signed)
Date/Time: 05/07/2023 2:32 PM  Performed by: Julian Reil, CRNAPre-anesthesia Checklist: Patient identified, Emergency Drugs available, Suction available and Patient being monitored Patient Re-evaluated:Patient Re-evaluated prior to induction Oxygen Delivery Method: Nasal cannula Induction Type: IV induction Placement Confirmation: positive ETCO2 Comments: Optiflow High Flow Portis O2 used.

## 2023-05-07 NOTE — Op Note (Signed)
[facility] Patient Name: Marilyn Martin Procedure Date: 05/07/2023 2:23 PM MRN: [id number] Date of Birth: [birth year] Attending MD: Katrinka Blazing , , [id number] CSN: [id number] Age: 62 Admit Type: Outpatient Procedure:                Colonoscopy Indications:              Screening for colorectal malignant neoplasm Providers:                Katrinka Blazing, Buel Ream. Thomasena Edis RN, RN,                            Zena Amos Referring MD:             Katrinka Blazing Medicines:                Monitored Anesthesia Care Complications:            No immediate complications. Estimated Blood Loss:     Estimated blood loss: none. Procedure:                Pre-Anesthesia Assessment:                           - Prior to the procedure, a History and Physical                            was performed, and patient medications, allergies                            and sensitivities were reviewed. The patient's                            tolerance of previous anesthesia was reviewed.                           - The risks and benefits of the procedure and the                            sedation options and risks were discussed with the                            patient. All questions were answered and informed                            consent was obtained.                           - ASA Grade Assessment: II - A patient with mild                            systemic disease.                           After obtaining informed consent, the colonoscope                            was passed under direct vision. Throughout the  procedure, the patient's blood pressure, pulse, and                            oxygen saturations were monitored continuously. The                            [id number]-HQ190L ([id number]) scope was introduced through                            the anus and advanced to the the cecum, identified                            by appendiceal orifice and  ileocecal valve. The                            colonoscopy was performed without difficulty. The                            patient tolerated the procedure well. The quality                            of the bowel preparation was good. Scope In: 2:39:01 PM Scope Out: 2:51:44 PM Scope Withdrawal Time: 0 hours 9 minutes 43 seconds  Total Procedure Duration: 0 hours 12 minutes 43 seconds  Findings:      The perianal and digital rectal examinations were normal.      Scattered small-mouthed diverticula were found in the sigmoid colon,       descending colon and transverse colon.      Non-bleeding internal hemorrhoids were found during retroflexion. The       hemorrhoids were small. Impression:               - Diverticulosis in the sigmoid colon, in the                            descending colon and in the transverse colon.                           - Non-bleeding internal hemorrhoids.                           - No specimens collected. Moderate Sedation:      Per Anesthesia Care Recommendation:           - Discharge patient to home (ambulatory).                           - Resume previous diet.                           - Repeat colonoscopy in 10 years for screening                            purposes. Procedure Code(s):        --- Professional ---  U4403, Colorectal cancer screening; colonoscopy on                            individual not meeting criteria for high risk Diagnosis Code(s):        --- Professional ---                           Z12.11, Encounter for screening for malignant                            neoplasm of colon                           K64.8, Other hemorrhoids                           K57.30, Diverticulosis of large intestine without                            perforation or abscess without bleeding CPT copyright 2022 [employer]. All rights reserved. The codes documented in this report are preliminary and upon coder  review may  be revised to meet current compliance requirements. Katrinka Blazing, MD Katrinka Blazing,  05/07/2023 2:56:57 PM This report has been signed electronically. Number of Addenda: 0

## 2023-05-07 NOTE — Anesthesia Preprocedure Evaluation (Signed)
Anesthesia Evaluation  Patient identified by MRN, date of birth, ID band Patient awake    Reviewed: Allergy & Precautions, H&P , NPO status , Patient's Chart, lab work & pertinent test results, reviewed documented beta blocker date and time   Airway Mallampati: II  TM Distance: >3 FB Neck ROM: full    Dental no notable dental hx. (+) Dental Advisory Given, Teeth Intact   Pulmonary sleep apnea and Continuous Positive Airway Pressure Ventilation , former smoker   Pulmonary exam normal breath sounds clear to auscultation       Cardiovascular Exercise Tolerance: Good hypertension, Normal cardiovascular exam Rhythm:regular Rate:Normal     Neuro/Psych  Headaches  Anxiety      negative psych ROS   GI/Hepatic Neg liver ROS,GERD  ,,  Endo/Other    Class 3 obesity  Renal/GU Renal disease  negative genitourinary   Musculoskeletal  (+) Arthritis , Osteoarthritis,    Abdominal   Peds  Hematology negative hematology ROS (+)   Anesthesia Other Findings Hemidiaphragm paralysis  Reproductive/Obstetrics negative OB ROS                             Anesthesia Physical Anesthesia Plan  ASA: 3  Anesthesia Plan: General   Post-op Pain Management: Minimal or no pain anticipated   Induction: Intravenous  PONV Risk Score and Plan:   Airway Management Planned: Nasal Cannula and Natural Airway  Additional Equipment: None  Intra-op Plan:   Post-operative Plan:   Informed Consent: I have reviewed the patients History and Physical, chart, labs and discussed the procedure including the risks, benefits and alternatives for the proposed anesthesia with the patient or authorized representative who has indicated his/her understanding and acceptance.     Dental Advisory Given  Plan Discussed with: CRNA  Anesthesia Plan Comments:        Anesthesia Quick Evaluation

## 2023-05-07 NOTE — Discharge Instructions (Signed)
You are being discharged to home.  Resume your previous diet.  Your physician has recommended a repeat colonoscopy in 10 years for screening purposes.  

## 2023-05-07 NOTE — H&P (Signed)
Marilyn Martin is an 62 y.o. female.   Chief Complaint: Screening colonoscopy HPI: 63 year old female with past medical history of anxiety, fatty liver, GERD, hyperlipidemia, hypertension, IBS, sleep apnea, right hemidiaphragm dysfunction, coming for screening colonoscopy.  Last colonoscopy in 2013, had 3 hyperplastic polyps removed.  The patient denies having any complaints such as melena, hematochezia, abdominal pain or distention, change in her bowel movement consistency or frequency, no changes in weight recently.  No family history of colorectal cancer.   Past Medical History:  Diagnosis Date   Anxiety    Arthritis    osteo in knees and ankles    Constipation    Edema, lower extremity    Fatty liver    GERD (gastroesophageal reflux disease)    Headache    occ    High cholesterol    History of colon polyps    2013  hyperplastic   History of esophagitis    2013-- gerd   History of kidney stones    Hypertension    IBS (irritable bowel syndrome)    Joint pain    Left ureteral stone    Nephrolithiasis    left non-obstuctive per ct 06-22-2015   Osteoarthritis    Sleep apnea    cpap  auto starts at 2 may go up to 13    Vertigo    Vitamin D deficiency     Past Surgical History:  Procedure Laterality Date   CARDIOVASCULAR STRESS TEST  12-26-2012   normal perfusion study/  no ischemia or scar   CESAREAN SECTION  1987   w/  Bilateral Tubal Ligation   CHOLECYSTECTOMY N/A 07/31/2013   Procedure: LAPAROSCOPIC CHOLECYSTECTOMY;  Surgeon: Dalia Heading, MD;  Location: [facility];  Service: General;  Laterality: N/A;   COLONOSCOPY WITH ESOPHAGOGASTRODUODENOSCOPY (EGD)  04/25/2012   Procedure: COLONOSCOPY WITH ESOPHAGOGASTRODUODENOSCOPY (EGD);  Surgeon: Malissa Hippo, MD;  Location: [facility];  Service: Endoscopy;  Laterality: N/A;  730   CYSTOSCOPY W/ URETERAL STENT PLACEMENT Left 08/08/2015   Procedure: LEFT STENT REPLACEMENT;  Surgeon: Malen Gauze, MD;  Location[facility];  Service: Urology;  Laterality: Left;   CYSTOSCOPY W/ URETERAL STENT PLACEMENT Left 03/21/2023   Procedure: CYSTOSCOPY WITH RETROGRADE PYELOGRAM/URETERAL STENT PLACEMENT;  Surgeon: Malen Gauze, MD;  Location: [facility];  Service: Urology;  Laterality: Left;   CYSTOSCOPY WITH RETROGRADE PYELOGRAM, URETEROSCOPY AND STENT PLACEMENT Left 07/25/2015   Procedure: CYSTOSCOPY WITH LEFT RETROGRADE,;  Surgeon: Malen Gauze, MD;  Location: [facility];  Service: Urology;  Laterality: Left;   CYSTOSCOPY WITH RETROGRADE PYELOGRAM, URETEROSCOPY AND STENT PLACEMENT Left 04/18/2023   Procedure: CYSTOSCOPY WITH RETROGRADE PYELOGRAM, URETEROSCOPY AND STENT PLACEMENT- stent exchange;  Surgeon: Malen Gauze, MD;  Location: [facility];  Service: Urology;  Laterality: Left;   CYSTOSCOPY/RETROGRADE/URETEROSCOPY/STONE EXTRACTION WITH BASKET Left 08/08/2015   Procedure: CYSTOSCOPY LEFT RETROGRADE/URETEROSCOPY/STONE EXTRACTION WITH BASKET;  Surgeon: Malen Gauze, MD;  Location: [facility];  Service: Urology;  Laterality: Left;   DILATION AND CURETTAGE OF UTERUS  1986   HOLMIUM LASER APPLICATION Left 07/25/2015   Procedure: LEFT HOLMIUM LASER STONE EXTRACTION  AND LEFT STENT PLACEMENT;  Surgeon: Malen Gauze, MD;  Location: [facility];  Service: Urology;  Laterality: Left;   HOLMIUM LASER APPLICATION Left 08/08/2015   Procedure: HOLMIUM LASER APPLICATION;  Surgeon: Malen Gauze, MD;  Location: [facility];  Service: Urology;  Laterality: Left;   HOLMIUM LASER APPLICATION Left 04/18/2023  Procedure: HOLMIUM LASER APPLICATION;  Surgeon: Malen Gauze, MD;  Location: [facility];  Service: Urology;  Laterality: Left;   LAPAROSCOPIC GASTRIC SLEEVE RESECTION WITH HIATAL HERNIA REPAIR N/A 05/14/2016   Procedure: LAPAROSCOPIC GASTRIC SLEEVE RESECTION WITH HIATAL HERNIA REPAIR;  Surgeon: Luretha Murphy, MD;  Location: [facility];  Service: General;  Laterality: N/A;    LIVER BIOPSY N/A 07/31/2013   Procedure: LIVER BIOPSY;  Surgeon: Dalia Heading, MD;  Location: [facility];  Service: General;  Laterality: N/A;   MALONEY DILATION  04/25/2012   Procedure: Elease Hashimoto DILATION;  Surgeon: Malissa Hippo, MD;  Location: [facility];  Service: Endoscopy;  Laterality: N/A;   STONE EXTRACTION WITH BASKET  04/18/2023   Procedure: STONE EXTRACTION WITH BASKET;  Surgeon: Malen Gauze, MD;  Location: [facility];  Service: Urology;;   TONSILLECTOMY  1967   TRANSTHORACIC ECHOCARDIOGRAM  12-26-2012   mild LVH,  ef 55-60%,  grade 1 diastolic dysfunction   TUBAL LIGATION      Family History  Problem Relation Age of Onset   Alzheimer's disease Mother    Thyroid disease Mother    Cancer Mother    Sudden death Mother    Stroke Mother    Stroke Father    Heart attack Father    Diabetes Father    High blood pressure Father    High Cholesterol Father    Heart disease Father    Alzheimer's disease Sister    Diabetes Brother    Colon cancer Paternal Uncle    Social History:  reports that she quit smoking about 41 years ago. Her smoking use included cigarettes. She started smoking about 45 years ago. She has a 2 pack-year smoking history. She has never used smokeless tobacco. She reports current alcohol use. She reports that she does not use drugs.  Allergies:  Allergies  Allergen Reactions   Bee Venom Swelling   Macrobid [Nitrofurantoin] Hives and Rash    Medications Prior to Admission  Medication Sig Dispense Refill   acetaminophen (TYLENOL) 500 MG tablet Take 1,000 mg by mouth every 6 (six) hours as needed for headache.      albuterol (VENTOLIN HFA) 108 (90 Base) MCG/ACT inhaler INHALE 1-2 puffs INTO THE LUNGS EVERY 6 HOURS AS NEEDED FOR WHEEZING OR SHORTNESS OF BREATH 8.5 g 1   busPIRone (BUSPAR) 5 MG tablet Take 1 tablet (5 mg total) by mouth 2 (two) times daily. 60 tablet 3   torsemide (DEMADEX) 10 MG tablet Take 1 tablet (10 mg total) by mouth daily as  needed. (Patient taking differently: Take 20 mg by mouth daily.) 90 tablet 1   Vitamin D, Ergocalciferol, (DRISDOL) 1.25 MG (50000 UNIT) CAPS capsule Take 1 capsule (50,000 Units total) by mouth 2 (two) times a week. 25 capsule 4    No results found for this or any previous visit (from the past 48 hour(s)). No results found.  Review of Systems  All other systems reviewed and are negative.   Blood pressure 118/76, pulse 74, temperature 97.7 F (36.5 C), temperature source Oral, height 5\' 5"  (1.651 m), weight 104 kg, SpO2 96%. Physical Exam  GENERAL: The patient is AO x3, in no acute distress. HEENT: Head is normocephalic and atraumatic. EOMI are intact. Mouth is well hydrated and without lesions. NECK: Supple. No masses LUNGS: Clear to auscultation. No presence of rhonchi/wheezing/rales. Adequate chest expansion HEART: RRR, normal s1 and s2. ABDOMEN: Soft, nontender, no guarding, no peritoneal signs, and nondistended. BS +. No  masses. EXTREMITIES: Without any cyanosis, clubbing, rash, lesions or edema. NEUROLOGIC: AOx3, no focal motor deficit. SKIN: no jaundice, no rashes  Assessment/Plan 62 year old female with past medical history of anxiety, fatty liver, GERD, hyperlipidemia, hypertension, IBS, sleep apnea, right hemidiaphragm dysfunction, coming for screening colonoscopy.  Will proceed with colonoscopy.  Dolores Frame, MD 05/07/2023, 12:35 PM

## 2023-05-15 ENCOUNTER — Encounter (HOSPITAL_COMMUNITY): Payer: Self-pay | Admitting: Gastroenterology

## 2023-05-16 DIAGNOSIS — R519 Headache, unspecified: Secondary | ICD-10-CM | POA: Diagnosis not present

## 2023-05-16 DIAGNOSIS — Z299 Encounter for prophylactic measures, unspecified: Secondary | ICD-10-CM | POA: Diagnosis not present

## 2023-05-16 DIAGNOSIS — R0602 Shortness of breath: Secondary | ICD-10-CM | POA: Diagnosis not present

## 2023-05-16 DIAGNOSIS — Z6839 Body mass index (BMI) 39.0-39.9, adult: Secondary | ICD-10-CM | POA: Diagnosis not present

## 2023-05-17 ENCOUNTER — Encounter: Payer: Self-pay | Admitting: Neurology

## 2023-05-31 ENCOUNTER — Ambulatory Visit (HOSPITAL_COMMUNITY)
Admission: RE | Admit: 2023-05-31 | Discharge: 2023-05-31 | Disposition: A | Discharge status: Home / Self Care | Payer: 59 | Source: Ambulatory Visit | Attending: Urology | Admitting: Urology

## 2023-05-31 DIAGNOSIS — N200 Calculus of kidney: Secondary | ICD-10-CM | POA: Insufficient documentation

## 2023-05-31 DIAGNOSIS — Z0389 Encounter for observation for other suspected diseases and conditions ruled out: Secondary | ICD-10-CM | POA: Diagnosis not present

## 2023-06-19 ENCOUNTER — Other Ambulatory Visit (HOSPITAL_COMMUNITY): Payer: Self-pay

## 2023-06-19 ENCOUNTER — Ambulatory Visit: Payer: 59 | Admitting: Urology

## 2023-06-19 VITALS — BP 115/74 | HR 67

## 2023-06-19 DIAGNOSIS — N200 Calculus of kidney: Secondary | ICD-10-CM | POA: Diagnosis not present

## 2023-06-19 DIAGNOSIS — Z87442 Personal history of urinary calculi: Secondary | ICD-10-CM | POA: Diagnosis not present

## 2023-06-19 DIAGNOSIS — N3281 Overactive bladder: Secondary | ICD-10-CM | POA: Diagnosis not present

## 2023-06-19 LAB — URINALYSIS, ROUTINE W REFLEX MICROSCOPIC
Bilirubin, UA: NEGATIVE
Glucose, UA: NEGATIVE
Ketones, UA: NEGATIVE
Nitrite, UA: NEGATIVE
Protein,UA: NEGATIVE
RBC, UA: NEGATIVE
Specific Gravity, UA: 1.03 (ref 1.005–1.030)
Urobilinogen, Ur: 0.2 mg/dL (ref 0.2–1.0)
pH, UA: 6 (ref 5.0–7.5)

## 2023-06-19 LAB — MICROSCOPIC EXAMINATION: Bacteria, UA: NONE SEEN

## 2023-06-19 MED ORDER — GEMTESA 75 MG PO TABS: 1.0000 | ORAL_TABLET | Freq: Every day | ORAL | Status: AC

## 2023-06-19 NOTE — Progress Notes (Signed)
06/19/2023 11:42 AM   Marilyn Martin 1961-03-15 [id number]  Referring provider: Ignatius Specking, MD [address] [place],  [place] [address]  Followup nephrolithiasis   HPI: [name] is a 63yo here for followup for nephrolithiasis. No stone events since last visit. Renal US 12/27 showed no hydronephrosis and no calculi. For the past several years she has noted worsening urinary frequency and urgency. She has urinary frequency every 90 minutes. She uses 2 pads per day. She has nocturia 1-2x even with decreasing fluid intake prior to going to bed    PMH: Past Medical History:  Diagnosis Date   Anxiety    Arthritis    osteo in knees and ankles    Constipation    Edema, lower extremity    Fatty liver    GERD (gastroesophageal reflux disease)    Headache    occ    High cholesterol    History of colon polyps    2013  hyperplastic   History of esophagitis    2013-- gerd   History of kidney stones    Hypertension    IBS (irritable bowel syndrome)    Joint pain    Left ureteral stone    Nephrolithiasis    left non-obstuctive per ct 06-22-2015   Osteoarthritis    Sleep apnea    cpap  auto starts at 2 may go up to 13    Vertigo    Vitamin D deficiency     Surgical History: Past Surgical History:  Procedure Laterality Date   CARDIOVASCULAR STRESS TEST  12-26-2012   normal perfusion study/  no ischemia or scar   CESAREAN SECTION  1987   w/  Bilateral Tubal Ligation   CHOLECYSTECTOMY N/A 07/31/2013   Procedure: LAPAROSCOPIC CHOLECYSTECTOMY;  Surgeon: Dalia Heading, MD;  Location: [facility];  Service: General;  Laterality: N/A;   COLONOSCOPY WITH ESOPHAGOGASTRODUODENOSCOPY (EGD)  04/25/2012   Procedure: COLONOSCOPY WITH ESOPHAGOGASTRODUODENOSCOPY (EGD);  Surgeon: Malissa Hippo, MD;  [facility];  Service: Endoscopy;  Laterality: N/A;  730   COLONOSCOPY WITH PROPOFOL N/A 05/07/2023   Procedure: COLONOSCOPY WITH PROPOFOL;  Surgeon: Dolores Frame, MD;   [facility];  Service: Gastroenterology;  Laterality: N/A;  100PM, ASA 3   CYSTOSCOPY W/ URETERAL STENT PLACEMENT Left 08/08/2015   Procedure: LEFT STENT REPLACEMENT;  Surgeon: Malen Gauze, MD;  Location: [facility];  Service: Urology;  Laterality: Left;   CYSTOSCOPY W/ URETERAL STENT PLACEMENT Left 03/21/2023   Procedure: CYSTOSCOPY WITH RETROGRADE PYELOGRAM/URETERAL STENT PLACEMENT;  Surgeon: Malen Gauze, MD;  Location: [facility];  Service: Urology;  Laterality: Left;   CYSTOSCOPY WITH RETROGRADE PYELOGRAM, URETEROSCOPY AND STENT PLACEMENT Left 07/25/2015   Procedure: CYSTOSCOPY WITH LEFT RETROGRADE,;  Surgeon: Malen Gauze, MD;  [facility]: [facility];  Service: Urology;  Laterality: Left;   CYSTOSCOPY WITH RETROGRADE PYELOGRAM, URETEROSCOPY AND STENT PLACEMENT Left 04/18/2023   Procedure: CYSTOSCOPY WITH RETROGRADE PYELOGRAM, URETEROSCOPY AND STENT PLACEMENT- stent exchange;  Surgeon: Malen Gauze, MD;  [facility];  Service: Urology;  Laterality: Left;   CYSTOSCOPY/RETROGRADE/URETEROSCOPY/STONE EXTRACTION WITH BASKET Left 08/08/2015   Procedure: CYSTOSCOPY LEFT RETROGRADE/URETEROSCOPY/STONE EXTRACTION WITH BASKET;  Surgeon: Malen Gauze, MD;  Location[facility];  Service: Urology;  Laterality: Left;   DILATION AND CURETTAGE OF UTERUS  1986   HOLMIUM LASER APPLICATION Left 07/25/2015   Procedure: LEFT HOLMIUM LASER STONE EXTRACTION  AND LEFT STENT PLACEMENT;  Surgeon: Mardene Celeste  Finnleigh Marchetti, MD;  Location[facility];  Service: Urology;  Laterality: Left;   HOLMIUM LASER APPLICATION Left 08/08/2015   Procedure: HOLMIUM LASER APPLICATION;  Surgeon: Malen Gauze, MD;  Location: [facility] [place];  Service: Urology;  Laterality: Left;   HOLMIUM LASER APPLICATION Left 04/18/2023   Procedure: HOLMIUM LASER APPLICATION;  Surgeon: Malen Gauze, MD;  [facility];  Service: Urology;  Laterality: Left;   LAPAROSCOPIC  GASTRIC SLEEVE RESECTION WITH HIATAL HERNIA REPAIR N/A 05/14/2016   Procedure: LAPAROSCOPIC GASTRIC SLEEVE RESECTION WITH HIATAL HERNIA REPAIR;  Surgeon: Luretha Murphy, MD;  [facility];  Service: General;  Laterality: N/A;   LIVER BIOPSY N/A 07/31/2013   Procedure: LIVER BIOPSY;  Surgeon: Dalia Heading, MD;  [facility];  Service: General;  Laterality: N/A;   MALONEY DILATION  04/25/2012   Procedure: Elease Hashimoto DILATION;  Surgeon: Malissa Hippo, MD;  [facility];  Service: Endoscopy;  Laterality: N/A;   STONE EXTRACTION WITH BASKET  04/18/2023   Procedure: STONE EXTRACTION WITH BASKET;  Surgeon: Malen Gauze, MD;  Location: [facility];  Service: Urology;;   TONSILLECTOMY  1967   TRANSTHORACIC ECHOCARDIOGRAM  12-26-2012   mild LVH,  ef 55-60%,  grade 1 diastolic dysfunction   TUBAL LIGATION      Home Medications:  Allergies as of 06/19/2023       Reactions   Bee Venom Swelling   Macrobid [nitrofurantoin] Hives, Rash        Medication List        Accurate as of June 19, 2023 11:42 AM. If you have any questions, ask your nurse or doctor.          acetaminophen 500 MG tablet Commonly known as: TYLENOL Take 1,000 mg by mouth every 6 (six) hours as needed for headache.   albuterol 108 (90 Base) MCG/ACT inhaler Commonly known as: VENTOLIN HFA INHALE 1-2 puffs INTO THE LUNGS EVERY 6 HOURS AS NEEDED FOR WHEEZING OR SHORTNESS OF BREATH   busPIRone 5 MG tablet Commonly known as: BUSPAR Take 1 tablet (5 mg total) by mouth 2 (two) times daily.   torsemide 10 MG tablet Commonly known as: DEMADEX Take 1 tablet (10 mg total) by mouth daily as needed. What changed:  how much to take when to take this   Vitamin D (Ergocalciferol) 1.25 MG (50000 UNIT) Caps capsule Commonly known as: DRISDOL Take 1 capsule (50,000 Units total) by mouth 2 (two) times a week.        Allergies:  Allergies  Allergen Reactions   Bee Venom Swelling   Macrobid  [Nitrofurantoin] Hives and Rash    Family History: Family History  Problem Relation Age of Onset   Alzheimer's disease Mother    Thyroid disease Mother    Cancer Mother    Sudden death Mother    Stroke Mother    Stroke Father    Heart attack Father    Diabetes Father    High blood pressure Father    High Cholesterol Father    Heart disease Father    Alzheimer's disease Sister    Diabetes Brother    Colon cancer Paternal Uncle     Social History:  reports that she quit smoking about 42 years ago. Her smoking use included cigarettes. She started smoking about 46 years ago. She has a 2 pack-year smoking history. She has never used smokeless tobacco. She reports current alcohol use. She reports that she does not use drugs.  ROS: All other review of systems were reviewed and are negative except what is noted above in HPI  Physical Exam: BP 115/74   Pulse 67   Constitutional:  Alert and oriented, No acute distress. HEENT: [place] AT, moist mucus membranes.  Trachea midline, no masses. Cardiovascular: No clubbing, cyanosis, or edema. Respiratory: Normal respiratory effort, no increased work of breathing. GI: Abdomen is soft, nontender, nondistended, no abdominal masses GU: No CVA tenderness.  Lymph: No cervical or inguinal lymphadenopathy. Skin: No rashes, bruises or suspicious lesions. Neurologic: Grossly intact, no focal deficits, moving all 4 extremities. Psychiatric: Normal mood and affect.  Laboratory Data: Lab Results  Component Value Date   WBC 7.9 03/24/2023   HGB 13.8 03/24/2023   HCT 41.7 03/24/2023   MCV 92.3 03/24/2023   PLT 213 03/24/2023    Lab Results  Component Value Date   CREATININE 0.85 04/24/2023    No results found for: "PSA"  No results found for: "TESTOSTERONE"  Lab Results  Component Value Date   HGBA1C 5.5 01/11/2022    Urinalysis    Component Value Date/Time   COLORURINE YELLOW 03/20/2023 0013   APPEARANCEUR Clear 04/24/2023 0835    LABSPEC 1.018 03/20/2023 0013   PHURINE 5.0 03/20/2023 0013   GLUCOSEU Negative 04/24/2023 0835   HGBUR MODERATE (A) 03/20/2023 0013   BILIRUBINUR Negative 04/24/2023 0835   KETONESUR NEGATIVE 03/20/2023 0013   PROTEINUR Negative 04/24/2023 0835   PROTEINUR 100 (A) 03/20/2023 0013   NITRITE Negative 04/24/2023 0835   NITRITE NEGATIVE 03/20/2023 0013   LEUKOCYTESUR Negative 04/24/2023 0835   LEUKOCYTESUR MODERATE (A) 03/20/2023 0013    Lab Results  Component Value Date   LABMICR Comment 04/24/2023   WBCUA 11-30 (A) 04/11/2023   LABEPIT 0-10 04/11/2023   BACTERIA Moderate (A) 04/11/2023    Pertinent Imaging: Renal US 05/31/2023: Images reviewed and discussed with the patient  Results for orders placed during the hospital encounter of 07/23/15  DG Abd 1 View  Narrative CLINICAL DATA:  Left-sided abdominal pain 2-3 days. Known left ureteral stone.  EXAM: ABDOMEN - 1 VIEW  COMPARISON:  07/12/2015  FINDINGS: Bowel gas pattern is nonobstructive. There is been fragmentation of patient's large proximal left ureteral stone with moderate residual fragments over the proximal ureter. Suggestion of small residual fragments over the distal left ureter. Small stones over the lower pole left kidney. Minimal degenerate change of the spine and hips.  IMPRESSION: Left nephrolithiasis. Interval fragmentation of patient's large proximal left ureteral stone with several prominent residual fragments over the approximate ureter as well small fragments over the distal ureter.  Nonobstructive bowel gas pattern.   Electronically Signed By: Elberta Fortis M.D. On: 07/23/2015 21:12  No results found for this or any previous visit.  No results found for this or any previous visit.  No results found for this or any previous visit.  Results for orders placed during the hospital encounter of 05/31/23  US RENAL  Narrative : PROCEDURE: US RENAL  HISTORY: Patient is a 63 y/o  F with  nephrolithiasis.  COMPARISON: CT abdomen/pelvis 03/21/2023, U/S renal 07/02/2017.  TECHNIQUE: Two-dimensional grayscale and color Doppler ultrasound of the kidneys was performed.  FINDINGS: The urinary bladder demonstrates normal anechoic echogenicity. The bilateral ureteral jets are visualized.  The right kidney measures 11.6 cm. Renal cortical echotexture is normal. There is no hydronephrosis. There are no stones. There are no cysts.  The left kidney measures 11.5 cm. Renal cortical echotexture is normal. There is no hydronephrosis.  There are no stones. There are no cysts.  IMPRESSION: 1.  Unremarkable renal ultrasound.  No evidence of calculi.  Thank you for allowing [place] to assist in the care of this patient.   Electronically Signed By: Lestine Box M.D. On: 06/01/2023 08:18  No results found for this or any previous visit.  No results found for this or any previous visit.  Results for orders placed during the hospital encounter of 06/22/15  CT RENAL STONE STUDY  Narrative CLINICAL DATA:  RIGHT side back pain with pain down RIGHT leg since 05/29/2015, history kidney stones, abnormal radiograph  EXAM: CT ABDOMEN AND PELVIS WITHOUT CONTRAST  TECHNIQUE: Multidetector CT imaging of the abdomen and pelvis was performed following the standard protocol without IV contrast. Sagittal and coronal MPR images reconstructed from axial data set. Oral contrast not administered for this indication.  COMPARISON:  01/25/2004; correlation lumbar spine radiographs 06/16/2015  FINDINGS: Lung bases clear.  14 x 13 x 21 mm diameter calculus at LEFT ureteropelvic junction with moderate LEFT hydronephrosis.  Additional tiny nonobstructing LEFT renal calculi.  Ureters and bladder normal appearance.  Within limits of a nonenhanced exam no focal abnormalities of the liver, spleen, pancreas, RIGHT kidney, or adrenal glands.  Gallbladder surgically absent.  Normal appendix,  uterus, and adnexa.  Stomach and bowel loops normal appearance.  Umbilical hernia containing fat.  No mass, adenopathy, free air, free fluid, or inflammatory process.  Bones unremarkable.  No definite lumbar disc herniation or neural compression.  IMPRESSION: Large 14 x 13 x 21 mm LEFT UPJ calculus with moderate LEFT hydronephrosis.  Additional tiny nonobstructing LEFT renal calculi.  Umbilical hernia containing fat.   Electronically Signed By: Ulyses Southward M.D. On: 06/22/2015 16:41   Assessment & Plan:    1. Nephrolithiasis (Primary) She is to complete 24 hour urine and we will call with the results -followup 6 motnhs with renal US - Urinalysis, Routine w reflex microscopic  2. OAB -we will trial Gemtesa 75mg  daily  No follow-ups on file.  Wilkie Aye, MD  [facility] [place]

## 2023-06-25 ENCOUNTER — Encounter: Payer: Self-pay | Admitting: Urology

## 2023-06-25 NOTE — Patient Instructions (Signed)

## 2023-06-28 ENCOUNTER — Ambulatory Visit (INDEPENDENT_AMBULATORY_CARE_PROVIDER_SITE_OTHER): Payer: 59 | Admitting: Neurology

## 2023-06-28 ENCOUNTER — Encounter: Payer: Self-pay | Admitting: Neurology

## 2023-06-28 VITALS — BP 131/76 | HR 97 | Ht 65.0 in | Wt 234.0 lb

## 2023-06-28 DIAGNOSIS — Z820 Family history of epilepsy and other diseases of the nervous system: Secondary | ICD-10-CM | POA: Diagnosis not present

## 2023-06-28 DIAGNOSIS — H5704 Mydriasis: Secondary | ICD-10-CM

## 2023-06-28 DIAGNOSIS — G43009 Migraine without aura, not intractable, without status migrainosus: Secondary | ICD-10-CM | POA: Diagnosis not present

## 2023-06-28 NOTE — Progress Notes (Signed)
NEUROLOGY CONSULTATION NOTE  Marilyn Martin MRN: [id number] DOB: 30-May-1961  Referring provider: Doreen Beam, MD Primary care provider: Doreen Beam, MD  Reason for consult:  headache  Assessment/Plan:   Migraine without aura, without status migrainosus, not intractable.  She had an intractable migraine but now back at baseline Transient dilated pupil.  Unclear etiology but do not suspect concerning intracranial etiology as it had only lasted several hours and then resolved.   Family history of Alzheimer's disease   As she is neurologically intact and prior symptoms were transient and has since long resolved, no further imaging is warranted Regarding concern for Alzheimer's, she currently is asymptomatic.  Therefore, workup not warranted.  However, I did tell her that there may be studies at the academic centers (such as [facility] or [facility]) where people with strong family history may participate.   Follow up as needed.   Subjective:  Marilyn Martin is a 63 year old right-handed female with HTN, OSA, arthritis, anxiety, IBS, and history of kidney stones with sepsis who presents for headaches.  History supplemented by referring provider's note.  She has history of migraines since her 30s.  They are severe unilateral (either side) headaches associated with nausea, photophobia, phonophobia, osmophobia and occasional vomiting but no visual symptoms.  Usually lasts 3-4 hours with Tylenol or Flexeril and occurs once a month.No know triggers.  During the summer of 2024, she had an unusual severe intractable migraine that lasted 2 1/2 days.  She may have had some increased work-related stress but no obvious trigger.  Since then, her headaches have returned to baseline.    She has had 2 episodes of ocular migraines in the past presenting with squiggly lines in her visual field.  In December, she noted that her left eye was dilated.  It lasted about 4 to 5 hours and spontaneously resolved.  No  associated headache, preceding headache injury or other focal neurologic deficits.  She did not see an ophthalmologist.   She is also concern about developing Alzheimer's disease.  Both her mother and sister were diagnosed in their early 69s.  She currently denies any memory or other cognitive dysfunction.     Past NSAIDS/analgesics:  none Past abortive triptans:  none Past abortive ergotamine:  none Past muscle relaxants:  tizanidine Past anti-emetic:  none Past antihypertensive medications:  none Past antidepressant medications:  none Past anticonvulsant medications:  none Past anti-CGRP:  none Other past therapies:  none  Current NSAIDS/analgesics:  Tylenol Current triptans:  none Current ergotamine:  none Current anti-emetic:  none Current muscle relaxants:  none Current Antihypertensive medications:  torsemide Current Antidepressant medications:  none Current Anticonvulsant medications:  none Current anti-CGRP:  none Other medications:  buspirone, albuterol  Caffeine:  1 cup coffee daily.   Alcohol:  occasional Smoker:  no Diet:  less than 20 oz water daily.  Skips meals.  No specific diet.   Exercise:  no Depression:  no; Anxiety:  yes Sleep hygiene:  poor.  Difficulty staying asleep.  Tries to go to bed at 9 PM.  Awake between 3-3:30 AM.  Normally gets up at 4:30 AM Family history of headache:  no      PAST MEDICAL HISTORY: Past Medical History:  Diagnosis Date   Anxiety    Arthritis    osteo in knees and ankles    Constipation    Edema, lower extremity    Fatty liver    GERD (gastroesophageal reflux disease)    Headache  occ    High cholesterol    History of colon polyps    2013  hyperplastic   History of esophagitis    2013-- gerd   History of kidney stones    Hypertension    IBS (irritable bowel syndrome)    Joint pain    Left ureteral stone    Nephrolithiasis    left non-obstuctive per ct 06-22-2015   Osteoarthritis    Sleep apnea    cpap   auto starts at 2 may go up to 13    Vertigo    Vitamin D deficiency     PAST SURGICAL HISTORY: Past Surgical History:  Procedure Laterality Date   CARDIOVASCULAR STRESS TEST  12-26-2012   normal perfusion study/  no ischemia or scar   CESAREAN SECTION  1987   w/  Bilateral Tubal Ligation   CHOLECYSTECTOMY N/A 07/31/2013   Procedure: LAPAROSCOPIC CHOLECYSTECTOMY;  Surgeon: Dalia Heading, MD;  Location: [facility];  Service: General;  Laterality: N/A;   COLONOSCOPY WITH ESOPHAGOGASTRODUODENOSCOPY (EGD)  04/25/2012   Procedure: COLONOSCOPY WITH ESOPHAGOGASTRODUODENOSCOPY (EGD);  Surgeon: Malissa Hippo, MD;  [facility];  Service: Endoscopy;  Laterality: N/A;  730   COLONOSCOPY WITH PROPOFOL N/A 05/07/2023   Procedure: COLONOSCOPY WITH PROPOFOL;  Surgeon: Dolores Frame, MD;  [facility];  Service: Gastroenterology;  Laterality: N/A;  100PM, ASA 3   CYSTOSCOPY W/ URETERAL STENT PLACEMENT Left 08/08/2015   Procedure: LEFT STENT REPLACEMENT;  Surgeon: Malen Gauze, MD;  Location: [facility];  Service: Urology;  Laterality: Left;   CYSTOSCOPY W/ URETERAL STENT PLACEMENT Left 03/21/2023   Procedure: CYSTOSCOPY WITH RETROGRADE PYELOGRAM/URETERAL STENT PLACEMENT;  Surgeon: Malen Gauze, MD;  [facility];  Service: Urology;  Laterality: Left;   CYSTOSCOPY WITH RETROGRADE PYELOGRAM, URETEROSCOPY AND STENT PLACEMENT Left 07/25/2015   Procedure: CYSTOSCOPY WITH LEFT RETROGRADE,;  Surgeon: Malen Gauze, MD;  [facility];  Service: Urology;  Laterality: Left;   CYSTOSCOPY WITH RETROGRADE PYELOGRAM, URETEROSCOPY AND STENT PLACEMENT Left 04/18/2023   Procedure: CYSTOSCOPY WITH RETROGRADE PYELOGRAM, URETEROSCOPY AND STENT PLACEMENT- stent exchange;  Surgeon: Malen Gauze, MD;  Location[facility];  Service: Urology;  Laterality: Left;   CYSTOSCOPY/RETROGRADE/URETEROSCOPY/STONE EXTRACTION WITH BASKET Left 08/08/2015   Procedure:  CYSTOSCOPY LEFT RETROGRADE/URETEROSCOPY/STONE EXTRACTION WITH BASKET;  Surgeon: Malen Gauze, MD;  Location: [facility];  Service: Urology;  Laterality: Left;   DILATION AND CURETTAGE OF UTERUS  1986   HOLMIUM LASER APPLICATION Left 07/25/2015   Procedure: LEFT HOLMIUM LASER STONE EXTRACTION  AND LEFT STENT PLACEMENT;  Surgeon: Malen Gauze, MD;  [facility];  Service: Urology;  Laterality: Left;   HOLMIUM LASER APPLICATION Left 08/08/2015   Procedure: HOLMIUM LASER APPLICATION;  Surgeon: Malen Gauze, MD;  Location: [facility];  Service: Urology;  Laterality: Left;   HOLMIUM LASER APPLICATION Left 04/18/2023   Procedure: HOLMIUM LASER APPLICATION;  Surgeon: Malen Gauze, MD;  Location: [facility];  Service: Urology;  Laterality: Left;   LAPAROSCOPIC GASTRIC SLEEVE RESECTION WITH HIATAL HERNIA REPAIR N/A 05/14/2016   Procedure: LAPAROSCOPIC GASTRIC SLEEVE RESECTION WITH HIATAL HERNIA REPAIR;  Surgeon: Luretha Murphy, MD;  [facility];  Service: General;  Laterality: N/A;   LIVER BIOPSY N/A 07/31/2013   Procedure: LIVER BIOPSY;  Surgeon: Dalia Heading, MD;  Location[facility];  Service: General;  Laterality: N/A;   MALONEY DILATION  04/25/2012   Procedure: MALONEY DILATION;  Surgeon: Malissa Hippo, MD;  [facility];  Service: Endoscopy;  Laterality: N/A;   STONE EXTRACTION WITH BASKET  04/18/2023   Procedure: STONE EXTRACTION WITH BASKET;  Surgeon: Malen Gauze, MD;  Location[facility];  Service: Urology;;   TONSILLECTOMY  1967   TRANSTHORACIC ECHOCARDIOGRAM  12-26-2012   mild LVH,  ef 55-60%,  grade 1 diastolic dysfunction   TUBAL LIGATION      MEDICATIONS: Current Outpatient Medications on File Prior to Visit  Medication Sig Dispense Refill   acetaminophen (TYLENOL) 500 MG tablet Take 1,000 mg by mouth every 6 (six) hours as needed for headache.      albuterol (VENTOLIN HFA) 108 (90 Base) MCG/ACT inhaler INHALE  1-2 puffs INTO THE LUNGS EVERY 6 HOURS AS NEEDED FOR WHEEZING OR SHORTNESS OF BREATH 8.5 g 1   busPIRone (BUSPAR) 5 MG tablet Take 1 tablet (5 mg total) by mouth 2 (two) times daily. 60 tablet 3   torsemide (DEMADEX) 10 MG tablet Take 1 tablet (10 mg total) by mouth daily as needed. (Patient taking differently: Take 20 mg by mouth daily.) 90 tablet 1   Vibegron (GEMTESA) 75 MG TABS Take 1 tablet (75 mg total) by mouth daily.     Vitamin D, Ergocalciferol, (DRISDOL) 1.25 MG (50000 UNIT) CAPS capsule Take 1 capsule (50,000 Units total) by mouth 2 (two) times a week. 25 capsule 4   No current facility-administered medications on file prior to visit.    ALLERGIES: Allergies  Allergen Reactions   Bee Venom Swelling   Macrobid [Nitrofurantoin] Hives and Rash    FAMILY HISTORY: Family History  Problem Relation Age of Onset   Alzheimer's disease Mother    Thyroid disease Mother    Cancer Mother    Sudden death Mother    Stroke Mother    Stroke Father    Heart attack Father    Diabetes Father    High blood pressure Father    High Cholesterol Father    Heart disease Father    Alzheimer's disease Sister    Diabetes Brother    Colon cancer Paternal Uncle     Objective:  Blood pressure 131/76, pulse 97, height 5\' 5"  (1.651 m), weight 234 lb (106.1 kg), SpO2 96%. General: No acute distress.  Patient appears well-groomed.   Head:  Normocephalic/atraumatic Eyes:  fundi examined but not visualized Neck: supple, no paraspinal tenderness, full range of motion Heart: regular rate and rhythm Neurological Exam: Mental status: alert and oriented to person, place, and time, speech fluent and not dysarthric, language intact. Cranial nerves: CN I: not tested CN II: pupils equal, round and reactive to light, visual fields intact CN III, IV, VI:  full range of motion, no nystagmus, no ptosis CN V: facial sensation intact. CN VII: upper and lower face symmetric CN VIII: hearing intact CN IX, X:  gag intact, uvula midline CN XI: sternocleidomastoid and trapezius muscles intact CN XII: tongue midline Bulk & Tone: normal, no fasciculations. Motor:  muscle strength 5/5 throughout Sensation:  Pinprick and vibratory sensation intact. Deep Tendon Reflexes:  2+ throughout,  toes downgoing.   Finger to nose testing:  Without dysmetria.   Heel to shin:  Without dysmetria.   Gait:  Normal station and stride.  Romberg negative.    Thank you for allowing me to take part in the care of this patient.  Shon Millet, DO  CC: Doreen Beam, MD

## 2023-07-18 DIAGNOSIS — L508 Other urticaria: Secondary | ICD-10-CM | POA: Diagnosis not present

## 2023-07-18 DIAGNOSIS — R21 Rash and other nonspecific skin eruption: Secondary | ICD-10-CM | POA: Diagnosis not present

## 2023-07-18 DIAGNOSIS — Z6841 Body Mass Index (BMI) 40.0 and over, adult: Secondary | ICD-10-CM | POA: Diagnosis not present

## 2023-07-18 DIAGNOSIS — Z299 Encounter for prophylactic measures, unspecified: Secondary | ICD-10-CM | POA: Diagnosis not present

## 2023-09-13 DIAGNOSIS — Z01419 Encounter for gynecological examination (general) (routine) without abnormal findings: Secondary | ICD-10-CM | POA: Diagnosis not present

## 2023-09-13 DIAGNOSIS — Z6839 Body mass index (BMI) 39.0-39.9, adult: Secondary | ICD-10-CM | POA: Diagnosis not present

## 2023-09-13 DIAGNOSIS — Z1231 Encounter for screening mammogram for malignant neoplasm of breast: Secondary | ICD-10-CM | POA: Diagnosis not present

## 2023-09-14 ENCOUNTER — Other Ambulatory Visit (HOSPITAL_BASED_OUTPATIENT_CLINIC_OR_DEPARTMENT_OTHER): Payer: Self-pay

## 2023-09-14 ENCOUNTER — Other Ambulatory Visit (HOSPITAL_COMMUNITY): Payer: Self-pay

## 2023-09-14 MED ORDER — BUSPIRONE HCL 5 MG PO TABS
5.0000 mg | ORAL_TABLET | Freq: Two times a day (BID) | ORAL | 3 refills | Status: AC
  Filled 2023-09-14: qty 60, 30d supply, fill #0
  Filled 2023-10-12: qty 60, 30d supply, fill #1
  Filled 2023-11-11: qty 60, 30d supply, fill #2
  Filled 2023-12-18: qty 60, 30d supply, fill #3

## 2023-09-17 ENCOUNTER — Other Ambulatory Visit: Payer: Self-pay

## 2023-09-30 DIAGNOSIS — Z820 Family history of epilepsy and other diseases of the nervous system: Secondary | ICD-10-CM | POA: Diagnosis not present

## 2023-09-30 DIAGNOSIS — R52 Pain, unspecified: Secondary | ICD-10-CM | POA: Diagnosis not present

## 2023-09-30 DIAGNOSIS — Z299 Encounter for prophylactic measures, unspecified: Secondary | ICD-10-CM | POA: Diagnosis not present

## 2023-09-30 DIAGNOSIS — H21562 Pupillary abnormality, left eye: Secondary | ICD-10-CM | POA: Diagnosis not present

## 2023-10-12 ENCOUNTER — Other Ambulatory Visit (HOSPITAL_COMMUNITY): Payer: Self-pay

## 2023-11-11 ENCOUNTER — Other Ambulatory Visit (HOSPITAL_COMMUNITY): Payer: Self-pay

## 2023-12-05 DIAGNOSIS — R350 Frequency of micturition: Secondary | ICD-10-CM | POA: Diagnosis not present

## 2023-12-05 DIAGNOSIS — E559 Vitamin D deficiency, unspecified: Secondary | ICD-10-CM | POA: Diagnosis not present

## 2023-12-05 DIAGNOSIS — Z0000 Encounter for general adult medical examination without abnormal findings: Secondary | ICD-10-CM | POA: Diagnosis not present

## 2023-12-05 DIAGNOSIS — E7800 Pure hypercholesterolemia, unspecified: Secondary | ICD-10-CM | POA: Diagnosis not present

## 2023-12-05 DIAGNOSIS — R519 Headache, unspecified: Secondary | ICD-10-CM | POA: Diagnosis not present

## 2023-12-05 DIAGNOSIS — F419 Anxiety disorder, unspecified: Secondary | ICD-10-CM | POA: Diagnosis not present

## 2023-12-05 DIAGNOSIS — R5383 Other fatigue: Secondary | ICD-10-CM | POA: Diagnosis not present

## 2023-12-05 DIAGNOSIS — Z299 Encounter for prophylactic measures, unspecified: Secondary | ICD-10-CM | POA: Diagnosis not present

## 2023-12-05 DIAGNOSIS — Z79899 Other long term (current) drug therapy: Secondary | ICD-10-CM | POA: Diagnosis not present

## 2023-12-05 DIAGNOSIS — Z9884 Bariatric surgery status: Secondary | ICD-10-CM | POA: Diagnosis not present

## 2023-12-05 DIAGNOSIS — Z1331 Encounter for screening for depression: Secondary | ICD-10-CM | POA: Diagnosis not present

## 2023-12-11 ENCOUNTER — Ambulatory Visit (HOSPITAL_COMMUNITY)
Admission: RE | Admit: 2023-12-11 | Discharge: 2023-12-11 | Disposition: A | Discharge status: Home / Self Care | Payer: 59 | Source: Ambulatory Visit | Attending: Urology | Admitting: Urology

## 2023-12-11 DIAGNOSIS — N200 Calculus of kidney: Secondary | ICD-10-CM | POA: Diagnosis not present

## 2023-12-11 DIAGNOSIS — N1330 Unspecified hydronephrosis: Secondary | ICD-10-CM | POA: Diagnosis not present

## 2023-12-12 ENCOUNTER — Encounter (HOSPITAL_COMMUNITY): Payer: Self-pay | Admitting: *Deleted

## 2023-12-18 ENCOUNTER — Other Ambulatory Visit (HOSPITAL_COMMUNITY): Payer: Self-pay

## 2023-12-18 ENCOUNTER — Ambulatory Visit: Payer: 59 | Admitting: Urology

## 2023-12-18 ENCOUNTER — Ambulatory Visit (HOSPITAL_COMMUNITY)
Admission: RE | Admit: 2023-12-18 | Discharge: 2023-12-18 | Disposition: A | Discharge status: Home / Self Care | Source: Ambulatory Visit | Attending: Urology | Admitting: Urology

## 2023-12-18 VITALS — BP 122/78 | HR 78

## 2023-12-18 DIAGNOSIS — Z9049 Acquired absence of other specified parts of digestive tract: Secondary | ICD-10-CM | POA: Diagnosis not present

## 2023-12-18 DIAGNOSIS — N200 Calculus of kidney: Secondary | ICD-10-CM

## 2023-12-18 DIAGNOSIS — N3281 Overactive bladder: Secondary | ICD-10-CM | POA: Diagnosis not present

## 2023-12-18 MED ORDER — MIRABEGRON ER 25 MG PO TB24
25.0000 mg | ORAL_TABLET | Freq: Every day | ORAL | 11 refills | Status: AC
  Filled 2023-12-18: qty 30, 30d supply, fill #0

## 2023-12-18 NOTE — Progress Notes (Signed)
 12/18/2023 9:41 AM   Marilyn Martin 19-Aug-1960 [id number]  Referring provider: Rosamond Leta NOVAK, MD [address] [place],  [place] [address]  Followup nephrolithiasis   HPI: [name] is a 62yo here for followup for OAB and nephrolithiasis. Renal US  shows new right hydronephrosis. She denies any right flank pain. She denies nay stone events since last visit. Gemtesa  improved her incontinence to where she does not have to wear a pad.    PMH: Past Medical History:  Diagnosis Date   Anxiety    Arthritis    osteo in knees and ankles    Constipation    Edema, lower extremity    Fatty liver    GERD (gastroesophageal reflux disease)    Headache    occ    High cholesterol    History of colon polyps    2013  hyperplastic   History of esophagitis    2013-- gerd   History of kidney stones    Hypertension    IBS (irritable bowel syndrome)    Joint pain    Left ureteral stone    Nephrolithiasis    left non-obstuctive per ct 06-22-2015   Osteoarthritis    Sleep apnea    cpap  auto starts at 2 may go up to 13    Vertigo    Vitamin D  deficiency     Surgical History: Past Surgical History:  Procedure Laterality Date   CARDIOVASCULAR STRESS TEST  12-26-2012   normal perfusion study/  no ischemia or scar   CESAREAN SECTION  1987   w/  Bilateral Tubal Ligation   CHOLECYSTECTOMY N/A 07/31/2013   Procedure: LAPAROSCOPIC CHOLECYSTECTOMY;  Surgeon: Oneil DELENA Budge, MD;  Location: [facility];  Service: General;  Laterality: N/A;   COLONOSCOPY WITH ESOPHAGOGASTRODUODENOSCOPY (EGD)  04/25/2012   Procedure: COLONOSCOPY WITH ESOPHAGOGASTRODUODENOSCOPY (EGD);  Surgeon: Claudis RAYMOND Rivet, MD;  Location: [facility];  Service: Endoscopy;  Laterality: N/A;  730   COLONOSCOPY WITH PROPOFOL  N/A 05/07/2023   Procedure: COLONOSCOPY WITH PROPOFOL ;  Surgeon: Eartha Angelia Sieving, MD;  Location: [facility];  Service: Gastroenterology;  Laterality: N/A;  100PM, ASA 3   CYSTOSCOPY W/ URETERAL STENT  PLACEMENT Left 08/08/2015   Procedure: LEFT STENT REPLACEMENT;  Surgeon: Belvie LITTIE Clara, MD;  Location: [facility];  Service: Urology;  Laterality: Left;   CYSTOSCOPY W/ URETERAL STENT PLACEMENT Left 03/21/2023   Procedure: CYSTOSCOPY WITH RETROGRADE PYELOGRAM/URETERAL STENT PLACEMENT;  Surgeon: Clara Belvie LITTIE, MD;  Location: [facility];  Service: Urology;  Laterality: Left;   CYSTOSCOPY WITH RETROGRADE PYELOGRAM, URETEROSCOPY AND STENT PLACEMENT Left 07/25/2015   Procedure: CYSTOSCOPY WITH LEFT RETROGRADE,;  Surgeon: Belvie LITTIE Clara, MD;  Location: [facility];  Service: Urology;  Laterality: Left;   CYSTOSCOPY WITH RETROGRADE PYELOGRAM, URETEROSCOPY AND STENT PLACEMENT Left 04/18/2023   Procedure: CYSTOSCOPY WITH RETROGRADE PYELOGRAM, URETEROSCOPY AND STENT PLACEMENT- stent exchange;  Surgeon: Clara Belvie LITTIE, MD;  Location: [facility];  Service: Urology;  Laterality: Left;   CYSTOSCOPY/RETROGRADE/URETEROSCOPY/STONE EXTRACTION WITH BASKET Left 08/08/2015   Procedure: CYSTOSCOPY LEFT RETROGRADE/URETEROSCOPY/STONE EXTRACTION WITH BASKET;  Surgeon: Belvie LITTIE Clara, MD;  Location: [facility];  Service: Urology;  Laterality: Left;   DILATION AND CURETTAGE OF UTERUS  1986   HOLMIUM LASER APPLICATION Left 07/25/2015   Procedure: LEFT HOLMIUM LASER STONE EXTRACTION  AND LEFT STENT PLACEMENT;  Surgeon: Belvie LITTIE Clara, MD;  Location: [facility];  Service: Urology;  Laterality: Left;   HOLMIUM LASER APPLICATION Left 08/08/2015  Procedure: HOLMIUM LASER APPLICATION;  Surgeon: Belvie LITTIE Clara, MD;  Location: [facility];  Service: Urology;  Laterality: Left;   HOLMIUM LASER APPLICATION Left 04/18/2023   Procedure: HOLMIUM LASER APPLICATION;  Surgeon: Clara Belvie LITTIE, MD;  Location: [facility];  Service: Urology;  Laterality: Left;   LAPAROSCOPIC GASTRIC SLEEVE RESECTION WITH HIATAL HERNIA REPAIR N/A 05/14/2016   Procedure: LAPAROSCOPIC GASTRIC SLEEVE RESECTION  WITH HIATAL HERNIA REPAIR;  Surgeon: Donnice Lunger, MD;  Location: [facility];  Service: General;  Laterality: N/A;   LIVER BIOPSY N/A 07/31/2013   Procedure: LIVER BIOPSY;  Surgeon: Oneil DELENA Budge, MD;  Location: [facility];  Service: General;  Laterality: N/A;   MALONEY DILATION  04/25/2012   Procedure: AGAPITO DILATION;  Surgeon: Claudis RAYMOND Rivet, MD;  Location: [facility];  Service: Endoscopy;  Laterality: N/A;   STONE EXTRACTION WITH BASKET  04/18/2023   Procedure: STONE EXTRACTION WITH BASKET;  Surgeon: Clara Belvie LITTIE, MD;  Location: [facility];  Service: Urology;;   TONSILLECTOMY  1967   TRANSTHORACIC ECHOCARDIOGRAM  12-26-2012   mild LVH,  ef 55-60%,  grade 1 diastolic dysfunction   TUBAL LIGATION      Home Medications:  Allergies as of 12/18/2023       Reactions   Bee Venom Swelling   Macrobid [nitrofurantoin] Hives, Rash        Medication List        Accurate as of December 18, 2023  9:41 AM. If you have any questions, ask your nurse or doctor.          acetaminophen  500 MG tablet Commonly known as: TYLENOL  Take 1,000 mg by mouth every 6 (six) hours as needed for headache.   albuterol  108 (90 Base) MCG/ACT inhaler Commonly known as: VENTOLIN  HFA INHALE 1-2 puffs INTO THE LUNGS EVERY 6 HOURS AS NEEDED FOR WHEEZING OR SHORTNESS OF BREATH   busPIRone  5 MG tablet Commonly known as: BUSPAR  Take 1 tablet (5 mg total) by mouth 2 (two) times daily.   Gemtesa  75 MG Tabs Generic drug: Vibegron  Take 1 tablet (75 mg total) by mouth daily.   torsemide  10 MG tablet Commonly known as: DEMADEX  Take 1 tablet (10 mg total) by mouth daily as needed. What changed:  how much to take when to take this   Vitamin D  (Ergocalciferol ) 1.25 MG (50000 UNIT) Caps capsule Commonly known as: DRISDOL  Take 1 capsule (50,000 Units total) by mouth 2 (two) times a week.        Allergies:  Allergies  Allergen Reactions   Bee Venom Swelling   Macrobid [Nitrofurantoin] Hives and Rash     Family History: Family History  Problem Relation Age of Onset   Alzheimer's disease Mother    Thyroid  disease Mother    Cancer Mother    Sudden death Mother    Stroke Father    Heart attack Father    Diabetes Father    High blood pressure Father    High Cholesterol Father    Heart disease Father    Alzheimer's disease Sister    Diabetes Brother    Colon cancer Paternal Uncle     Social History:  reports that she quit smoking about 42 years ago. Her smoking use included cigarettes. She started smoking about 46 years ago. She has a 2 pack-year smoking history. She has never used smokeless tobacco. She reports current alcohol use. She reports that she does not use drugs.  ROS: All other review of systems  were reviewed and are negative except what is noted above in HPI  Physical Exam: BP 122/78   Pulse 78   Constitutional:  Alert and oriented, No acute distress. HEENT: [place] AT, moist mucus membranes.  Trachea midline, no masses. Cardiovascular: No clubbing, cyanosis, or edema. Respiratory: Normal respiratory effort, no increased work of breathing. GI: Abdomen is soft, nontender, nondistended, no abdominal masses GU: No CVA tenderness.  Lymph: No cervical or inguinal lymphadenopathy. Skin: No rashes, bruises or suspicious lesions. Neurologic: Grossly intact, no focal deficits, moving all 4 extremities. Psychiatric: Normal mood and affect.  Laboratory Data: Lab Results  Component Value Date   WBC 7.9 03/24/2023   HGB 13.8 03/24/2023   HCT 41.7 03/24/2023   MCV 92.3 03/24/2023   PLT 213 03/24/2023    Lab Results  Component Value Date   CREATININE 0.85 04/24/2023    No results found for: PSA  No results found for: TESTOSTERONE  Lab Results  Component Value Date   HGBA1C 5.5 01/11/2022    Urinalysis    Component Value Date/Time   COLORURINE YELLOW 03/20/2023 0013   APPEARANCEUR Clear 06/19/2023 1136   LABSPEC 1.018 03/20/2023 0013   PHURINE 5.0  03/20/2023 0013   GLUCOSEU Negative 06/19/2023 1136   HGBUR MODERATE (A) 03/20/2023 0013   BILIRUBINUR Negative 06/19/2023 1136   KETONESUR NEGATIVE 03/20/2023 0013   PROTEINUR Negative 06/19/2023 1136   PROTEINUR 100 (A) 03/20/2023 0013   NITRITE Negative 06/19/2023 1136   NITRITE NEGATIVE 03/20/2023 0013   LEUKOCYTESUR Trace (A) 06/19/2023 1136   LEUKOCYTESUR MODERATE (A) 03/20/2023 0013    Lab Results  Component Value Date   LABMICR See below: 06/19/2023   WBCUA 6-10 (A) 06/19/2023   LABEPIT 0-10 06/19/2023   BACTERIA None seen 06/19/2023    Pertinent Imaging: Renal US  12/11/2023: Images reviewed and discussed with the patient Results for orders placed during the hospital encounter of 07/23/15  DG Abd 1 View  Narrative CLINICAL DATA:  Left-sided abdominal pain 2-3 days. Known left ureteral stone.  EXAM: ABDOMEN - 1 VIEW  COMPARISON:  07/12/2015  FINDINGS: Bowel gas pattern is nonobstructive. There is been fragmentation of patient's large proximal left ureteral stone with moderate residual fragments over the proximal ureter. Suggestion of small residual fragments over the distal left ureter. Small stones over the lower pole left kidney. Minimal degenerate change of the spine and hips.  IMPRESSION: Left nephrolithiasis. Interval fragmentation of patient's large proximal left ureteral stone with several prominent residual fragments over the approximate ureter as well small fragments over the distal ureter.  Nonobstructive bowel gas pattern.   Electronically Signed By: Toribio Agreste M.D. On: 07/23/2015 21:12  No results found for this or any previous visit.  No results found for this or any previous visit.  No results found for this or any previous visit.  Results for orders placed during the hospital encounter of 12/11/23  US  RENAL  Narrative CLINICAL DATA:  Nephrolithiasis.  EXAM: RENAL / URINARY TRACT ULTRASOUND COMPLETE  COMPARISON:  May 31, 2023  FINDINGS: Right Kidney:  Renal measurements: 10.0 cm x 5.0 cm x 5.1 cm = volume: 135.9 mL. Limited in visualization. Echogenicity within normal limits. No mass is visualized. There is mild right-sided hydronephrosis.  Left Kidney:  Renal measurements: 9.9 cm x 5.8 cm x 5.0 cm = volume: 152.6 mL. Limited in visualization. Echogenicity within normal limits. No mass or hydronephrosis visualized.  Bladder:  Appears normal for degree of bladder distention. The left ureteral  jet is visualized.  Other:  None.  IMPRESSION: Mild right-sided hydronephrosis.   Electronically Signed By: Suzen Dials M.D. On: 12/13/2023 22:05  No results found for this or any previous visit.  No results found for this or any previous visit.  Results for orders placed during the hospital encounter of 06/22/15  CT RENAL STONE STUDY  Narrative CLINICAL DATA:  RIGHT side back pain with pain down RIGHT leg since 05/29/2015, history kidney stones, abnormal radiograph  EXAM: CT ABDOMEN AND PELVIS WITHOUT CONTRAST  TECHNIQUE: Multidetector CT imaging of the abdomen and pelvis was performed following the standard protocol without IV contrast. Sagittal and coronal MPR images reconstructed from axial data set. Oral contrast not administered for this indication.  COMPARISON:  01/25/2004; correlation lumbar spine radiographs 06/16/2015  FINDINGS: Lung bases clear.  14 x 13 x 21 mm diameter calculus at LEFT ureteropelvic junction with moderate LEFT hydronephrosis.  Additional tiny nonobstructing LEFT renal calculi.  Ureters and bladder normal appearance.  Within limits of a nonenhanced exam no focal abnormalities of the liver, spleen, pancreas, RIGHT kidney, or adrenal glands.  Gallbladder surgically absent.  Normal appendix, uterus, and adnexa.  Stomach and bowel loops normal appearance.  Umbilical hernia containing fat.  No mass, adenopathy, free air, free fluid, or  inflammatory process.  Bones unremarkable.  No definite lumbar disc herniation or neural compression.  IMPRESSION: Large 14 x 13 x 21 mm LEFT UPJ calculus with moderate LEFT hydronephrosis.  Additional tiny nonobstructing LEFT renal calculi.  Umbilical hernia containing fat.   Electronically Signed By: Oneil Kiss M.D. On: 06/22/2015 16:41   Assessment & Plan:    1. Nephrolithiasis (Primary) STAT CT stone study  2. OAB (overactive bladder) -we will trial mirabegron  25mg .    No follow-ups on file.  Belvie Clara, MD  [facility] [place]

## 2023-12-24 ENCOUNTER — Encounter: Payer: Self-pay | Admitting: Urology

## 2023-12-24 NOTE — Patient Instructions (Signed)

## 2023-12-30 ENCOUNTER — Other Ambulatory Visit (HOSPITAL_COMMUNITY): Payer: Self-pay

## 2024-03-18 ENCOUNTER — Encounter (INDEPENDENT_AMBULATORY_CARE_PROVIDER_SITE_OTHER): Payer: Self-pay | Admitting: Gastroenterology

## 2024-06-23 ENCOUNTER — Ambulatory Visit (HOSPITAL_COMMUNITY): Admission: RE | Admit: 2024-06-23 | Payer: Self-pay

## 2024-06-23 ENCOUNTER — Encounter (HOSPITAL_COMMUNITY): Payer: Self-pay

## 2024-07-02 ENCOUNTER — Ambulatory Visit (HOSPITAL_COMMUNITY)
Admission: RE | Admit: 2024-07-02 | Discharge: 2024-07-02 | Disposition: A | Discharge status: Home / Self Care | Source: Ambulatory Visit | Attending: Urology | Admitting: Urology

## 2024-07-02 DIAGNOSIS — N200 Calculus of kidney: Secondary | ICD-10-CM | POA: Insufficient documentation

## 2025-01-01 ENCOUNTER — Ambulatory Visit: Admitting: Urology
